# Patient Record
Sex: Female | Born: 1950 | Race: White | Hispanic: No | Marital: Married | Smoking: Former smoker
Health system: Southern US, Community
[De-identification: ages and names within clinical notes are randomized; demographics above are authoritative.]

## PROBLEM LIST (undated history)

## (undated) DIAGNOSIS — I10 Essential (primary) hypertension: Secondary | ICD-10-CM

## (undated) DIAGNOSIS — R079 Chest pain, unspecified: Secondary | ICD-10-CM

## (undated) DIAGNOSIS — I519 Heart disease, unspecified: Secondary | ICD-10-CM

## (undated) DIAGNOSIS — Z789 Other specified health status: Secondary | ICD-10-CM

## (undated) DIAGNOSIS — Q211 Atrial septal defect, unspecified: Secondary | ICD-10-CM

## (undated) DIAGNOSIS — F329 Major depressive disorder, single episode, unspecified: Secondary | ICD-10-CM

## (undated) DIAGNOSIS — F419 Anxiety disorder, unspecified: Secondary | ICD-10-CM

## (undated) DIAGNOSIS — E079 Disorder of thyroid, unspecified: Secondary | ICD-10-CM

## (undated) HISTORY — DX: Essential (primary) hypertension: I10

## (undated) HISTORY — DX: Disorder of thyroid, unspecified: E07.9

## (undated) HISTORY — PX: EXPLORATION POST OPERATIVE OPEN HEART: SHX5061

## (undated) HISTORY — PX: COLONOSCOPY WITH PROPOFOL: SHX5780

## (undated) HISTORY — DX: Major depressive disorder, single episode, unspecified: F32.9

## (undated) HISTORY — DX: Anxiety disorder, unspecified: F41.9

## (undated) HISTORY — DX: Atrial septal defect: Q21.1

## (undated) HISTORY — DX: Atrial septal defect, unspecified: Q21.10

## (undated) HISTORY — DX: Heart disease, unspecified: I51.9

## (undated) HISTORY — DX: Other specified health status: Z78.9

## (undated) HISTORY — DX: Chest pain, unspecified: R07.9

## (undated) HISTORY — PX: ATRIAL SEPTAL DEFECT(ASD) CLOSURE: CATH118299

---

## 2014-01-30 ENCOUNTER — Ambulatory Visit: Payer: Self-pay | Admitting: Family Medicine

## 2014-10-30 ENCOUNTER — Ambulatory Visit: Payer: Self-pay | Admitting: Cardiovascular Disease

## 2015-10-08 ENCOUNTER — Telehealth: Payer: Self-pay | Admitting: Family Medicine

## 2015-10-08 MED ORDER — LEVOTHYROXINE SODIUM 50 MCG PO TABS: 50.0000 ug | ORAL_TABLET | Freq: Every day | ORAL | Status: DC

## 2015-10-08 NOTE — Telephone Encounter (Signed)
Pt.called need a refile on Levothyroxine  50 tablet   [facility] [place]  Pt call back # is   [phone]

## 2015-10-08 NOTE — Telephone Encounter (Signed)
Last office visit 10/02/14 patient requesting refill on thyroid med.

## 2015-10-08 NOTE — Telephone Encounter (Signed)
Filled for 1 mos. Pt needs f/u appt for further refills. Thanks! AK

## 2015-11-25 ENCOUNTER — Ambulatory Visit (INDEPENDENT_AMBULATORY_CARE_PROVIDER_SITE_OTHER): Payer: BC Managed Care – PPO | Admitting: Family Medicine

## 2015-11-25 ENCOUNTER — Encounter: Payer: Self-pay | Admitting: Family Medicine

## 2015-11-25 ENCOUNTER — Other Ambulatory Visit: Payer: Self-pay | Admitting: Family Medicine

## 2015-11-25 VITALS — BP 152/86 | HR 64 | Temp 98.4°F | Resp 16 | Ht 67.0 in | Wt 224.6 lb

## 2015-11-25 DIAGNOSIS — Z124 Encounter for screening for malignant neoplasm of cervix: Secondary | ICD-10-CM | POA: Diagnosis not present

## 2015-11-25 DIAGNOSIS — F419 Anxiety disorder, unspecified: Secondary | ICD-10-CM

## 2015-11-25 DIAGNOSIS — E66812 Obesity, class 2: Secondary | ICD-10-CM | POA: Insufficient documentation

## 2015-11-25 DIAGNOSIS — E66811 Obesity, class 1: Secondary | ICD-10-CM | POA: Insufficient documentation

## 2015-11-25 DIAGNOSIS — I10 Essential (primary) hypertension: Secondary | ICD-10-CM | POA: Insufficient documentation

## 2015-11-25 DIAGNOSIS — R350 Frequency of micturition: Secondary | ICD-10-CM

## 2015-11-25 DIAGNOSIS — E785 Hyperlipidemia, unspecified: Secondary | ICD-10-CM | POA: Diagnosis not present

## 2015-11-25 DIAGNOSIS — Z01419 Encounter for gynecological examination (general) (routine) without abnormal findings: Secondary | ICD-10-CM | POA: Diagnosis not present

## 2015-11-25 DIAGNOSIS — F329 Major depressive disorder, single episode, unspecified: Secondary | ICD-10-CM | POA: Insufficient documentation

## 2015-11-25 DIAGNOSIS — E034 Atrophy of thyroid (acquired): Secondary | ICD-10-CM | POA: Diagnosis not present

## 2015-11-25 DIAGNOSIS — R030 Elevated blood-pressure reading, without diagnosis of hypertension: Secondary | ICD-10-CM

## 2015-11-25 DIAGNOSIS — Z789 Other specified health status: Secondary | ICD-10-CM | POA: Insufficient documentation

## 2015-11-25 DIAGNOSIS — E6609 Other obesity due to excess calories: Secondary | ICD-10-CM | POA: Insufficient documentation

## 2015-11-25 DIAGNOSIS — F32A Depression, unspecified: Secondary | ICD-10-CM

## 2015-11-25 DIAGNOSIS — Z1331 Encounter for screening for depression: Secondary | ICD-10-CM | POA: Insufficient documentation

## 2015-11-25 DIAGNOSIS — Z6836 Body mass index (BMI) 36.0-36.9, adult: Secondary | ICD-10-CM | POA: Insufficient documentation

## 2015-11-25 DIAGNOSIS — Z87898 Personal history of other specified conditions: Secondary | ICD-10-CM | POA: Insufficient documentation

## 2015-11-25 DIAGNOSIS — Z1239 Encounter for other screening for malignant neoplasm of breast: Secondary | ICD-10-CM | POA: Diagnosis not present

## 2015-11-25 DIAGNOSIS — J40 Bronchitis, not specified as acute or chronic: Secondary | ICD-10-CM | POA: Insufficient documentation

## 2015-11-25 DIAGNOSIS — IMO0001 Reserved for inherently not codable concepts without codable children: Secondary | ICD-10-CM

## 2015-11-25 DIAGNOSIS — J029 Acute pharyngitis, unspecified: Secondary | ICD-10-CM | POA: Insufficient documentation

## 2015-11-25 DIAGNOSIS — E038 Other specified hypothyroidism: Secondary | ICD-10-CM

## 2015-11-25 DIAGNOSIS — E039 Hypothyroidism, unspecified: Secondary | ICD-10-CM | POA: Insufficient documentation

## 2015-11-25 DIAGNOSIS — J209 Acute bronchitis, unspecified: Secondary | ICD-10-CM | POA: Insufficient documentation

## 2015-11-25 DIAGNOSIS — R55 Syncope and collapse: Secondary | ICD-10-CM | POA: Insufficient documentation

## 2015-11-25 HISTORY — DX: Other specified health status: Z78.9

## 2015-11-25 HISTORY — DX: Anxiety disorder, unspecified: F41.9

## 2015-11-25 HISTORY — DX: Depression, unspecified: F32.A

## 2015-11-25 LAB — POCT URINALYSIS DIPSTICK
Bilirubin, UA: NEGATIVE
Glucose, UA: NEGATIVE
KETONES UA: NEGATIVE
Leukocytes, UA: NEGATIVE
Nitrite, UA: NEGATIVE
PH UA: 5
PROTEIN UA: NEGATIVE
RBC UA: NEGATIVE
SPEC GRAV UA: 1.01
UROBILINOGEN UA: NEGATIVE

## 2015-11-25 MED ORDER — BUSPIRONE HCL 5 MG PO TABS: ORAL_TABLET | ORAL | Status: DC

## 2015-11-25 MED ORDER — LEVOTHYROXINE SODIUM 50 MCG PO TABS: 50.0000 ug | ORAL_TABLET | Freq: Every day | ORAL | Status: DC

## 2015-11-25 NOTE — Assessment & Plan Note (Signed)
Bp elevated today and at home. Discussed starting medication today vs. Getting anxiety under control and rechecking in 1 mos. Pt would like to try to control anxiety. Monitor closely at home. Alarm symptoms reviewed. Recheck 1 mos.  Check CMP.

## 2015-11-25 NOTE — Assessment & Plan Note (Signed)
Discussed starting a medication- pt would like to try buspar.Reviewed side effects and risks vs benefits.  Check TSH and vitamin D. Encouraged daily exercise. Recheck 1 mos.

## 2015-11-25 NOTE — Patient Instructions (Signed)
Check your blood pressure at home: Your goal blood pressure is 140/90 Work on low salt/sodium diet - goal <1.5gm (1,500mg ) per day. Eat a diet high in fruits/vegetables and whole grains.  Look into mediterranean and DASH diet. Goal activity is 1950min/wk of moderate intensity exercise.  This can be split into 30 minute chunks.  If you are not at this level, you can start with smaller 10-15 min increments and slowly build up activity. Look at www.heart.org for more resources  Please seek immediate medical attention at ER or Urgent Care if you develop: Chest pain, pressure or tightness. Shortness of breath accompanied by nausea or diaphoresis Visual changes Numbness or tingling on one side of the body Facial droop Altered mental status Or any concerning symptoms.

## 2015-11-25 NOTE — Progress Notes (Signed)
Subjective:    Patient ID: Meghan Cole, female    DOB: Dec 19, 1950, 65 y.o.   MRN: [id number]  HPI: Meghan RiisDana Tipping is a 65 y.o. female presenting on 11/25/2015 for Annual Exam   HPI  Pt presents for annual exam. Needs her thyroid renewed. Is worried about her anxiety- is more than it used to be. Feels she eats because she is bored. Will need pap smear. Has goals to lose weight.  Mammogram- needs to have done.  BP has been running high at home. Checks it at home; avg 150/80's. Is having HA at home. No CP. No SOB. No dizziness. Walks daily at the park. Would like to try and lose some weight- thinks it is anxiety related.  Pt is also concerned she might have a UTI today. No dysuria. Some back pain. Some frequency. No blood or foul smell to urine.   Past Medical History  Diagnosis Date  . Thyroid disease   . Heart disease    Social History   Social History  . Marital Status: Married    Spouse Name: N/A  . Number of Children: N/A  . Years of Education: N/A   Occupational History  . Not on file.   Social History Main Topics  . Smoking status: Never Smoker   . Smokeless tobacco: Not on file  . Alcohol Use: No  . Drug Use: No  . Sexual Activity: Not on file   Other Topics Concern  . Not on file   Social History Narrative  . No narrative on file   Family History  Problem Relation Age of Onset  . Cancer Mother     melanoma  . Heart disease Father    No current outpatient prescriptions on file prior to visit.   No current facility-administered medications on file prior to visit.    Review of Systems  Constitutional: Negative for fever and chills.  Respiratory: Negative for cough, chest tightness and wheezing.   Cardiovascular: Negative for chest pain and leg swelling.  Gastrointestinal: Negative for nausea, vomiting, abdominal pain, diarrhea and constipation.  Endocrine: Negative.  Negative for cold intolerance, heat intolerance, polydipsia, polyphagia and polyuria.    Genitourinary: Negative for dysuria, vaginal bleeding, vaginal discharge, difficulty urinating and vaginal pain.  Musculoskeletal: Negative.   Neurological: Positive for headaches. Negative for dizziness, light-headedness and numbness.  Psychiatric/Behavioral: Negative for suicidal ideas. The patient is nervous/anxious.    Per HPI unless specifically indicated above     Objective:    BP 152/86 mmHg  Pulse 64  Temp(Src) 98.4 F (36.9 C) (Oral)  Resp 16  Ht 5\' 7"  (1.702 m)  Wt 224 lb 9.6 oz (101.878 kg)  BMI 35.17 kg/m2  Wt Readings from Last 3 Encounters:  11/25/15 224 lb 9.6 oz (101.878 kg)    GAD 7 : Generalized Anxiety Score 11/25/2015  Nervous, Anxious, on Edge 2  Control/stop worrying 1  Worry too much - different things 1  Trouble relaxing 0  Restless 1  Easily annoyed or irritable 2  Afraid - awful might happen 2  Total GAD 7 Score 9  Anxiety Difficulty Somewhat difficult      Physical Exam  Constitutional: She is oriented to person, place, and time. She appears well-developed and well-nourished.  HENT:  Head: Normocephalic and atraumatic.  Neck: Neck supple.  Cardiovascular: Normal rate, regular rhythm and normal heart sounds.  Exam reveals no gallop and no friction rub.   No murmur heard. Pulmonary/Chest: Effort normal and breath sounds  normal. She has no wheezes. She exhibits no tenderness.  Abdominal: Soft. Normal appearance and bowel sounds are normal. She exhibits no distension and no mass. There is tenderness (mild to deep palpation.) in the right upper quadrant and right lower quadrant. There is no rebound, no guarding and no CVA tenderness.  Genitourinary: Vagina normal and uterus normal. No breast swelling, tenderness, discharge or bleeding. No labial fusion. There is no tenderness or lesion on the right labia. There is no tenderness or lesion on the left labia. Cervix exhibits no motion tenderness, no discharge and no friability. Right adnexum displays no  tenderness and no fullness. Left adnexum displays no tenderness and no fullness. No erythema or tenderness in the vagina. No vaginal discharge found.  Musculoskeletal: Normal range of motion. She exhibits no edema or tenderness.  Lymphadenopathy:    She has no cervical adenopathy.  Neurological: She is alert and oriented to person, place, and time.  Skin: Skin is warm and dry.  Psychiatric: She has a normal mood and affect. Her speech is normal and behavior is normal. Judgment and thought content normal. Cognition and memory are normal. She expresses no suicidal ideation. She expresses no suicidal plans.   No results found for this or any previous visit.    Assessment & Plan:   Problem List Items Addressed This Visit      Endocrine   Adult hypothyroidism    Check TSH. Renewed medications.       Relevant Medications   levothyroxine (SYNTHROID, LEVOTHROID) 50 MCG tablet   Other Relevant Orders   TSH   Comprehensive metabolic panel     Other   Anxiety    Discussed starting a medication- pt would like to try buspar.Reviewed side effects and risks vs benefits.  Check TSH and vitamin D. Encouraged daily exercise. Recheck 1 mos.       Relevant Medications   busPIRone (BUSPAR) 5 MG tablet   Other Relevant Orders   Vitamin D 1,25 dihydroxy   Elevated BP    Bp elevated today and at home. Discussed starting medication today vs. Getting anxiety under control and rechecking in 1 mos. Pt would like to try to control anxiety. Monitor closely at home. Alarm symptoms reviewed. Recheck 1 mos.  Check CMP.        Other Visit Diagnoses    Well woman exam with routine gynecological exam    -  Primary    Pap done. Health maintenance reviewed.     Relevant Orders    Pap IG and HPV (high risk) DNA detection    Mild hyperlipidemia        Relevant Orders    Lipid panel    Screening for breast cancer        Relevant Orders    MM Digital Screening    Screening for cervical cancer         Relevant Orders    Pap IG and HPV (high risk) DNA detection    Urinary frequency        UA clear. Likely post menopausal changes. Alarm symptoms reviewed wiht patient. Avoid caffeine. Return if not improving.     Relevant Orders    POCT urinalysis dipstick       Meds ordered this encounter  Medications  . Cranberry, Vacc oxycoccus, 200 MG CAPS    Sig: Take by mouth.  . Melatonin CR 3 MG TBCR    Sig: Take by mouth.  Satira Sark Johns Wort 1000 MG CAPS  Sig: Take by mouth daily.  Marland Kitchen levothyroxine (SYNTHROID, LEVOTHROID) 50 MCG tablet    Sig: Take 1 tablet (50 mcg total) by mouth daily.    Dispense:  90 tablet    Refill:  3    Pt needs appt for further refills.    Order Specific Question:  Supervising Provider    Answer:  Janeann Forehand [[id number]]  . busPIRone (BUSPAR) 5 MG tablet    Sig: Take 1 tablet by mouth once daily for 1 week and then increase to 1 tablet twice daily.    Dispense:  60 tablet    Refill:  11    Order Specific Question:  Supervising Provider    Answer:  Janeann Forehand [phone]      Follow up plan: Return in about 4 weeks (around 12/23/2015) for Bp check. Marland Kitchen

## 2015-11-25 NOTE — Assessment & Plan Note (Signed)
Check TSH. Renewed medications.

## 2015-11-26 LAB — PAP IG AND HPV HIGH-RISK: HPV DNA HIGH RISK: NOT DETECTED

## 2015-12-03 LAB — TSH: TSH: 1.08 u[IU]/mL (ref 0.450–4.500)

## 2015-12-03 LAB — COMPREHENSIVE METABOLIC PANEL
A/G RATIO: 1.5 (ref 1.2–2.2)
ALK PHOS: 63 IU/L (ref 39–117)
ALT: 55 IU/L — AB (ref 0–32)
AST: 60 IU/L — ABNORMAL HIGH (ref 0–40)
Albumin: 4.3 g/dL (ref 3.6–4.8)
BILIRUBIN TOTAL: 0.8 mg/dL (ref 0.0–1.2)
BUN/Creatinine Ratio: 17 (ref 12–28)
BUN: 12 mg/dL (ref 8–27)
CHLORIDE: 99 mmol/L (ref 96–106)
CO2: 24 mmol/L (ref 18–29)
Calcium: 9.9 mg/dL (ref 8.7–10.3)
Creatinine, Ser: 0.71 mg/dL (ref 0.57–1.00)
GFR calc non Af Amer: 92 mL/min/{1.73_m2} (ref >59)
GFR, EST AFRICAN AMERICAN: 106 mL/min/{1.73_m2} (ref >59)
GLUCOSE: 82 mg/dL (ref 65–99)
Globulin, Total: 2.9 g/dL (ref 1.5–4.5)
POTASSIUM: 4.9 mmol/L (ref 3.5–5.2)
Sodium: 140 mmol/L (ref 134–144)
TOTAL PROTEIN: 7.2 g/dL (ref 6.0–8.5)

## 2015-12-03 LAB — LIPID PANEL
CHOL/HDL RATIO: 2.2 ratio (ref 0.0–4.4)
Cholesterol, Total: 199 mg/dL (ref 100–199)
HDL: 89 mg/dL (ref >39)
LDL Calculated: 100 mg/dL — ABNORMAL HIGH (ref 0–99)
Triglycerides: 52 mg/dL (ref 0–149)
VLDL Cholesterol Cal: 10 mg/dL (ref 5–40)

## 2015-12-03 LAB — VITAMIN D 1,25 DIHYDROXY
VITAMIN D 1, 25 (OH) TOTAL: 46 pg/mL
VITAMIN D3 1, 25 (OH): 46 pg/mL

## 2015-12-11 ENCOUNTER — Ambulatory Visit
Admission: RE | Admit: 2015-12-11 | Discharge: 2015-12-11 | Disposition: A | Discharge status: Home / Self Care | Payer: BC Managed Care – PPO | Source: Ambulatory Visit | Attending: Family Medicine | Admitting: Family Medicine

## 2015-12-11 DIAGNOSIS — Z1231 Encounter for screening mammogram for malignant neoplasm of breast: Secondary | ICD-10-CM | POA: Insufficient documentation

## 2015-12-11 DIAGNOSIS — Z1239 Encounter for other screening for malignant neoplasm of breast: Secondary | ICD-10-CM

## 2015-12-23 ENCOUNTER — Encounter: Payer: Self-pay | Admitting: Family Medicine

## 2015-12-23 ENCOUNTER — Ambulatory Visit (INDEPENDENT_AMBULATORY_CARE_PROVIDER_SITE_OTHER): Payer: BC Managed Care – PPO | Admitting: Family Medicine

## 2015-12-23 VITALS — BP 140/80 | HR 72 | Temp 98.0°F | Resp 16 | Ht 67.0 in | Wt 222.0 lb

## 2015-12-23 DIAGNOSIS — IMO0001 Reserved for inherently not codable concepts without codable children: Secondary | ICD-10-CM

## 2015-12-23 DIAGNOSIS — E034 Atrophy of thyroid (acquired): Secondary | ICD-10-CM | POA: Diagnosis not present

## 2015-12-23 DIAGNOSIS — E038 Other specified hypothyroidism: Secondary | ICD-10-CM | POA: Diagnosis not present

## 2015-12-23 DIAGNOSIS — R748 Abnormal levels of other serum enzymes: Secondary | ICD-10-CM

## 2015-12-23 DIAGNOSIS — R030 Elevated blood-pressure reading, without diagnosis of hypertension: Secondary | ICD-10-CM

## 2015-12-23 DIAGNOSIS — F419 Anxiety disorder, unspecified: Secondary | ICD-10-CM

## 2015-12-23 NOTE — Addendum Note (Signed)
Addended by: Alease FrameARTER, Jenavi Beedle S on: 12/23/2015 12:07 PM   Modules accepted: Orders

## 2015-12-23 NOTE — Progress Notes (Signed)
Name: Meghan Cole   MRN: [id number]    DOB: 1950-08-03   Date:12/23/2015       Progress Note  Subjective  Chief Complaint  Chief Complaint  Patient presents with  . Hypertension    HPI Here to f/u BP.  It was elevated last visit.  BP has reduced to 135/75 range   She is walking in AM and taking an OTC herbal anxiety combination and seems to be handling stress well at this time./ No problem-specific assessment & plan notes found for this encounter.   Past Medical History  Diagnosis Date  . Thyroid disease   . Heart disease     Past Surgical History  Procedure Laterality Date  . Exploration post operative open heart      Family History  Problem Relation Age of Onset  . Cancer Mother     melanoma  . Heart disease Father   . Breast cancer Paternal Grandmother 79    Social History   Social History  . Marital Status: Married    Spouse Name: N/A  . Number of Children: N/A  . Years of Education: N/A   Occupational History  . Not on file.   Social History Main Topics  . Smoking status: Never Smoker   . Smokeless tobacco: Never Used  . Alcohol Use: No  . Drug Use: No  . Sexual Activity: Not on file   Other Topics Concern  . Not on file   Social History Narrative     Current outpatient prescriptions:  .  levothyroxine (SYNTHROID, LEVOTHROID) 50 MCG tablet, Take 50 mcg by mouth daily before breakfast., Disp: , Rfl:  .  Melatonin CR 3 MG TBCR, Take by mouth., Disp: , Rfl:  .  St Johns Wort 1000 MG CAPS, Take by mouth daily., Disp: , Rfl:   Not on File   Review of Systems  Constitutional: Negative for fever, chills, weight loss and malaise/fatigue.  HENT: Negative for hearing loss.   Eyes: Negative for blurred vision and double vision.  Respiratory: Negative for cough, shortness of breath and wheezing.   Cardiovascular: Positive for palpitations (once about 1 week ago). Negative for chest pain and leg swelling.  Gastrointestinal: Negative for heartburn,  nausea, abdominal pain and blood in stool.  Genitourinary: Negative for dysuria, urgency and frequency.  Musculoskeletal: Negative for myalgias and joint pain.  Skin: Negative for rash.  Neurological: Negative for dizziness, tremors, weakness and headaches.      Objective  Filed Vitals:   12/23/15 1104 12/23/15 1148  BP: 138/79 140/80  Pulse: 72   Temp: 98 F (36.7 C)   TempSrc: Oral   Resp: 16   Height:  (1.702 m)   Weight: 222 lb (100.699 kg)     Physical Exam  Constitutional: She is oriented to person, place, and time and well-developed, well-nourished, and in no distress. No distress.  HENT:  Head: Normocephalic and atraumatic.  Eyes: Conjunctivae and EOM are normal. Pupils are equal, round, and reactive to light. No scleral icterus.  Neck: Normal range of motion. Neck supple. Carotid bruit is not present. No thyromegaly present.  Cardiovascular: Normal rate, regular rhythm and normal heart sounds.  Exam reveals no gallop and no friction rub.   No murmur heard. Pulmonary/Chest: Effort normal and breath sounds normal. No respiratory distress. She has no wheezes. She has no rales.  Abdominal: Soft. Bowel sounds are normal. She exhibits no distension and no mass. There is no tenderness.  Musculoskeletal: She exhibits  no edema.  Lymphadenopathy:    She has no cervical adenopathy.  Neurological: She is alert and oriented to person, place, and time.  Vitals reviewed.      Recent Results (from the past 2160 hour(s))  Pap IG and HPV (high risk) DNA detection     Status: None   Collection Time: 11/25/15 11:09 AM  Result Value Ref Range   HPV DNA High Risk Not Detected     Comment: HIGH RISK HPV types (16,18,31,33,35,39,45,51,52,56,58,59,66,68) were not detected. Other HPV types which cause anogenital lesions may be present. The significance of the other types of HPV in malignant  processes has not been established.                  ** Normal Reference Range: Not  Detected **      HPV High Risk testing performed using the APTIMA HPV mRNA Assay.      Specimen adequacy:      Comment: SATISFACTORY.  Endocervical/transformation zone component present.   FINAL DIAGNOSIS:      Comment: - NEGATIVE FOR INTRAEPITHELIAL LESIONS OR MALIGNANCY.    COMMENTS:      Comment: This Pap test has been evaluated with computer assisted technology. Atrophy is present.    Cytotechnologist:      Comment: JWW, BS CT(ASCP), MLT(ASCP) *  The Pap is a screening test for cervical cancer. It is not a  diagnostic test and is subject to false negative and false positive  results. It is most reliable when a satisfactory sample, regularly  obtained, is submitted with relevant clinical findings and history,  and when the Pap result is evaluated along with historic and current  clinical information.   POCT urinalysis dipstick     Status: Normal   Collection Time: 11/25/15  3:32 PM  Result Value Ref Range   Color, UA amber    Clarity, UA clear    Glucose, UA negative    Bilirubin, UA negative    Ketones, UA negative    Spec Grav, UA 1.010    Blood, UA negative    pH, UA 5.0    Protein, UA negative    Urobilinogen, UA negative    Nitrite, UA negative    Leukocytes, UA Negative Negative  Lipid panel     Status: Abnormal   Collection Time: 11/28/15  8:45 AM  Result Value Ref Range   Cholesterol, Total 199 100 - 199 mg/dL   Triglycerides 52 0 - 149 mg/dL   HDL 89 >16>39 mg/dL   VLDL Cholesterol Cal 10 5 - 40 mg/dL   LDL Calculated 109100 (H) 0 - 99 mg/dL   Chol/HDL Ratio 2.2 0.0 - 4.4 ratio units    Comment:                                   T. Chol/HDL Ratio                                             Men  Women                               1/2 Avg.Risk  3.4    3.3  Avg.Risk  5.0    4.4                                2X Avg.Risk  9.6    7.1                                3X Avg.Risk 23.4   11.0   TSH     Status: None   Collection  Time: 11/28/15  8:45 AM  Result Value Ref Range   TSH 1.080 0.450 - 4.500 uIU/mL  Vitamin D 1,25 dihydroxy     Status: None   Collection Time: 11/28/15  8:45 AM  Result Value Ref Range   Vitamin D 1, 25 (OH)2 Total 46 pg/mL    Comment: Reference Range: Adults: 21 - 65    Vitamin D2 1, 25 (OH)2 <10 pg/mL   Vitamin D3 1, 25 (OH)2 46 pg/mL  Comprehensive metabolic panel     Status: Abnormal   Collection Time: 11/28/15  8:45 AM  Result Value Ref Range   Glucose 82 65 - 99 mg/dL   BUN 12 8 - 27 mg/dL   Creatinine, Ser 1.61 0.57 - 1.00 mg/dL   GFR calc non Af Amer 92 >59 mL/min/1.73   GFR calc Af Amer 106 >59 mL/min/1.73   BUN/Creatinine Ratio 17 12 - 28   Sodium 140 134 - 144 mmol/L   Potassium 4.9 3.5 - 5.2 mmol/L   Chloride 99 96 - 106 mmol/L   CO2 24 18 - 29 mmol/L   Calcium 9.9 8.7 - 10.3 mg/dL   Total Protein 7.2 6.0 - 8.5 g/dL   Albumin 4.3 3.6 - 4.8 g/dL   Globulin, Total 2.9 1.5 - 4.5 g/dL   Albumin/Globulin Ratio 1.5 1.2 - 2.2   Bilirubin Total 0.8 0.0 - 1.2 mg/dL   Alkaline Phosphatase 63 39 - 117 IU/L   AST 60 (H) 0 - 40 IU/L   ALT 55 (H) 0 - 32 IU/L     Assessment & Plan  Problem List Items Addressed This Visit      Endocrine   Adult hypothyroidism   Relevant Medications   levothyroxine (SYNTHROID, LEVOTHROID) 50 MCG tablet     Other   Anxiety   Elevated BP - Primary   Elevated liver enzymes   Relevant Orders   Hepatic function panel      Meds ordered this encounter  Medications  . levothyroxine (SYNTHROID, LEVOTHROID) 50 MCG tablet    Sig: Take 50 mcg by mouth daily before breakfast.   1. Elevated BP -140/80 today RTC-6 months  2. Anxiety   3. Hypothyroidism due to acquired atrophy of thyroid Cont Levothyroxine  4. Elevated liver enzymes  - Hepatic function panel

## 2015-12-24 LAB — HEPATIC FUNCTION PANEL
ALBUMIN: 4.3 g/dL (ref 3.6–4.8)
ALT: 52 IU/L — ABNORMAL HIGH (ref 0–32)
AST: 53 IU/L — AB (ref 0–40)
Alkaline Phosphatase: 64 IU/L (ref 39–117)
BILIRUBIN TOTAL: 0.5 mg/dL (ref 0.0–1.2)
Bilirubin, Direct: 0.18 mg/dL (ref 0.00–0.40)
Total Protein: 7.3 g/dL (ref 6.0–8.5)

## 2015-12-26 LAB — HEPATITIS C ANTIBODY

## 2015-12-26 LAB — SPECIMEN STATUS REPORT

## 2015-12-27 ENCOUNTER — Encounter: Payer: Self-pay | Admitting: Family Medicine

## 2015-12-30 ENCOUNTER — Encounter: Payer: Self-pay | Admitting: Family Medicine

## 2015-12-30 ENCOUNTER — Other Ambulatory Visit: Payer: Self-pay | Admitting: Family Medicine

## 2015-12-30 DIAGNOSIS — R768 Other specified abnormal immunological findings in serum: Secondary | ICD-10-CM

## 2016-01-21 ENCOUNTER — Ambulatory Visit (INDEPENDENT_AMBULATORY_CARE_PROVIDER_SITE_OTHER): Payer: BC Managed Care – PPO | Admitting: Family Medicine

## 2016-01-21 ENCOUNTER — Encounter: Payer: Self-pay | Admitting: Family Medicine

## 2016-01-21 VITALS — BP 133/79 | HR 66 | Temp 98.0°F | Resp 16 | Ht 67.0 in | Wt 219.0 lb

## 2016-01-21 DIAGNOSIS — R350 Frequency of micturition: Secondary | ICD-10-CM | POA: Diagnosis not present

## 2016-01-21 DIAGNOSIS — N3000 Acute cystitis without hematuria: Secondary | ICD-10-CM | POA: Diagnosis not present

## 2016-01-21 MED ORDER — NITROFURANTOIN MONOHYD MACRO 100 MG PO CAPS: 100.0000 mg | ORAL_CAPSULE | Freq: Two times a day (BID) | ORAL | 0 refills | Status: AC

## 2016-01-21 NOTE — Progress Notes (Signed)
Name: Meghan Cole   MRN: [id number]    DOB: [birth year]   Date:01/21/2016       Progress Note  Subjective  Chief Complaint  Chief Complaint  Patient presents with  . Urinary Tract Infection    HPI C/o dysuria, freq., pressure in bladder x  5 dayhs.  Some mild low back pain today.  Some foul odor.  No fever, N, V.  No problem-specific Assessment & Plan notes found for this encounter.   Past Medical History:  Diagnosis Date  . Heart disease   . Thyroid disease     Social History  Substance Use Topics  . Smoking status: Never Smoker  . Smokeless tobacco: Never Used  . Alcohol use No     Current Outpatient Prescriptions:  .  milk thistle 175 MG tablet, Take 175 mg by mouth daily., Disp: , Rfl:  .  Probiotic Product (PROBIOTIC-10) CAPS, Take 1 capsule by mouth daily., Disp: , Rfl:  .  levothyroxine (SYNTHROID, LEVOTHROID) 50 MCG tablet, Take 50 mcg by mouth daily before breakfast., Disp: , Rfl:  .  Melatonin CR 3 MG TBCR, Take by mouth., Disp: , Rfl:   Not on File  Review of Systems  Constitutional: Negative for chills, fever and malaise/fatigue.  Eyes: Negative.   Respiratory: Negative.   Cardiovascular: Negative.   Gastrointestinal: Negative.   Genitourinary: Positive for dysuria, frequency and urgency. Negative for flank pain and hematuria.  Musculoskeletal: Negative.   Skin: Negative.   Neurological: Negative.  Negative for weakness.      Objective  Vitals:   01/21/16 0957  BP: 133/79  Pulse: 66  Resp: 16  Temp: 98 F (36.7 C)  TempSrc: Oral  Weight: 219 lb (99.3 kg)  Height: 5\' 7"  (1.702 m)     Physical Exam  Constitutional: She is oriented to person, place, and time. No distress.  HENT:  Head: Normocephalic and atraumatic.  Cardiovascular: Normal rate, regular rhythm and normal heart sounds.   Pulmonary/Chest: Effort normal.  Abdominal: Bowel sounds are normal. She exhibits no distension and no mass. There is no tenderness.  No C VA  tenderness  Musculoskeletal: She exhibits no edema.  Neurological: She is oriented to person, place, and time.  Vitals reviewed.     Recent Results (from the past 2160 hour(s))  Pap IG and HPV (high risk) DNA detection     Status: None   Collection Time: 11/25/15 11:09 AM  Result Value Ref Range   HPV DNA High Risk Not Detected     Comment: HIGH RISK HPV types (16,18,31,33,35,39,45,51,52,56,58,59,66,68) were not detected. Other HPV types which cause anogenital lesions may be present. The significance of the other types of HPV in malignant  processes has not been established.                  ** Normal Reference Range: Not Detected **      HPV High Risk testing performed using the APTIMA HPV mRNA Assay.      Specimen adequacy:      Comment: SATISFACTORY.  Endocervical/transformation zone component present.   FINAL DIAGNOSIS:      Comment: - NEGATIVE FOR INTRAEPITHELIAL LESIONS OR MALIGNANCY.    COMMENTS:      Comment: This Pap test has been evaluated with computer assisted technology. Atrophy is present.    Cytotechnologist:      Comment: JWW, BS CT(ASCP), MLT(ASCP) *  The Pap is a screening test for cervical cancer. It is not a  diagnostic  test and is subject to false negative and false positive  results. It is most reliable when a satisfactory sample, regularly  obtained, is submitted with relevant clinical findings and history,  and when the Pap result is evaluated along with historic and current  clinical information.   POCT urinalysis dipstick     Status: Normal   Collection Time: 11/25/15  3:32 PM  Result Value Ref Range   Color, UA amber    Clarity, UA clear    Glucose, UA negative    Bilirubin, UA negative    Ketones, UA negative    Spec Grav, UA 1.010    Blood, UA negative    pH, UA 5.0    Protein, UA negative    Urobilinogen, UA negative    Nitrite, UA negative    Leukocytes, UA Negative Negative  Lipid panel     Status: Abnormal   Collection Time:  11/28/15  8:45 AM  Result Value Ref Range   Cholesterol, Total 199 100 - 199 mg/dL   Triglycerides 52 0 - 149 mg/dL   HDL 89 >95>39 mg/dL   VLDL Cholesterol Cal 10 5 - 40 mg/dL   LDL Calculated 284100 (H) 0 - 99 mg/dL   Chol/HDL Ratio 2.2 0.0 - 4.4 ratio units    Comment:                                   T. Chol/HDL Ratio                                             Men  Women                               1/2 Avg.Risk  3.4    3.3                                   Avg.Risk  5.0    4.4                                2X Avg.Risk  9.6    7.1                                3X Avg.Risk 23.4   11.0   TSH     Status: None   Collection Time: 11/28/15  8:45 AM  Result Value Ref Range   TSH 1.080 0.450 - 4.500 uIU/mL  Vitamin D 1,25 dihydroxy     Status: None   Collection Time: 11/28/15  8:45 AM  Result Value Ref Range   Vitamin D 1, 25 (OH)2 Total 46 pg/mL    Comment: Reference Range: Adults: 21 - 65    Vitamin D2 1, 25 (OH)2 <10 pg/mL   Vitamin D3 1, 25 (OH)2 46 pg/mL  Comprehensive metabolic panel     Status: Abnormal   Collection Time: 11/28/15  8:45 AM  Result Value Ref Range   Glucose 82 65 - 99 mg/dL   BUN 12 8 - 27 mg/dL   Creatinine, Ser 1.320.71  0.57 - 1.00 mg/dL   GFR calc non Af Amer 92 >59 mL/min/1.73   GFR calc Af Amer 106 >59 mL/min/1.73   BUN/Creatinine Ratio 17 12 - 28   Sodium 140 134 - 144 mmol/L   Potassium 4.9 3.5 - 5.2 mmol/L   Chloride 99 96 - 106 mmol/L   CO2 24 18 - 29 mmol/L   Calcium 9.9 8.7 - 10.3 mg/dL   Total Protein 7.2 6.0 - 8.5 g/dL   Albumin 4.3 3.6 - 4.8 g/dL   Globulin, Total 2.9 1.5 - 4.5 g/dL   Albumin/Globulin Ratio 1.5 1.2 - 2.2   Bilirubin Total 0.8 0.0 - 1.2 mg/dL   Alkaline Phosphatase 63 39 - 117 IU/L   AST 60 (H) 0 - 40 IU/L   ALT 55 (H) 0 - 32 IU/L  Hepatic function panel     Status: Abnormal   Collection Time: 12/23/15  2:43 PM  Result Value Ref Range   Total Protein 7.3 6.0 - 8.5 g/dL   Albumin 4.3 3.6 - 4.8 g/dL   Bilirubin Total 0.5  0.0 - 1.2 mg/dL   Bilirubin, Direct 1.61 0.00 - 0.40 mg/dL   Alkaline Phosphatase 64 39 - 117 IU/L   AST 53 (H) 0 - 40 IU/L   ALT 52 (H) 0 - 32 IU/L  Hepatitis C antibody     Status: Abnormal   Collection Time: 12/23/15  2:43 PM  Result Value Ref Range   Hep C Virus Ab >11.0 (H) 0.0 - 0.9 s/co ratio    Comment:                                   Negative:     < 0.8                              Indeterminate: 0.8 - 0.9                                   Positive:     > 0.9  The CDC recommends that a positive HCV antibody result  be followed up with a HCV Nucleic Acid Amplification  test (096045).   Specimen status report     Status: None   Collection Time: 12/23/15  2:43 PM  Result Value Ref Range   specimen status report Comment     Comment: Written Authorization Written Authorization Written Authorization Received. Authorization received from [facility] 12-26-2015 Logged by Frances Furbish      Assessment & Plan  1. Urinary frequency  - Urine Culture - POCT Urinalysis Dipstick  2. Acute cystitis without hematuria  - nitrofurantoin, macrocrystal-monohydrate, (MACROBID) 100 MG capsule; Take 1 capsule (100 mg total) by mouth 2 (two) times daily.  Dispense: 14 capsule; Refill: 0

## 2016-01-22 LAB — URINE CULTURE

## 2016-02-10 ENCOUNTER — Ambulatory Visit: Payer: BC Managed Care – PPO | Admitting: Gastroenterology

## 2016-02-11 ENCOUNTER — Ambulatory Visit (INDEPENDENT_AMBULATORY_CARE_PROVIDER_SITE_OTHER): Payer: BC Managed Care – PPO | Admitting: Gastroenterology

## 2016-02-11 ENCOUNTER — Other Ambulatory Visit: Payer: Self-pay | Admitting: Gastroenterology

## 2016-02-11 ENCOUNTER — Encounter: Payer: Self-pay | Admitting: Gastroenterology

## 2016-02-11 ENCOUNTER — Other Ambulatory Visit: Payer: Self-pay

## 2016-02-11 VITALS — BP 152/85 | HR 73 | Temp 97.9°F | Ht 67.0 in | Wt 219.0 lb

## 2016-02-11 DIAGNOSIS — B182 Chronic viral hepatitis C: Secondary | ICD-10-CM | POA: Diagnosis not present

## 2016-02-11 DIAGNOSIS — R748 Abnormal levels of other serum enzymes: Secondary | ICD-10-CM | POA: Diagnosis not present

## 2016-02-11 DIAGNOSIS — R768 Other specified abnormal immunological findings in serum: Secondary | ICD-10-CM

## 2016-02-11 NOTE — Patient Instructions (Signed)
Please go to the lab and have your labs drawn. We will call you with the details to your ultrasound appointment. Please call us if you have any questions.

## 2016-02-11 NOTE — Progress Notes (Signed)
Gastroenterology Consultation  Referring Provider:     Janeann Forehand., MD Primary Care Physician:  Fidel Levy, MD Primary Gastroenterologist:  Dr. Servando Snare     Reason for Consultation:     Hepatitis C        HPI:   Meghan Cole is a 65 y.o. y/o female referred for consultation & management of Hepatitis C by Dr. Fidel Levy, MD.  This patient was sent to me today for evaluation of a hepatitis C antibody being positive with a history of abnormal liver enzymes.  The patient denies any high risk activities such as IV drug use snorting cocaine or home me tattoos.  She does report that she was given multiple transfusions as a baby when she had cardiac surgery for VSD.  The patient denies any nausea vomiting fevers or chills.  She does report that she has intermittent dyspepsia and some itching around her right upper quadrant.  There is no report of any unexplained weight loss.  The patient states she is married and has children but they have not been tested for hepatitis C.  Past Medical History:  Diagnosis Date  . Heart disease   . Thyroid disease     Past Surgical History:  Procedure Laterality Date  . COLONOSCOPY WITH PROPOFOL     2011  . EXPLORATION POST OPERATIVE OPEN HEART      Prior to Admission medications   Medication Sig Start Date End Date Taking? Authorizing Provider  levothyroxine (SYNTHROID, LEVOTHROID) 50 MCG tablet Take 50 mcg by mouth daily before breakfast.   Yes Historical Provider, MD  Melatonin CR 3 MG TBCR Take by mouth.   Yes Historical Provider, MD  milk thistle 175 MG tablet Take 175 mg by mouth daily.   Yes Historical Provider, MD  Probiotic Product (PROBIOTIC-10) CAPS Take 1 capsule by mouth daily.   Yes Historical Provider, MD    Family History  Problem Relation Age of Onset  . Cancer Mother     melanoma  . Heart disease Father   . Breast cancer Paternal Grandmother 8     Social History  Substance Use Topics  . Smoking status: Never  Smoker  . Smokeless tobacco: Never Used  . Alcohol use No    Allergies as of 02/11/2016  . (No Known Allergies)    Review of Systems:    All systems reviewed and negative except where noted in HPI.   Physical Exam:  BP (!) 152/85 (BP Location: Left Arm, Patient Position: Sitting)   Pulse 73   Temp 97.9 F (36.6 C) (Oral)   Ht 5\' 7"  (1.702 m)   Wt 219 lb (99.3 kg)   BMI 34.30 kg/m  No LMP recorded. Patient is postmenopausal. Psych:  Alert and cooperative. Normal mood and affect. General:   Alert,  Well-developed, well-nourished, pleasant and cooperative in NAD Head:  Normocephalic and atraumatic. Eyes:  Sclera clear, no icterus.   Conjunctiva pink. Ears:  Normal auditory acuity. Nose:  No deformity, discharge, or lesions. Mouth:  No deformity or lesions,oropharynx pink & moist. Neck:  Supple; no masses or thyromegaly. Lungs:  Respirations even and unlabored.  Clear throughout to auscultation.   No wheezes, crackles, or rhonchi. No acute distress. Heart:  Regular rate and rhythm; no murmurs, clicks, rubs, or gallops. Abdomen:  Normal bowel sounds.  No bruits.  Soft, non-tender and non-distended without masses, hepatosplenomegaly or hernias noted.  No guarding or rebound tenderness.  Negative Carnett sign.  Rectal:  Deferred.  Msk:  Symmetrical without gross deformities.  Good, equal movement & strength bilaterally. Pulses:  Normal pulses noted. Extremities:  No clubbing or edema.  No cyanosis. Neurologic:  Alert and oriented x3;  grossly normal neurologically. Skin:  Intact without significant lesions or rashes.  No jaundice. Lymph Nodes:  No significant cervical adenopathy. Psych:  Alert and cooperative. Normal mood and affect.  Imaging Studies: No results found.  Assessment and Plan:   Meghan Cole is a 65 y.o. y/o female Who has a history of a blood transfusion when she was a baby.  She reports that she believes she had multiple blood transfusions at that time.  The  patient has not had any other risk factors for hepatitis C.  The patient will have her labs sent off for hepatitis C genotype and viral load.  She will also be set up for lab work to check for other possible causes of abnormal liver enzymes.  She will also have an ultrasound with a fibrosis scan sent off.  The patient will then be treated according to the results of these tests.  The patient has been explained the plan and agrees with it.   Note: This dictation was prepared with Dragon dictation along with smaller phrase technology. Any transcriptional errors that result from this process are unintentional.

## 2016-02-12 ENCOUNTER — Other Ambulatory Visit: Payer: Self-pay | Admitting: Gastroenterology

## 2016-02-14 ENCOUNTER — Ambulatory Visit
Admission: RE | Admit: 2016-02-14 | Discharge: 2016-02-14 | Disposition: A | Discharge status: Home / Self Care | Payer: BC Managed Care – PPO | Source: Ambulatory Visit | Attending: Gastroenterology | Admitting: Gastroenterology

## 2016-02-14 DIAGNOSIS — R894 Abnormal immunological findings in specimens from other organs, systems and tissues: Secondary | ICD-10-CM | POA: Insufficient documentation

## 2016-02-14 DIAGNOSIS — N9489 Other specified conditions associated with female genital organs and menstrual cycle: Secondary | ICD-10-CM | POA: Insufficient documentation

## 2016-02-14 DIAGNOSIS — R768 Other specified abnormal immunological findings in serum: Secondary | ICD-10-CM

## 2016-02-14 DIAGNOSIS — N281 Cyst of kidney, acquired: Secondary | ICD-10-CM | POA: Insufficient documentation

## 2016-02-14 DIAGNOSIS — R932 Abnormal findings on diagnostic imaging of liver and biliary tract: Secondary | ICD-10-CM | POA: Diagnosis not present

## 2016-02-17 LAB — ALPHA-1-ANTITRYPSIN: A-1 Antitrypsin: 147 mg/dL (ref 90–200)

## 2016-02-17 LAB — HEPATITIS C GENOTYPE

## 2016-02-17 LAB — MITOCHONDRIAL ANTIBODIES: MITOCHONDRIAL AB: 8 U (ref 0.0–20.0)

## 2016-02-17 LAB — HCV REALTIME ABBOTT
HCV log10: 5.726 log10 IU/mL
Hepatitis C Quantitation: 532535 IU/mL

## 2016-02-17 LAB — HEPATITIS A ANTIBODY, TOTAL: HEP A TOTAL AB: POSITIVE — AB

## 2016-02-17 LAB — HEPATITIS B SURFACE ANTIGEN: HEP B S AG: NEGATIVE

## 2016-02-17 LAB — IGG, IGA, IGM
IGM (IMMUNOGLOBULIN M), SRM: 149 mg/dL (ref 26–217)
IgA/Immunoglobulin A, Serum: 108 mg/dL (ref 87–352)
IgG (Immunoglobin G), Serum: 1285 mg/dL (ref 700–1600)

## 2016-02-17 LAB — CERULOPLASMIN: Ceruloplasmin: 26 mg/dL (ref 19.0–39.0)

## 2016-02-17 LAB — ANA: ANA TITER 1: NEGATIVE

## 2016-02-17 LAB — HEPATITIS B SURFACE ANTIBODY,QUALITATIVE: Hep B Surface Ab, Qual: NONREACTIVE

## 2016-02-17 LAB — ANTI-SMOOTH MUSCLE ANTIBODY, IGG: Smooth Muscle Ab: 24 Units — ABNORMAL HIGH (ref 0–19)

## 2016-02-17 LAB — AFP TUMOR MARKER: AFP TUMOR MARKER: 9.4 ng/mL — AB (ref 0.0–8.3)

## 2016-02-21 ENCOUNTER — Telehealth: Payer: Self-pay

## 2016-02-21 NOTE — Telephone Encounter (Signed)
-----   Message from Midge Miniumarren Wohl, MD sent at 02/16/2016  4:41 PM EDT ----- Let the patient know that the fibrosis scan showed minimal fibrosis.

## 2016-02-21 NOTE — Telephone Encounter (Signed)
-----   Message from Midge Miniumarren Wohl, MD sent at 02/16/2016  4:42 PM EDT ----- Let the patient know the Ultrasound showed fatty liver.

## 2016-02-21 NOTE — Telephone Encounter (Signed)
Pt has been notified of US/Tissue elastography and lab results. Pt will be set up for Hep C treatment. Pt is going on a trip and has requested I wait to submit form on 03/19/16.

## 2016-02-21 NOTE — Telephone Encounter (Signed)
-----   Message from Midge Miniumarren Wohl, MD sent at 02/20/2016  7:20 PM EDT ----- The patient know that her Blood work showed her to be immune to hepatitis A but she needs vaccination for hepatitis B. Her tumor marker was slightly elevated and should be checked again in 3 months.  The patient's hepatitis C viral load was positive and she should be treated for her disease.

## 2016-02-21 NOTE — Telephone Encounter (Signed)
-----   Message from Midge Miniumarren Wohl, MD sent at 02/17/2016  1:40 PM EDT ----- Let the patient know that she needs a vaccination for hepatitis B. The patient's alpha-fetoprotein was slightly high and she should have this repeated in one month.

## 2016-02-28 ENCOUNTER — Telehealth: Payer: Self-pay

## 2016-02-28 NOTE — Telephone Encounter (Signed)
Contacted [employer] and was told by customer service rep, Burman Nieves that pt has a $1500.00 deductible of which has not been met. She has a 20% co insurance that she would have to pay out of pocket if her deductible has not been met.   Contacted pt about this information and she stated she has received a letter from [facility] stating the will not cover the tissue elastography. Pt will have her husband scan this letter and email to me so I may contact [place].

## 2016-02-28 NOTE — Telephone Encounter (Signed)
Patient wants to speak with Ginger. [employer] contacted the patient and stated that they will not pay for the Elastography part to her U/S that was ordered to check her liver. Please contact the patient back

## 2016-02-28 NOTE — Telephone Encounter (Signed)
Contacted [employer] Claims review dept and spoke with Marge regarding denial letter pt just received. She stated this claim was still under process as they just received it on 02/19/16. Pt has been notified of this information and will let me know if she receives a bill.

## 2016-03-24 ENCOUNTER — Telehealth: Payer: Self-pay

## 2016-03-24 NOTE — Telephone Encounter (Signed)
Paperwork completed for Hep C and faxed to [facility]. Pt is aware this was faxed today. Waiting on approval.

## 2016-05-29 ENCOUNTER — Telehealth: Payer: Self-pay | Admitting: Gastroenterology

## 2016-05-29 NOTE — Telephone Encounter (Signed)
Patient called to let you know that she finished her Harvoni. Does she needs labs? Appointment? Please let her know.

## 2016-06-03 ENCOUNTER — Other Ambulatory Visit: Payer: Self-pay

## 2016-06-03 DIAGNOSIS — B182 Chronic viral hepatitis C: Secondary | ICD-10-CM

## 2016-06-03 NOTE — Telephone Encounter (Signed)
Sent pt an email regarding repeating labs prior to appointment.

## 2016-06-13 LAB — HEPATIC FUNCTION PANEL
ALBUMIN: 4 g/dL (ref 3.6–4.8)
ALT: 18 IU/L (ref 0–32)
AST: 22 IU/L (ref 0–40)
Alkaline Phosphatase: 68 IU/L (ref 39–117)
BILIRUBIN TOTAL: 0.5 mg/dL (ref 0.0–1.2)
Bilirubin, Direct: 0.13 mg/dL (ref 0.00–0.40)
TOTAL PROTEIN: 7.1 g/dL (ref 6.0–8.5)

## 2016-06-18 ENCOUNTER — Telehealth: Payer: Self-pay

## 2016-06-18 NOTE — Telephone Encounter (Signed)
Pt notified of lab results

## 2016-06-18 NOTE — Telephone Encounter (Signed)
-----   Message from Midge Miniumarren Wohl, MD sent at 06/15/2016 12:28 PM EST ----- Let the patient know her liver enzymes came back to normal.

## 2016-06-22 ENCOUNTER — Ambulatory Visit: Payer: BC Managed Care – PPO | Admitting: Gastroenterology

## 2016-06-22 ENCOUNTER — Other Ambulatory Visit: Payer: Self-pay | Admitting: Gastroenterology

## 2016-06-23 LAB — HCV RT-PCR, QUANT (NON-GRAPH)

## 2016-06-26 ENCOUNTER — Telehealth: Payer: Self-pay

## 2016-06-26 NOTE — Telephone Encounter (Signed)
-----   Message from Midge Miniumarren Wohl, MD sent at 06/23/2016 12:33 PM EST ----- Let the patient know that her hepatitis C viral load was negative and she should be checked again in 6 months and in a year.

## 2016-06-26 NOTE — Telephone Encounter (Signed)
Pt notified of lab results

## 2016-06-29 ENCOUNTER — Ambulatory Visit: Payer: BC Managed Care – PPO | Admitting: Family Medicine

## 2016-06-30 ENCOUNTER — Ambulatory Visit (INDEPENDENT_AMBULATORY_CARE_PROVIDER_SITE_OTHER): Payer: BC Managed Care – PPO | Admitting: Gastroenterology

## 2016-06-30 ENCOUNTER — Encounter: Payer: Self-pay | Admitting: Gastroenterology

## 2016-06-30 VITALS — BP 181/86 | HR 65 | Temp 98.3°F | Ht 67.0 in | Wt 240.0 lb

## 2016-06-30 DIAGNOSIS — B1920 Unspecified viral hepatitis C without hepatic coma: Secondary | ICD-10-CM

## 2016-06-30 NOTE — Progress Notes (Signed)
   Primary Care Physician: Fidel LevyJames Hawkins Jr, MD  Primary Gastroenterologist:  Dr. Midge Miniumarren Gordan Grell  No chief complaint on file.   HPI: Meghan RiisDana Cole is a 66 y.o. female here for follow-up after treatment for her hepatitis C. The patient had recent labs that showed her hepatitis C viral load to be negative. The patient states that she had headaches and fatigue while taking the medication. She also was noted to be immune to hepatitis A but her hepatitis B antibody was negative. The patient states she has had boosters in the past and has been told by [facility] that she is a nonresponder to the vaccination.  Current Outpatient Prescriptions  Medication Sig Dispense Refill  . b complex vitamins tablet Take 1 tablet by mouth daily.    Marland Kitchen. levothyroxine (SYNTHROID, LEVOTHROID) 50 MCG tablet Take 50 mcg by mouth daily before breakfast.    . Melatonin CR 3 MG TBCR Take by mouth.    . milk thistle 175 MG tablet Take 175 mg by mouth daily.    . Probiotic Product (PROBIOTIC-10) CAPS Take 1 capsule by mouth daily.     No current facility-administered medications for this visit.     Allergies as of 06/30/2016  . (No Known Allergies)    ROS:  General: Negative for anorexia, weight loss, fever, chills, fatigue, weakness. ENT: Negative for hoarseness, difficulty swallowing , nasal congestion. CV: Negative for chest pain, angina, palpitations, dyspnea on exertion, peripheral edema.  Respiratory: Negative for dyspnea at rest, dyspnea on exertion, cough, sputum, wheezing.  GI: See history of present illness. GU:  Negative for dysuria, hematuria, urinary incontinence, urinary frequency, nocturnal urination.  Endo: Negative for unusual weight change.    Physical Examination:   BP (!) 181/86   Pulse 65   Temp 98.3 F (36.8 C) (Oral)   Ht 5\' 7"  (1.702 m)   Wt 240 lb (108.9 kg)   BMI 37.59 kg/m   General: Well-nourished, well-developed in no acute distress.  Eyes: No icterus. Conjunctivae pink. Mouth:  Oropharyngeal mucosa moist and pink , no lesions erythema or exudate. Lungs: Clear to auscultation bilaterally. Non-labored. Heart: Regular rate and rhythm, no murmurs rubs or gallops.  Abdomen: Bowel sounds are normal, nontender, nondistended, no hepatosplenomegaly or masses, no abdominal bruits or hernia , no rebound or guarding.   Extremities: No lower extremity edema. No clubbing or deformities. Neuro: Alert and oriented x 3.  Grossly intact. Skin: Warm and dry, no jaundice.   Psych: Alert and cooperative, normal mood and affect.  Labs:    Imaging Studies: No results found.  Assessment and Plan:   Meghan Cole is a 66 y.o. y/o female who comes in today for follow-up after having completed treatment for her hepatitis C. The patient's viral load is negative. The patient will be checked again in 6 months and a year for sustained viral response. The patient has been explained the plan and agrees with it.    Midge Miniumarren Morrell Fluke, MD. Clementeen GrahamFACG   Note: This dictation was prepared with Dragon dictation along with smaller phrase technology. Any transcriptional errors that result from this process are unintentional.

## 2016-07-01 ENCOUNTER — Ambulatory Visit: Payer: BC Managed Care – PPO | Admitting: Family Medicine

## 2016-07-05 ENCOUNTER — Encounter: Payer: Self-pay | Admitting: Family Medicine

## 2016-07-07 ENCOUNTER — Ambulatory Visit: Payer: BC Managed Care – PPO | Admitting: Family Medicine

## 2016-07-07 LAB — HCV RT-PCR, QUANT (NON-GRAPH)

## 2016-07-09 ENCOUNTER — Telehealth: Payer: Self-pay

## 2016-07-09 NOTE — Telephone Encounter (Signed)
Pt has been notified of lab result.

## 2016-07-09 NOTE — Telephone Encounter (Signed)
-----   Message from Midge Miniumarren Wohl, MD sent at 07/09/2016 10:36 AM EST ----- Let the patient know that her hepatitis C viral load was negative

## 2016-08-04 ENCOUNTER — Encounter: Payer: Self-pay | Admitting: Family Medicine

## 2016-08-04 ENCOUNTER — Ambulatory Visit (INDEPENDENT_AMBULATORY_CARE_PROVIDER_SITE_OTHER): Payer: BC Managed Care – PPO | Admitting: Family Medicine

## 2016-08-04 VITALS — BP 162/86 | HR 60 | Temp 97.9°F | Resp 16 | Ht 67.0 in | Wt 242.0 lb

## 2016-08-04 DIAGNOSIS — Z9229 Personal history of other drug therapy: Secondary | ICD-10-CM

## 2016-08-04 DIAGNOSIS — R635 Abnormal weight gain: Secondary | ICD-10-CM | POA: Diagnosis not present

## 2016-08-04 DIAGNOSIS — E039 Hypothyroidism, unspecified: Secondary | ICD-10-CM

## 2016-08-04 DIAGNOSIS — I10 Essential (primary) hypertension: Secondary | ICD-10-CM

## 2016-08-04 DIAGNOSIS — E669 Obesity, unspecified: Secondary | ICD-10-CM

## 2016-08-04 MED ORDER — HYDROCHLOROTHIAZIDE 12.5 MG PO CAPS: 12.5000 mg | ORAL_CAPSULE | Freq: Every day | ORAL | 5 refills | Status: DC

## 2016-08-04 NOTE — Patient Instructions (Signed)
Thank you for coming in to clinic today.  1. Diagnosed with HTN - Start new med HCTZ 12.5mg  daily in morning - Check BP outside office, record readings, first week daily, different times, if after 1-2 weeks persistently >140/90, then notify office to increase dose up to 25mg  daily, will send new rx if needed  2. Check labs FASTING at [facility] anytime in next few days - Check A1c screening for diabetes, Thyroid labs  Stay tuned for apt from nutritionist, they will call you [facility]   Address: [address], [place], [place] [address] Phone: [phone]  For Hep B vaccine, it should be fine to not repeat it, however we will look into answer regarding appropriate recommendation on repeating booster series of 3   Diet Recommendations for Low Carb  Limit Starchy (carb) foods include: Bread, rice, pasta, potatoes, corn, crackers, bagels, muffins, all baked goods.   Protein foods include: Meat, fish, poultry, eggs, dairy foods, and beans such as pinto and kidney beans (beans also provide carbohydrate).   1. Eat at least 3 meals and 1-2 snacks per day. Never go more than 4-5 hours while awake without eating.   2. Limit starchy foods to TWO per meal and ONE per snack. ONE portion of a starchy  food is equal to the following:   - ONE slice of bread (or its equivalent, such as half of a hamburger bun).   - 1/2 cup of a "scoopable" starchy food such as potatoes or rice.   - 1 OUNCE (28 grams) of starchy snacks (crackers or pretzels, look on label).   - 15 grams of carbohydrate as shown on food label.   3. Both lunch and dinner should include a protein food, a carb food, and vegetables.   - Obtain twice as many veg's as protein or carbohydrate foods for both lunch and dinner.   - Try to keep frozen veg's on hand for a quick vegetable serving.     - Fresh or frozen veg's are best.   4. Breakfast should always include protein.    CALL OR SEND US MESSAGE TO ORDER FASTING LABS FOR  [facility]  You will be due for FASTING BLOOD WORK (no food or drink after midnight before, only water or coffee without cream/sugar on the morning of)  For Lab Results, once available within 2-3 days of blood draw, you can can log in to MyChart online to view your results and a brief explanation. Also, we can discuss results at next follow-up visit.  Please schedule a follow-up appointment with Wilhelmina McardleLauren Kennedy NP in 3-4 months AFTER 11/18/16 for Annual Physical  If you have any other questions or concerns, please feel free to call the clinic or send a message through MyChart. You may also schedule an earlier appointment if necessary.  Saralyn PilarAlexander Karamalegos, DO [facility]

## 2016-08-04 NOTE — Progress Notes (Signed)
Subjective:    Patient ID: Meghan Cole, female    DOB: [birth year], 66 y.o.   MRN: [id number]  Meghan Cole is a 66 y.o. female presenting on 08/04/2016 for Hypertension (also wants to talk about Hep B vaccine)   HPI  H/o Hepatitis C s/p anti-viral therapy - Resolved (remission) / Non-responder Hep B Vaccine - Reviews recent course with prior dx Hep C, established with Gastroenterology Dr Servando Snare 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, however she had tested negative in past for Hep B surface Antibody (we do not have this record available in Epic), she was advised by [facility] in past that she was a "non-responder" and not indicated to repeat vaccination series, she is asking today about recommendation on this, her GI advised her that she could get repeat vaccination series, she is not sure if she wants it, and no longer does clinical work, 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 Attributes some to this otherwise concerned about this weight gain. - Asking about Naltrexone for reduced appetite, also considering Contrave,  never on any wt management medications in past - Has not seen Nutritionist formally before, interested in this option  CHRONIC HTN, New diagnosis Reports for several years she has elevated borderline blood pressure mostly SBP 130s when checks at home, but at other office visits SBP up to 150-180 on chart review. Also states pulse seems lower than usual, per chart review pulse ranges in 60s-70s Current Meds - No anti-HTN meds (she tried some of husband's HCTZ and tolerated it well) Lifestyle: - See above - Family history mother with idiopathic HTN - Admits posterior mild headache, woke up with this AM, previously had worsening HA on harvoni, now somewhat improved Denies CP, dyspnea, edema, dizziness / lightheadedness   Social History  Substance Use Topics  . Smoking status: Former Games developer  . Smokeless tobacco: Never Used  . Alcohol use No    Review of Systems Per HPI unless specifically indicated above     Objective:    BP (!) 162/86 (BP Location: Left Arm, Cuff Size: Normal)   Pulse 60   Temp 97.9 F (36.6 C) (Oral)   Resp 16   Ht 5\' 7"  (1.702 m)   Wt 242 lb (109.8 kg)   BMI 37.90 kg/m   Wt Readings from Last 3 Encounters:  08/04/16 242 lb (109.8 kg)  06/30/16 240 lb (108.9 kg)  02/11/16 219 lb (99.3 kg)    Physical Exam  Constitutional: She appears well-developed and well-nourished. No distress.  Well-appearing, comfortable, cooperative  HENT:  Head: Normocephalic and atraumatic.  Mouth/Throat: Oropharynx is clear and moist.  Eyes: Conjunctivae are normal.  Neck: Normal range of motion. Neck supple. No thyromegaly present.  Cardiovascular: Normal rate, regular rhythm, normal heart sounds and intact distal pulses.   No murmur heard. Pulmonary/Chest: Effort normal and breath sounds normal. No respiratory distress. She has no wheezes. She has no rales.  Musculoskeletal: Normal range of motion. She exhibits no edema or tenderness.  Lymphadenopathy:    She has no  cervical adenopathy.  Neurological: She is alert.  Skin: Skin is warm and dry. No rash noted. She is not diaphoretic. No erythema.  Some varicose / spider veins lower extremity  Psychiatric: She has a normal mood and affect. Her behavior is normal.  Well groomed, good eye contact, normal speech and thoughts  Nursing note and vitals reviewed.  Results for orders placed or performed in visit on 06/22/16  HCV RT-PCR, Quant (Non-Graph)  Result Value Ref Range   HCV Quantitative Comment Copies/mL   HCV Quantitative Log CANCELED Log10copy/mL   HCV QUANTITATIVE BASELINE Less than 2 IU/mL. IU/mL   IU log10 CANCELED Log10 IU/mL   PCR AMPLIFICATION+DETECTION Comment    TEST INFORMATION Comment       Assessment & Plan:   Problem List Items Addressed This Visit    Obesity (BMI 35.0-39.9 without comorbidity)    Recent wt gain 220 to 240 lbs in 5 months, attributed to inc appetite after harvoni therapy Elevated BP now new dx HTN. No prior A1c screening.  Plan: 1. Encouraged healthy lifestyle modifications, continue work on exercise with walking, gradually increase tolerance 2. Referral to [facility] for nutritionist 3. Check labs now BMET, TSH, Free T4, A1c 4. 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, works as Charity fundraiserN) last booster Hep B series x 3 vaccines in 1990s, with reported non response. - Discussion with GI recently 06/2016 about this concern, now s/p Harvoni for Hep C and  history of alcohol abuse  Plan: 1. Discussion with patient briefly today on this concern, options given such as consider a "non-responder" and not proceed with vaccine, patient seems most agreeable to this option. Otherwise, can consider repeat booster series x 3 vaccines or even high dose vaccine instead of 10mcg can increase to 40mcg vaccine x1 or up to x3 vaccines (0 months, 1 month, 6 month interval) can test Hep B surface antibody titer after 1 month. - Notified patient of these options to consider for future visit, and if chooses for vaccine we can order if needed - Also one option is to add Hep B surface antibody titer now to confirm this      Essential hypertension - Primary    Considered new dx HTN, still elevated BP today, SBP 150-160s without improvement on manual re-check No known complications  Plan: 1. Start new anti-HTN therapy - HCTZ 12.5mg  daily, counseling on HTN management, benefits, side effects 2. Encouraged start checking BP regularly on new med, write down readings, follow-up sooner if persistently >140/90 may  need increase dose to 25mg  daily 3. Check future labs [facility] - BMET chemistry to trend Cr and electrolytes prior to start thiazide, screening A1c, along with TSH, Free T4 for thyroid (wt gain) 4. Encouraged healthy lifestyle, referral to Nutritionist 5. Follow-up 3 months Annual Physical, can consider repeat labs at that time for electrolytes while on thiazide      Relevant Medications   hydrochlorothiazide (MICROZIDE) 12.5 MG capsule   Other Relevant Orders   Hemoglobin A1c   Basic Metabolic Panel (BMET)   Amb ref to Medical Nutrition Therapy-MNT   Adult hypothyroidism    Stable, chronic problem on levothyroxine. Concern with recent wt gain Last TSH 11/2015 with 1.080  Plan: 1. Continue current dose Levothyroxine daily 2. Check future TSH and Free T4 due to wt gain, adjust dose accordingly 3. Follow-up as needed, if stable can check TSH yearly       Relevant Orders   T4, free   TSH   Abnormal weight gain    Unclear exact etiology, seems may be due to increased appetite either naturally with poor dietary choices and recently reduced exercise, seems to attribute this to side effect of Harvoni. - See other A&P Obesity - Check labs, referral to nutrition - Follow-up, future consider wt management meds if needed       Relevant Orders   Hemoglobin A1c   Amb ref to Medical Nutrition Therapy-MNT      Meds ordered this encounter  Medications  . doxycycline (VIBRAMYCIN) 50 MG capsule    Sig: as needed.   . hydrochlorothiazide (MICROZIDE) 12.5 MG capsule    Sig: Take 1 capsule (12.5 mg total) by mouth daily.    Dispense:  30 capsule    Refill:  5      Follow up plan: Return in about 3 months (around 11/01/2016) for Annual Physical.  Saralyn Pilar, DO [facility] 08/04/2016, 3:07 PM

## 2016-08-04 NOTE — Assessment & Plan Note (Signed)
Considered new dx HTN, still elevated BP today, SBP 150-160s without improvement on manual re-check No known complications  Plan: 1. Start new anti-HTN therapy - HCTZ 12.5mg  daily, counseling on HTN management, benefits, side effects 2. Encouraged start checking BP regularly on new med, write down readings, follow-up sooner if persistently >140/90 may need increase dose to 25mg  daily 3. Check future labs [facility] - BMET chemistry to trend Cr and electrolytes prior to start thiazide, screening A1c, along with TSH, Free T4 for thyroid (wt gain) 4. Encouraged healthy lifestyle, referral to Nutritionist 5. Follow-up 3 months Annual Physical, can consider repeat labs at that time for electrolytes while on thiazide

## 2016-08-04 NOTE — Assessment & Plan Note (Addendum)
Stable, chronic problem on levothyroxine. Concern with recent wt gain Last TSH 11/2015 with 1.080  Plan: 1. Continue current dose Levothyroxine 50mcg daily 2. Check future TSH and Free T4 due to wt gain, adjust dose accordingly 3. Follow-up as needed, if stable can check TSH yearly

## 2016-08-04 NOTE — Assessment & Plan Note (Signed)
Recent wt gain 220 to 240 lbs in 5 months, attributed to inc appetite after harvoni therapy Elevated BP now new dx HTN. No prior A1c screening.  Plan: 1. Encouraged healthy lifestyle modifications, continue work on exercise with walking, gradually increase tolerance 2. Referral to [facility] nutritionist 3. Check labs now BMET, TSH, Free T4, A1c 4. Follow-up as planned 3 months for annual physical, check wt - discussed options such as naltrexone for appetite reduction vs contrave (wellbutrin+naltrexone for wt management), these are possible options if wt is refractory to lifestyle changes, can discuss in future

## 2016-08-04 NOTE — Assessment & Plan Note (Signed)
No recent lab results available with Hep B Surface Antibody to confirm non response, but reported by patient ([facility], works as [employer]) last booster Hep B series x 3 vaccines in 1990s, with reported non response. - Discussion with GI recently 06/2016 about this concern, now s/p Harvoni for Hep C and history of alcohol abuse  Plan: 1. Discussion with patient briefly today on this concern, options given such as consider a "non-responder" and not proceed with vaccine, patient seems most agreeable to this option. Otherwise, can consider repeat booster series x 3 vaccines or even high dose vaccine instead of 10mcg can increase to 40mcg vaccine x1 or up to x3 vaccines (0 months, 1 month, 6 month interval) can test Hep B surface antibody titer after 1 month. - Notified patient of these options to consider for future visit, and if chooses for vaccine we can order if needed - Also one option is to add Hep B surface antibody titer now to confirm this

## 2016-08-04 NOTE — Assessment & Plan Note (Signed)
Unclear exact etiology, seems may be due to increased appetite either naturally with poor dietary choices and recently reduced exercise, seems to attribute this to side effect of Harvoni. - See other A&P Obesity - Check labs, referral to nutrition - Follow-up, future consider wt management meds if needed

## 2016-08-06 LAB — BASIC METABOLIC PANEL
BUN/Creatinine Ratio: 21 (ref 12–28)
BUN: 16 mg/dL (ref 8–27)
CALCIUM: 10.1 mg/dL (ref 8.7–10.3)
CHLORIDE: 96 mmol/L (ref 96–106)
CO2: 26 mmol/L (ref 18–29)
Creatinine, Ser: 0.78 mg/dL (ref 0.57–1.00)
GFR calc non Af Amer: 81 mL/min/{1.73_m2} (ref >59)
GFR, EST AFRICAN AMERICAN: 94 mL/min/{1.73_m2} (ref >59)
Glucose: 84 mg/dL (ref 65–99)
Potassium: 4.4 mmol/L (ref 3.5–5.2)
Sodium: 138 mmol/L (ref 134–144)

## 2016-08-06 LAB — HEMOGLOBIN A1C
Est. average glucose Bld gHb Est-mCnc: 97 mg/dL
Hgb A1c MFr Bld: 5 % (ref 4.8–5.6)

## 2016-08-06 LAB — TSH: TSH: 1.83 u[IU]/mL (ref 0.450–4.500)

## 2016-08-06 LAB — T4, FREE: Free T4: 1.38 ng/dL (ref 0.82–1.77)

## 2016-08-07 ENCOUNTER — Encounter: Payer: Self-pay | Admitting: Family Medicine

## 2016-08-07 DIAGNOSIS — I10 Essential (primary) hypertension: Secondary | ICD-10-CM

## 2016-08-07 MED ORDER — HYDROCHLOROTHIAZIDE 25 MG PO TABS: 12.5000 mg | ORAL_TABLET | Freq: Every day | ORAL | 3 refills | Status: DC

## 2016-08-11 LAB — SPECIMEN STATUS REPORT

## 2016-08-11 LAB — HEPATITIS B SURFACE ANTIBODY, QUANTITATIVE: Hepatitis B Surf Ab Quant: 4.4 m[IU]/mL — ABNORMAL LOW (ref >9.9)

## 2016-08-11 LAB — HEPATITIS B SURFACE ANTIBODY,QUALITATIVE: HEP B SURFACE AB, QUAL: NONREACTIVE

## 2016-08-26 ENCOUNTER — Ambulatory Visit: Payer: BC Managed Care – PPO | Admitting: Dietician

## 2016-09-16 ENCOUNTER — Encounter: Payer: BC Managed Care – PPO | Attending: Family Medicine | Admitting: Dietician

## 2016-09-16 ENCOUNTER — Encounter: Payer: Self-pay | Admitting: Dietician

## 2016-09-16 VITALS — Ht 67.0 in | Wt 235.2 lb

## 2016-09-16 DIAGNOSIS — Z6836 Body mass index (BMI) 36.0-36.9, adult: Secondary | ICD-10-CM

## 2016-09-16 DIAGNOSIS — E6609 Other obesity due to excess calories: Secondary | ICD-10-CM

## 2016-09-16 DIAGNOSIS — I10 Essential (primary) hypertension: Secondary | ICD-10-CM | POA: Diagnosis not present

## 2016-09-16 NOTE — Patient Instructions (Signed)
   Increase low-carb vegetables during the day, aim for 3 or more servings daily.   Include 2 or more servings of fruit daily.   Try a protein drink at lunchtime, along with a fruit or 2-3 whole graham crackers.

## 2016-09-16 NOTE — Progress Notes (Signed)
Medical Nutrition Therapy: Visit start time: 1330  end time: 1430  Assessment:  Diagnosis: HTN, obesity Past medical history: hypothyroidism, GERD Psychosocial issues/ stress concerns: patient reports high stress level, incorporated healthy coping strategies.  Preferred learning method:  . No preference indicated  Current weight: 235.2lbs  Height: 5'7" Medications, supplements: reconciled list in medical record  Progress and evaluation: Patient reports goal of weight loss; she has experienced weight gain of 23lbs since last year, mostly during time of treatment for Hep C. She has had some history of weight fluctuation, but seems to be unable to lose below 218lbs. She lost 80lbs several years ago when following high protein low carb diet but regained that weight, and does not feel she could resume that type of diet again. Currently she feels her main issue is afternoon snacking on sweets, mostly cookies -- homemade oatmeal raisin. She sometimes eats large numbers of these cookies in one day, likely due to boredom.    Physical activity: walking, core exercises 30 minutes, daily  Dietary Intake:  Usual eating pattern includes 2-3 meals and 1-2 snacks per day. Dining out frequency: 2 meals per week.  Breakfast: usually 9-10am poached egg on toast, 1c. coffee Snack: none Lunch: 1pm sandwich or homemade cookies Snack: 3-4pm orange and/or cookies Supper: sometimes skips if not hungry, or just cereal. Or balanced meal with protein and vegetables/ salad. 09/1016: grilled pork chop, spinach, sweet potato Snack: none Beverages: water, decaf hot tea sometimes with Splenda, usually unsweetened    Nutrition Care Education: Topics covered: weight control, HTN Basic nutrition: basic food groups, appropriate nutrient balance using plate method and food models, appropriate meal and snack schedule Weight control: benefits of weight control, strategies for controlling cravings for sweets, managing food  portions, role of protein and carbohydrate in weight management, options for balance, light meals and snacks; discussed alternative snacks for cookies.  Advanced nutrition: food label reading for carbohydrate Hypertension: identifying food sources of potassium, magnesium   Nutritional Diagnosis:  Watson-3.3 Overweight/obesity As related to excess calories.  As evidenced by BMI 36.  Intervention: Instruction as noted above.   Set goals with direction from patient.      Education Materials given:  . Food lists/ Planning A Balanced Meal . Sample meal pattern/ menus . Snacking handout . Goals/ instructions  Learner/ who was taught:  . Patient   Level of understanding: Marland Kitchen Verbalizes/ demonstrates competency  Demonstrated degree of understanding via:   Teach back Learning barriers: . None  Willingness to learn/ readiness for change: . Eager, change in progress  Monitoring and Evaluation:  Dietary intake, exercise, and body weight      follow up: 10/19/16

## 2016-09-17 ENCOUNTER — Encounter: Payer: Self-pay | Admitting: Dietician

## 2016-09-17 NOTE — Progress Notes (Signed)
Patient sent an email message to cancel her follow-up appointment for 10/19/16. She did not wish to reschedule. Sent letter to MD.

## 2016-09-20 ENCOUNTER — Encounter: Payer: Self-pay | Admitting: Family Medicine

## 2016-09-20 DIAGNOSIS — R002 Palpitations: Secondary | ICD-10-CM

## 2016-09-20 DIAGNOSIS — R000 Tachycardia, unspecified: Secondary | ICD-10-CM

## 2016-09-21 NOTE — Addendum Note (Signed)
Addended by: Smitty Cords on: 09/21/2016 04:33 PM   Modules accepted: Orders

## 2016-10-04 NOTE — Progress Notes (Signed)
Cardiology Office Note  Date:  10/06/2016   ID:  Meghan Cole, DOB 12-06-1950, MRN [id number]  PCP:  Saralyn Pilar, DO   Chief Complaint  Patient presents with  . OTHER    Irregular heart beat. Meds reviewed verbally with pt.    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 Malachi Paradise  For consultation of her irregular heartbeat She is a retired Engineer, civil (consulting)  She reports having tachycardia, irregular rhythm Once a month Feels it on the left side of her chest underneath the left breast Symptoms will last for several hours When she has arrhythmia she feels very tired Using her blood pressure cuff heart rate measured any 120 range Also has low blood pressure during these episodes She does not have any medication to take for her symptoms Typically will rest and symptoms will eventually resolve Reports symptoms are very uncomfortable Symptoms dating back several months now  She does report having weight gain  mostly during time of treatment for Hep C.   She denies any significant shortness of breath or chest pain No significant leg edema, no orthostasis  EKG personally reviewed by myself on todays visit Shows normal sinus rhythm with rate 72 bpm no significant ST-T wave changes  PMH:   has a past medical history of Atrial septal defect; Heart disease; Hypertension; and Thyroid disease.  PSH:    Past Surgical History:  Procedure Laterality Date  . ATRIAL SEPTAL DEFECT(ASD) CLOSURE    . COLONOSCOPY WITH PROPOFOL     2011  . EXPLORATION POST OPERATIVE OPEN HEART      Current Outpatient Prescriptions  Medication Sig Dispense Refill  . aspirin EC 81 MG tablet Take 81 mg by mouth daily.    Marland Kitchen b complex vitamins tablet  Take 1 tablet by mouth daily.    Marland Kitchen doxycycline (VIBRAMYCIN) 50 MG capsule as needed.     Burman Blacksmith FIBER SUPPLEMENT PO Take by mouth.    . hydrochlorothiazide (HYDRODIURIL) 25 MG tablet Take 0.5 tablets (12.5 mg total) by mouth daily. 45 tablet 3  . levothyroxine (SYNTHROID, LEVOTHROID) 50 MCG tablet Take 50 mcg by mouth daily before breakfast.    . Melatonin ER 5 MG TBCR Take 1 tablet by mouth at bedtime.     . Multiple Vitamin (MULTIVITAMIN) tablet Take 1 tablet by mouth daily.    Marland Kitchen diltiazem (CARDIZEM) 30 MG tablet Take 1 tablet (30 mg total) by mouth 3 (three) times daily as needed. 90 tablet 6  . metoprolol tartrate (LOPRESSOR) 25 MG tablet Take 1 tablet (25 mg total) by mouth 2 (two) times daily as needed. 60 tablet 3   No current facility-administered medications for this visit.      Allergies:   Patient has no known allergies.   Social History:  The patient  reports that she has quit smoking. She has never used smokeless tobacco. She reports that she does not drink alcohol or use drugs.   Family History:   family history includes Breast cancer (age of onset: 5) in her paternal grandmother; Cancer in her mother; Heart attack in her father; Heart disease in her father.    Review of Systems: Review of Systems  Constitutional: Negative.  Respiratory: Negative.   Cardiovascular: Positive for palpitations.       Tachycardia  Gastrointestinal: Negative.   Musculoskeletal: Negative.   Neurological: Negative.   Psychiatric/Behavioral: Negative.   All other systems reviewed and are negative.    PHYSICAL EXAM: VS:  BP 140/80 (BP Location: Right Arm, Patient Position: Sitting, Cuff Size: Large)   Pulse 72   Ht  (1.575 m)   Wt 236 lb 12 oz (107.4 kg)   BMI 43.30 kg/m  , BMI Body mass index is 43.3 kg/m. GEN: Well nourished, well developed, in no acute distress  HEENT: normal  Neck: no JVD, carotid bruits, or masses Cardiac: RRR; no murmurs, rubs, or gallops,no edema   Respiratory:  clear to auscultation bilaterally, normal work of breathing GI: soft, nontender, nondistended, + BS MS: no deformity or atrophy  Skin: warm and dry, no rash Neuro:  Strength and sensation are intact Psych: euthymic mood, full affect    Recent Labs: 06/12/2016: ALT 18 08/05/2016: BUN 16; Creatinine, Ser 0.78; Potassium 4.4; Sodium 138; TSH 1.830    Lipid Panel Lab Results  Component Value Date   CHOL 199 11/28/2015   HDL 89 11/28/2015   LDLCALC 100 (H) 11/28/2015   TRIG 52 11/28/2015      Wt Readings from Last 3 Encounters:  10/06/16 236 lb 12 oz (107.4 kg)  09/16/16 235 lb 3.2 oz (106.7 kg)  08/04/16 242 lb (109.8 kg)       ASSESSMENT AND PLAN:  Irregular heart beat - Long discussion with her concerning her symptoms She reports irregular rhythm as detailed above concerning for atrial fibrillation Various types of monitoring and testing discussed with her in detail After long discussion, she prefers to wear event monitor for one month We spent time discussing various types of medications that could be used to address her arrhythmia. We have suggested she take metoprolol 25 mg as needed Followed by diltiazem 30 mg pill if needed to convert rhythm back to normal  Essential hypertension - Plan: EKG 12-Lead, Cardiac event monitor Currently reports blood pressure well controlled on low-dose HCTZ She does have dietary intake of potassium Reports blood pressure 130s at home No further medication changes made  Obesity (BMI 35.0-39.9 without comorbidity) We have encouraged continued exercise, careful diet management in an effort to lose weight.  Patient was seen in consultation for memory care physician and will be referred back for ongoing care of the issues detailed above  Disposition:   F/U  6 weeks for follow-up of monitor   Orders Placed This Encounter  Procedures  . Cardiac event monitor  . EKG 12-Lead     Signed, Dossie Arbour, M.D.,  Ph.D. 10/06/2016  [facility] [place], [place] [phone]

## 2016-10-06 ENCOUNTER — Telehealth: Payer: Self-pay | Admitting: Cardiovascular Disease

## 2016-10-06 ENCOUNTER — Ambulatory Visit (INDEPENDENT_AMBULATORY_CARE_PROVIDER_SITE_OTHER): Payer: BC Managed Care – PPO | Admitting: Cardiovascular Disease

## 2016-10-06 ENCOUNTER — Encounter: Payer: Self-pay | Admitting: Cardiovascular Disease

## 2016-10-06 VITALS — BP 140/80 | HR 72 | Ht 62.0 in | Wt 236.8 lb

## 2016-10-06 DIAGNOSIS — I499 Cardiac arrhythmia, unspecified: Secondary | ICD-10-CM | POA: Diagnosis not present

## 2016-10-06 DIAGNOSIS — I10 Essential (primary) hypertension: Secondary | ICD-10-CM | POA: Diagnosis not present

## 2016-10-06 DIAGNOSIS — E669 Obesity, unspecified: Secondary | ICD-10-CM

## 2016-10-06 MED ORDER — DILTIAZEM HCL 30 MG PO TABS: 30.0000 mg | ORAL_TABLET | Freq: Three times a day (TID) | ORAL | 6 refills | Status: DC | PRN

## 2016-10-06 MED ORDER — RIVAROXABAN 20 MG PO TABS: 20.0000 mg | ORAL_TABLET | Freq: Every day | ORAL | 6 refills | Status: DC

## 2016-10-06 MED ORDER — METOPROLOL TARTRATE 25 MG PO TABS: 25.0000 mg | ORAL_TABLET | Freq: Two times a day (BID) | ORAL | 3 refills | Status: DC | PRN

## 2016-10-06 NOTE — Telephone Encounter (Signed)
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

## 2016-10-06 NOTE — Telephone Encounter (Signed)
Patient went to ems station to have ekg done and this was faxed per patient she took the 2 medications she was told to take during an episode.    Placed fax in nurse box

## 2016-10-06 NOTE — Patient Instructions (Addendum)
Medication Instructions:   If you have episodes of atrial fibrillation  Please take metoprolol and diltiazem pill as needed  If you continue in arrhythmia after 2 hours, Take another diltiazem Followed by another metoprolol several hours  Labwork:  No new labs needed  Testing/Procedures:  We will order an event monitor for atrial fibrillation, irregular rhythm Event monitors are medical devices that record the heart's electrical activity. Doctors most often [place] these monitors to diagnose arrhythmias. Arrhythmias are problems with the speed or rhythm of the heartbeat. The monitor is a small, portable device. You can wear one while you do your normal daily activities. This is usually used to diagnose what is causing palpitations/syncope (passing out).  [employer] will call you today to verify your address before they ship the monitor to your home.   It is very important that you answer this call.  Once you receive the monitor, please call the 1-800 # on the box.  The representative will walk you through placement of the monitor and activate it.    Follow-Up: It was a pleasure seeing you in the office today. Please call us if you have new issues that need to be addressed before your next appt.  [phone] Your physician wants you to follow-up in: 6 weeks  If you need a refill on your cardiac medications before your next appointment, please call your pharmacy.   Cardiac Event Monitoring A cardiac event monitor is a small recording device that is used to detect abnormal heart rhythms (arrhythmias). The monitor is used to record your heart rhythm when you have symptoms, such as:  Fast heartbeats (palpitations), such as heart racing or fluttering.  Dizziness.  Fainting or light-headedness.  Unexplained weakness. Some monitors are wired to electrodes placed on your chest. Electrodes are flat, sticky disks that attach to your skin. Other monitors may be hand-held or worn on the  wrist. The monitor can be worn for up to 30 days. If the monitor is attached to your chest, a technician will prepare your chest for the electrode placement and show you how to work the monitor. Take time to practice using the monitor before you leave the office. Make sure you understand how to send the information from the monitor to your health care provider. In some cases, you may need to use a landline telephone instead of a cell phone. What are the risks? Generally, this device is safe to use, but it possible that the skin under the electrodes will become irritated. How to use your cardiac event monitor  Wear your monitor at all times, except when you are in water:  Do not let the monitor get wet.  Take the monitor off when you bathe. Do not swim or use a hot tub with it on.  Keep your skin clean. Do not put body lotion or moisturizer on your chest.  Change the electrodes as told by your health care provider or any time they stop sticking to your skin. You may need to use medical tape to keep them on.  Try to put the electrodes in slightly different places on your chest to help prevent skin irritation. They must remain in the area under your left breast and in the upper right section of your chest.  Make sure the monitor is safely clipped to your clothing or in a location close to your body that your health care provider recommends.  Press the button to record as soon as you feel heart-related symptoms, such as:  Dizziness.  Weakness.  Light-headedness.  Palpitations.  Thumping or pounding in your chest.  Shortness of breath.  Unexplained weakness.  Keep a diary of your activities, such as walking, doing chores, and taking medicine. It is very important to note what you were doing when you pushed the button to record your symptoms. This will help your health care provider determine what might be contributing to your symptoms.  Send the recorded information as recommended by  your health care provider. It may take some time for your health care provider to process the results.  Change the batteries as told by your health care provider.  Keep electronic devices away from your monitor. This includes:  Tablets.  MP3 players.  Cell phones.  While wearing your monitor you should avoid:  Electric blankets.  Firefighter.  Electric toothbrushes.  Microwave ovens.  Magnets.  Metal detectors. Get help right away if:  You have chest pain.  You have extreme difficulty breathing or shortness of breath.  You develop a very fast heartbeat that persists.  You develop dizziness that does not go away.  You faint or constantly feel like you are about to faint. Summary  A cardiac event monitor is a small recording device that is used to help detect abnormal heart rhythms (arrhythmias).  The monitor is used to record your heart rhythm when you have heart-related symptoms.  Make sure you understand how to send the information from the monitor to your health care provider.  It is important to press the button on the monitor when you have any heart-related symptoms.  Keep a diary of your activities, such as walking, doing chores, and taking medicine. It is very important to note what you were doing when you pushed the button to record your symptoms. This will help your health care provider learn what might be causing your symptoms. This information is not intended to replace advice given to you by your health care provider. Make sure you discuss any questions you have with your health care provider. Document Released: 03/03/2008 Document Revised: 05/09/2016 Document Reviewed: 05/09/2016 Elsevier Interactive Patient Education  2017 [employer].

## 2016-10-06 NOTE — Telephone Encounter (Signed)
Spoke w/ pt.  She reports that she is feeling better now, her HR is around 99, previously 148 @ EMS. Advised her that Dr. Mariah Milling reviewed her EKG and recommends that she start a blood thinner.  She works at [employer] and is familiar w/ Xarelto and Eliquis. She chooses to start Xarelto 20 mg since it is once daily dosing.  Advised her that I am leaving samples at the front desk for her to pick up her convenience.  Offered to keep an eye out for her tonight, as I'm here until 6:00,but she will stop by tomorrow, as is tired and does not want to go back out tonight.  She has already received call from [employer] and should receive her monitor in a day or 2.

## 2016-10-07 ENCOUNTER — Encounter (INDEPENDENT_AMBULATORY_CARE_PROVIDER_SITE_OTHER): Payer: BC Managed Care – PPO

## 2016-10-07 ENCOUNTER — Encounter: Payer: Self-pay | Admitting: Cardiovascular Disease

## 2016-10-07 DIAGNOSIS — I499 Cardiac arrhythmia, unspecified: Secondary | ICD-10-CM | POA: Diagnosis not present

## 2016-10-07 DIAGNOSIS — I10 Essential (primary) hypertension: Secondary | ICD-10-CM

## 2016-10-14 ENCOUNTER — Encounter: Payer: Self-pay | Admitting: Cardiovascular Disease

## 2016-10-16 ENCOUNTER — Encounter: Payer: Self-pay | Admitting: Cardiovascular Disease

## 2016-10-19 ENCOUNTER — Ambulatory Visit: Payer: BC Managed Care – PPO | Admitting: Dietician

## 2016-10-20 ENCOUNTER — Encounter: Payer: Self-pay | Admitting: Cardiovascular Disease

## 2016-10-22 ENCOUNTER — Telehealth: Payer: Self-pay | Admitting: Cardiovascular Disease

## 2016-10-22 MED ORDER — APIXABAN 5 MG PO TABS: 5.0000 mg | ORAL_TABLET | Freq: Two times a day (BID) | ORAL | 6 refills | Status: DC

## 2016-10-22 NOTE — Telephone Encounter (Signed)
Pt states she stopped taking her Xarelto. Does she need to start on Eliquis? Please advise.

## 2016-10-22 NOTE — Telephone Encounter (Signed)
Patient called in to make us aware that she stopped the xarelto on Saturday 10/17/16 because of the headaches, dizziness, and body aches. Reviewed with her that these are not common side effects and questioned her about any other new medications. She denied any further changes and that she has felt some better since being off of the medication. She did start full dose aspirin on Monday 10/19/16 but wants to know if she should switch to Eliquis or just wait until she follows up with Dr. Mariah MillingGollan. Let her know that we forwarded message to him and that I would also send this note as well for his review. She states that if Dr. Mariah MillingGollan strongly recommends it then she will start. Let her know that we would be in touch with a response once we hear his recommendations. She was appreciative for the all and had no further questions at this time. Reviewed 30 day event monitor and baseline shows sinus rhythm and no other events at this time.

## 2016-10-22 NOTE — Telephone Encounter (Signed)
Spoke with patient and reviewed Dr. Windell HummingbirdGollan's recommendations and she is agreeable. Sent in prescription and instructed her to call if she needs discount card and possible samples if we still have any. She verbalized understanding and had no further questions at this time.

## 2016-10-22 NOTE — Telephone Encounter (Signed)
Would stop the aspirin Would start eliquis 5 mg twice a day

## 2016-10-23 ENCOUNTER — Encounter: Payer: Self-pay | Admitting: Cardiovascular Disease

## 2016-10-23 ENCOUNTER — Telehealth: Payer: Self-pay | Admitting: Cardiovascular Disease

## 2016-10-23 NOTE — Telephone Encounter (Signed)
Left voicemail message to call back  

## 2016-10-23 NOTE — Telephone Encounter (Signed)
Mychart message received from patient and she was able to get prescription with no problems. Replied letting her know to please call if she should have any questions.

## 2016-10-23 NOTE — Telephone Encounter (Signed)
Pt states she is having problems with her pharmacy with her Eliquis, also states she needs some samples.

## 2016-10-24 ENCOUNTER — Telehealth: Payer: Self-pay | Admitting: Physician Assistant

## 2016-10-24 NOTE — Telephone Encounter (Signed)
Paged by [facility]. Patient in afib at rate of 170s since 9:40am today. When I called patient her HR dropped down to 90-100s with irregular rate after metoprolol 25mg  x1 and Cardizem 30mg  x1. No dizziness, syncope, shortness of breath or cp. Advised to start taking metoprolol 25mg  BID. Take cardizem for HR above 120 PRN. She goes to er if worsening of HR with symptoms. Otherwise, she will call Monday. She is agree with plan. Started Eliquis yesterday. She understand importance of compliance.

## 2016-10-26 ENCOUNTER — Encounter: Payer: Self-pay | Admitting: Cardiovascular Disease

## 2016-10-27 ENCOUNTER — Encounter: Payer: Self-pay | Admitting: Cardiovascular Disease

## 2016-10-27 ENCOUNTER — Ambulatory Visit (INDEPENDENT_AMBULATORY_CARE_PROVIDER_SITE_OTHER): Payer: BC Managed Care – PPO | Admitting: Cardiovascular Disease

## 2016-10-27 VITALS — BP 132/72 | HR 56 | Ht 68.0 in | Wt 236.0 lb

## 2016-10-27 DIAGNOSIS — R0789 Other chest pain: Secondary | ICD-10-CM

## 2016-10-27 DIAGNOSIS — I10 Essential (primary) hypertension: Secondary | ICD-10-CM

## 2016-10-27 DIAGNOSIS — R55 Syncope and collapse: Secondary | ICD-10-CM

## 2016-10-27 DIAGNOSIS — Z87898 Personal history of other specified conditions: Secondary | ICD-10-CM | POA: Diagnosis not present

## 2016-10-27 MED ORDER — FLECAINIDE ACETATE 100 MG PO TABS: 100.0000 mg | ORAL_TABLET | Freq: Two times a day (BID) | ORAL | 3 refills | Status: DC

## 2016-10-27 NOTE — Patient Instructions (Addendum)
Medication Instructions:   Please start flecainide one pill twice a day (ok to start 1/2 pill twice a day for a few days, then increase up to a full pill)  For break through atrial fib, Take extra 1/2 flecainide and possibly extra diltiazem or metoprolol  Labwork:  No new labs needed  Testing/Procedures:  No further testing at this time   I recommend watching educational videos on topics of interest to you at:       [email or web address]  Enter code: HEARTCARE    Follow-Up: It was a pleasure seeing you in the office today. Please call us if you have new issues that need to be addressed before your next appt.  [phone]  Your physician wants you to follow-up in: 3 months.  You will receive a reminder letter in the mail two months in advance. If you don't receive a letter, please call our office to schedule the follow-up appointment.  If you need a refill on your cardiac medications before your next appointment, please call your pharmacy.

## 2016-10-27 NOTE — Progress Notes (Signed)
Cardiology Office Note  Date:  10/27/2016   ID:  Meghan Cole, DOB 1951/03/07, MRN [id number]  PCP:  Meghan Cole, Meghan J, DO   Chief Complaint  Patient presents with  . other    6 week follow up. Patient c/o SOB and Chest pain. Meds reviewed verbally with patient.     HPI:    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  borderline HTN Anxiety Hypothyroid ASD repair, age 89 She is a retired Engineer, civil (consulting)nurse  who presents by  Referral from Meghan Cole  For consultation of her irregular heartbeat Dating back to summer of 2017  On her last clinic visit we gave her diltiazem and metoprolol to take as needed for palpitations Event monitor was placed Before event monitor arrived she had tachycardia, went to EMS had documented atrial fibrillation on EKG, took metoprolol and converted to normal sinus rhythm She's been wearing her event monitor She's had 2 episodes of atrial fibrillation both on May 19, first episode appeared regular and rapid, second episode or irregular consistent with atrial fibrillation Atrial fib last  Sat, 10/24/2016 10:45 Am, rate 154, up to 174 on tele,  Back to NSR 12:45 Had episode at 5 PM Rhythm strips shown to Meghan Cole who was in the office today, case discussed  Unable to tolerate metoprolol 25 mg twice a day, decrease the dose down to 12.5 mg twice a day  She denies any significant shortness of breath or chest pain No significant leg edema, no orthostasis  EKG personally reviewed by myself on todays visit Shows normal sinus rhythm with rate 56 bpm no significant ST-T wave changes  PMH:   has a past medical history of Atrial septal defect; Heart disease; Hypertension; and Thyroid disease.  PSH:    Past Surgical History:  Procedure Laterality Date  . ATRIAL SEPTAL DEFECT(ASD) CLOSURE    .  COLONOSCOPY WITH PROPOFOL     2011  . EXPLORATION POST OPERATIVE OPEN HEART      Current Outpatient Prescriptions  Medication Sig Dispense Refill  . apixaban (ELIQUIS) 5 MG TABS tablet Take 1 tablet (5 mg total) by mouth 2 (two) times daily. 60 tablet 6  . b complex vitamins tablet Take 1 tablet by mouth daily.    Marland Kitchen. diltiazem (CARDIZEM) 30 MG tablet Take 1 tablet (30 mg total) by mouth 3 (three) times daily as needed. 90 tablet 6  . EQ FIBER SUPPLEMENT PO Take by mouth.    . levothyroxine (SYNTHROID, LEVOTHROID) 50 MCG tablet Take 50 mcg by mouth daily before breakfast.    . Melatonin ER 5 MG TBCR Take 1 tablet by mouth at bedtime.     . metoprolol tartrate (LOPRESSOR) 25 MG tablet Take 1 tablet (25 mg total) by mouth 2 (two) times daily as needed. 60 tablet 3  . Multiple Vitamin (MULTIVITAMIN) tablet Take 1 tablet by mouth daily.     No current facility-administered medications for this visit.      Allergies:   Patient has no known allergies.   Social History:  The patient  reports that she has quit smoking. She has never used smokeless tobacco. She reports that she does not drink alcohol or use drugs.   Family History:   family history includes Breast cancer (age of onset: 5530) in her paternal grandmother; Cancer in  her mother; Heart attack in her father; Heart disease in her father.    Review of Systems: Review of Systems  Constitutional: Negative.   Respiratory: Negative.   Cardiovascular: Positive for palpitations.       Tachycardia  Gastrointestinal: Negative.   Musculoskeletal: Negative.   Neurological: Negative.   Psychiatric/Behavioral: Negative.   All other systems reviewed and are negative.    PHYSICAL EXAM: VS:  BP 132/72 (BP Location: Left Arm, Patient Position: Sitting, Cuff Size: Normal)   Pulse (!) 56   Ht 5\' 8"  (1.727 m)   Wt 236 lb (107 kg)   BMI 35.88 kg/m  , BMI Body mass index is 35.88 kg/m.  GEN: Well nourished, well developed, in no acute  distress  HEENT: normal  Neck: no JVD, carotid bruits, or masses Cardiac: RRR; no murmurs, rubs, or gallops,no edema  Respiratory:  clear to auscultation bilaterally, normal work of breathing GI: soft, nontender, nondistended, + BS MS: no deformity or atrophy  Skin: warm and dry, no rash Neuro:  Strength and sensation are intact Psych: euthymic mood, full affect    Recent Labs: 06/12/2016: ALT 18 08/05/2016: BUN 16; Creatinine, Ser 0.78; Potassium 4.4; Sodium 138; TSH 1.830    Lipid Panel Lab Results  Component Value Date   CHOL 199 11/28/2015   HDL 89 11/28/2015   LDLCALC 100 (H) 11/28/2015   TRIG 52 11/28/2015      Wt Readings from Last 3 Encounters:  10/27/16 236 lb (107 kg)  10/06/16 236 lb 12 oz (107.4 kg)  09/16/16 235 lb 3.2 oz (106.7 kg)       ASSESSMENT AND PLAN:  Paroxysmal atrial fibrillation Recommended she stay on metoprolol 12.5 mill grams twice a day Stay on anticoagulation Recommended that she start flecainide 100 mg twice a day For breakthrough arrhythmia suggested she take extra half flecainide pill with diltiazem  Essential hypertension - Plan: EKG 12-Lead, Cardiac event monitor Blood pressure is well controlled on today's visit. No changes made to the medications. She stopped the HCTZ as blood pressure was running low  Obesity (BMI 35.0-39.9 without comorbidity) We have encouraged continued exercise, careful diet management in an effort to lose weight.  Disposition:   F/U  3 months  Recommended that she call us if she has additional episodes of atrial fibrillation   Total encounter time more than 25 minutes  Greater than 50% was spent in counseling and coordination of care with the patient    No orders of the defined types were placed in this encounter.    Signed, Meghan Cole, M.D., Ph.D. 10/27/2016  [facility] [place], [place] [phone]

## 2016-10-28 ENCOUNTER — Encounter: Payer: Self-pay | Admitting: Cardiovascular Disease

## 2016-10-30 ENCOUNTER — Encounter: Payer: Self-pay | Admitting: Cardiovascular Disease

## 2016-11-12 ENCOUNTER — Encounter: Payer: Self-pay | Admitting: Cardiovascular Disease

## 2016-11-17 ENCOUNTER — Ambulatory Visit: Payer: BC Managed Care – PPO | Admitting: Cardiovascular Disease

## 2016-11-17 ENCOUNTER — Encounter: Payer: Self-pay | Admitting: Family Medicine

## 2016-11-17 DIAGNOSIS — E039 Hypothyroidism, unspecified: Secondary | ICD-10-CM

## 2016-11-17 MED ORDER — LEVOTHYROXINE SODIUM 50 MCG PO TABS: 50.0000 ug | ORAL_TABLET | Freq: Every day | ORAL | 3 refills | Status: DC

## 2016-11-22 ENCOUNTER — Encounter: Payer: Self-pay | Admitting: Cardiovascular Disease

## 2016-11-24 ENCOUNTER — Encounter: Payer: Self-pay | Admitting: Cardiovascular Disease

## 2016-11-30 ENCOUNTER — Encounter: Payer: Self-pay | Admitting: Cardiovascular Disease

## 2016-11-30 ENCOUNTER — Other Ambulatory Visit: Payer: Self-pay | Admitting: Cardiovascular Disease

## 2016-11-30 ENCOUNTER — Encounter: Payer: Self-pay | Admitting: Family Medicine

## 2016-11-30 DIAGNOSIS — I480 Paroxysmal atrial fibrillation: Secondary | ICD-10-CM

## 2016-11-30 MED ORDER — PROPRANOLOL HCL 20 MG PO TABS: 20.0000 mg | ORAL_TABLET | Freq: Three times a day (TID) | ORAL | 3 refills | Status: DC | PRN

## 2016-12-01 DIAGNOSIS — I480 Paroxysmal atrial fibrillation: Secondary | ICD-10-CM | POA: Insufficient documentation

## 2016-12-16 ENCOUNTER — Encounter: Payer: Self-pay | Admitting: Gastroenterology

## 2016-12-17 ENCOUNTER — Other Ambulatory Visit: Payer: Self-pay

## 2016-12-17 ENCOUNTER — Telehealth: Payer: Self-pay | Admitting: Gastroenterology

## 2016-12-17 DIAGNOSIS — B182 Chronic viral hepatitis C: Secondary | ICD-10-CM

## 2016-12-17 NOTE — Telephone Encounter (Signed)
Patient needs to get her Hep B requisiton. Please mail them to her for [facility].

## 2016-12-17 NOTE — Telephone Encounter (Signed)
Message sent to pt via mychart letting her know lab order has been printed and mailed.

## 2017-01-01 ENCOUNTER — Encounter: Payer: Self-pay | Admitting: Cardiovascular Disease

## 2017-01-04 LAB — HEPATIC FUNCTION PANEL
ALBUMIN: 4.5 g/dL (ref 3.6–4.8)
ALT: 15 IU/L (ref 0–32)
AST: 24 IU/L (ref 0–40)
Alkaline Phosphatase: 62 IU/L (ref 39–117)
BILIRUBIN TOTAL: 0.4 mg/dL (ref 0.0–1.2)
Bilirubin, Direct: 0.13 mg/dL (ref 0.00–0.40)
TOTAL PROTEIN: 7.4 g/dL (ref 6.0–8.5)

## 2017-01-04 LAB — HCV RT-PCR, QUANT (NON-GRAPH)

## 2017-01-11 ENCOUNTER — Encounter: Payer: Self-pay | Admitting: Family Medicine

## 2017-01-12 ENCOUNTER — Telehealth: Payer: Self-pay

## 2017-01-12 NOTE — Telephone Encounter (Signed)
-----   Message from Midge Miniumarren Wohl, MD sent at 01/11/2017  6:51 AM EDT ----- Let the patient know that her hepatitis C viral test was negative

## 2017-01-12 NOTE — Telephone Encounter (Signed)
Pt has been notified of lab results via MyChart messaging.

## 2017-01-27 ENCOUNTER — Ambulatory Visit: Payer: BC Managed Care – PPO | Admitting: Cardiovascular Disease

## 2017-03-27 IMAGING — MG MM DIGITAL SCREENING BILAT W/ CAD
5 series · 5 of 5 positions shown · non-contrast
Comparison: Previous exam(s).

CLINICAL DATA: Screening.

EXAM:
DIGITAL SCREENING BILATERAL MAMMOGRAM WITH CAD

[R CC]
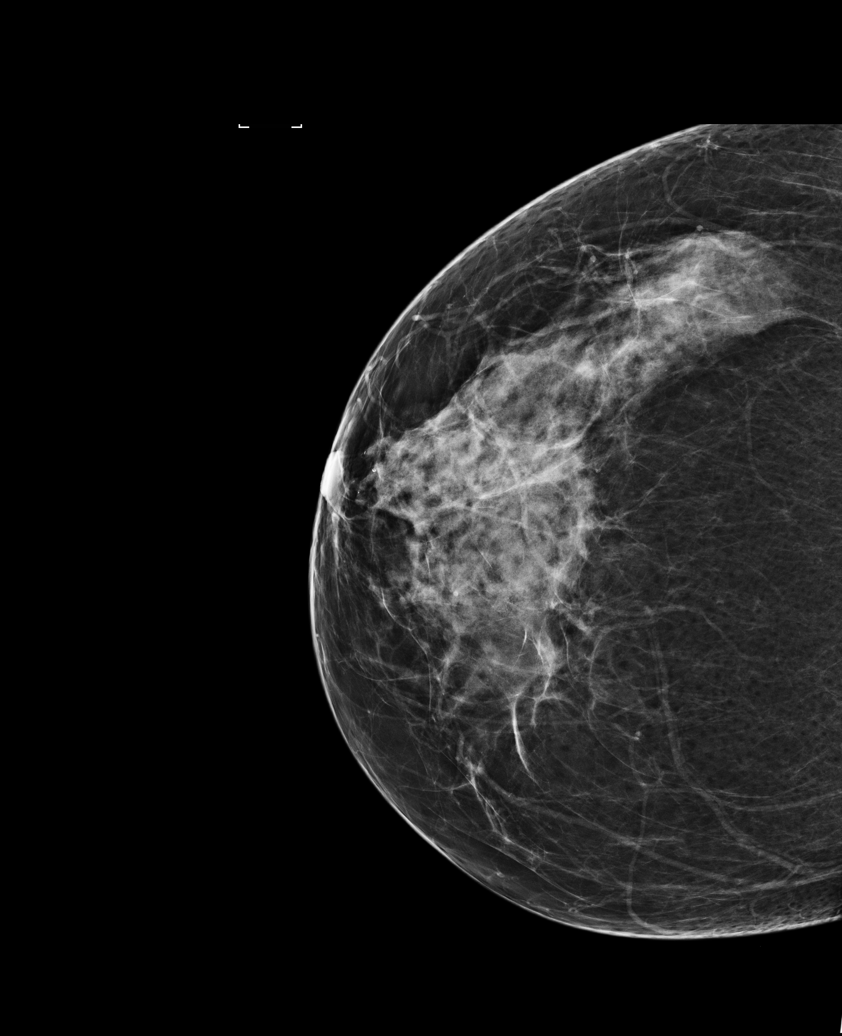

[L MLO (1 of 2)]
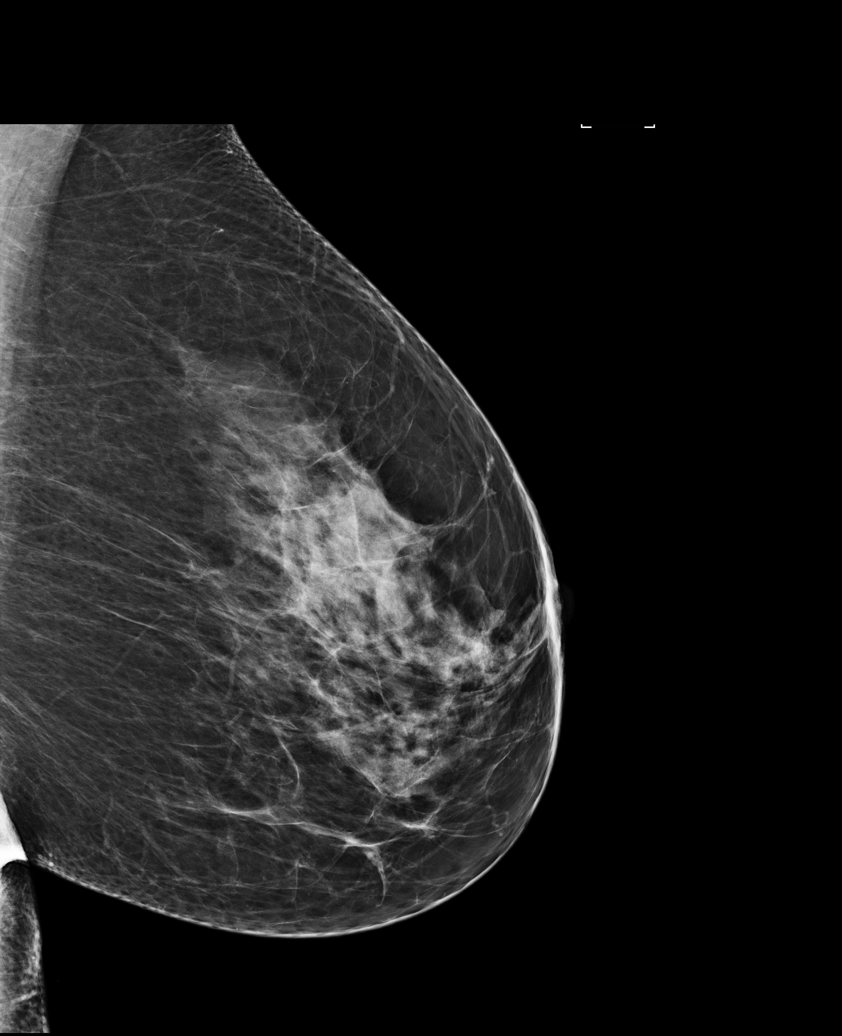

[L MLO (2 of 2)]
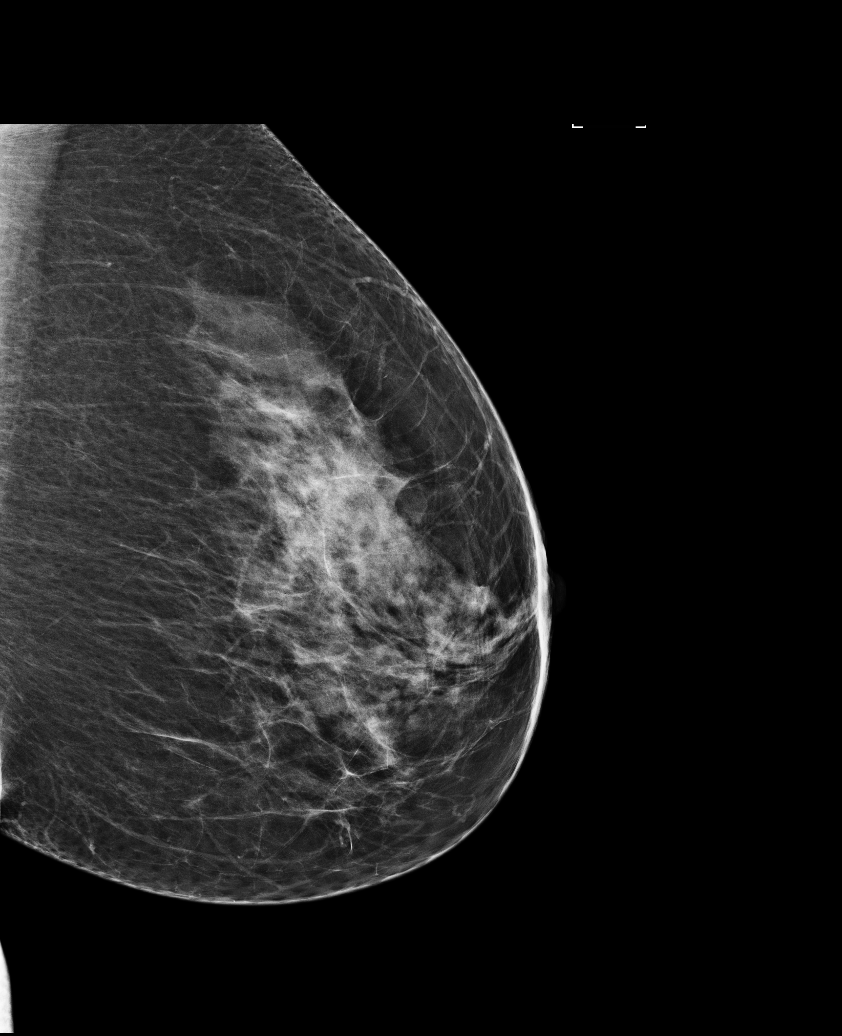

[L CC]
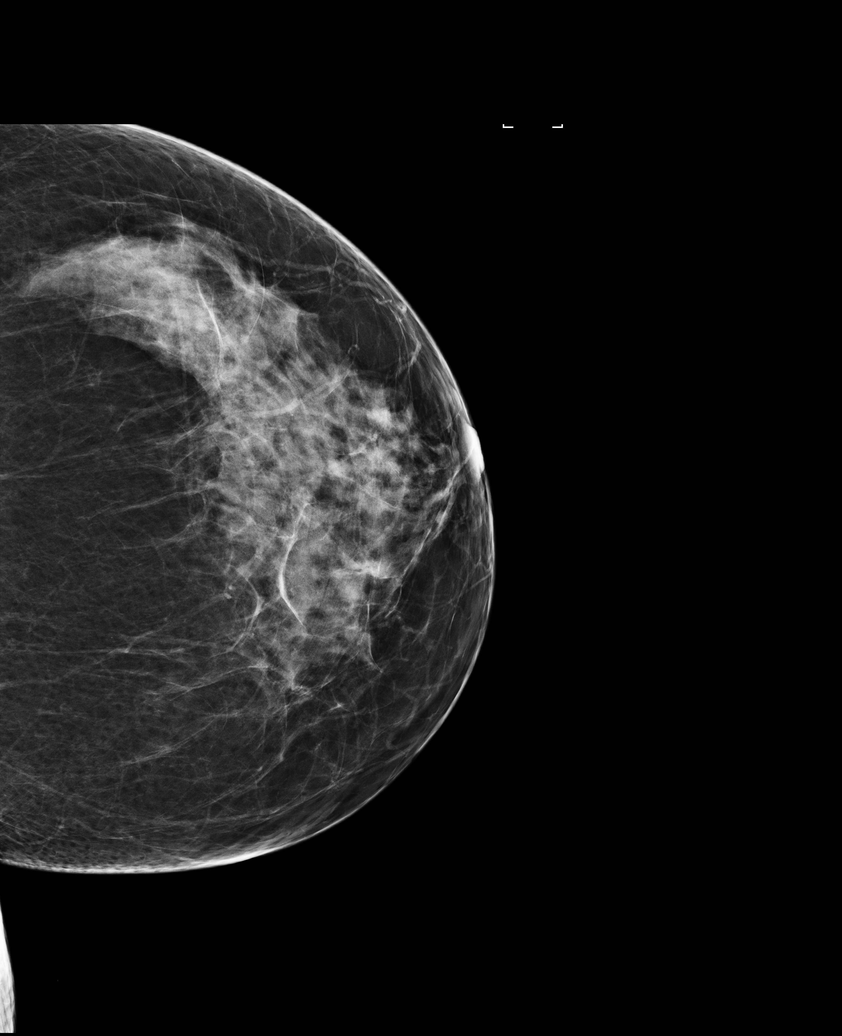

[R MLO]
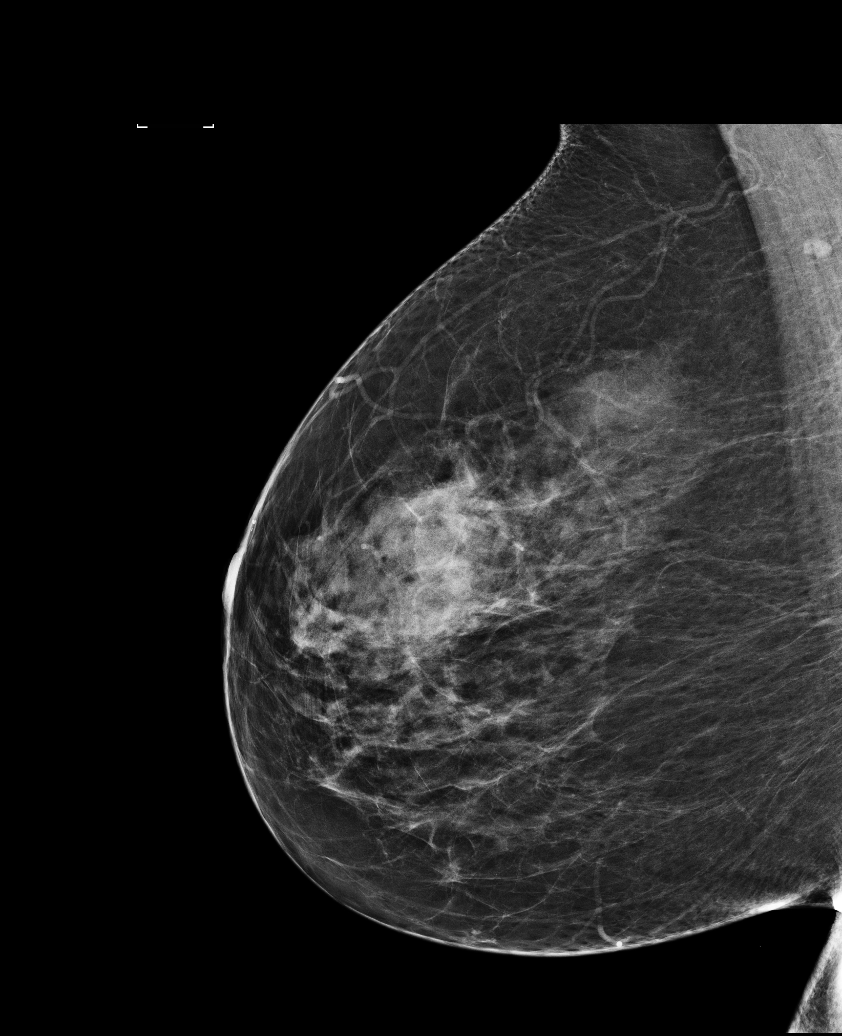

[5 of 5 positions shown; findings below may reference images not displayed]

ACR Breast Density Category d: The breast tissue is extremely dense,
which lowers the sensitivity of mammography.
FINDINGS: There are no findings suspicious for malignancy. Images were
processed with CAD.
IMPRESSION: No mammographic evidence of malignancy. A result letter of this
screening mammogram will be mailed directly to the patient.

RECOMMENDATION:
Screening mammogram in one year. (Code:[id number]-[id number])

BI-RADS CATEGORY  1: Negative.

## 2017-08-17 ENCOUNTER — Ambulatory Visit: Payer: BC Managed Care – PPO | Admitting: Cardiovascular Disease

## 2017-08-17 ENCOUNTER — Encounter: Payer: Self-pay | Admitting: Cardiovascular Disease

## 2017-08-17 VITALS — BP 118/82 | HR 97 | Ht 67.0 in | Wt 246.5 lb

## 2017-08-17 DIAGNOSIS — I480 Paroxysmal atrial fibrillation: Secondary | ICD-10-CM | POA: Diagnosis not present

## 2017-08-17 DIAGNOSIS — I10 Essential (primary) hypertension: Secondary | ICD-10-CM

## 2017-08-17 DIAGNOSIS — I499 Cardiac arrhythmia, unspecified: Secondary | ICD-10-CM | POA: Diagnosis not present

## 2017-08-17 MED ORDER — PROPAFENONE HCL 150 MG PO TABS: 150.0000 mg | ORAL_TABLET | Freq: Three times a day (TID) | ORAL | 3 refills | Status: DC

## 2017-08-17 NOTE — Patient Instructions (Addendum)
Medication Instructions:   Please start propafenone 150 mg three times a day Stay on diltiazem three times a day as needed Stay on metoprolol succinate one a day Take metoprolol tartrate as needed for fast heart rate   Labwork:  No new labs needed  Testing/Procedures:  No further testing at this time   Follow-Up: It was a pleasure seeing you in the office today. Please call us if you have new issues that need to be addressed before your next appt.  [phone]  Your physician wants you to follow-up in: 1 month.    If you need a refill on your cardiac medications before your next appointment, please call your pharmacy.  For educational health videos Log in to : [email or web address] Or : [email or web address], password : [email or web address]

## 2017-08-17 NOTE — Progress Notes (Addendum)
Cardiology Office Note  Date:  08/17/2017   ID:  Meghan Cole, DOB January 29, 1951, MRN [id number]  PCP:  Meghan Cords, DO   Chief Complaint  Patient presents with  . OTHER    Afib. Meds reviewed verbally with pt.    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  Did not see them on a regular basis  Did not have much palpitations or tachycardia until the past 4 days  Friday 1 Am went into atrial fibrillation by her symptoms Took diltiazem for rate control Did not go back into NSR Now taking diltiazem 30 Q6 for rate control with her metoprolol succinate 25 daily  Nose bleed this morning but has stopped Feels she is tolerating atrial fibrillation very well if rate stays low  Significant stressors, husband is getting ready for bypass surgery in the next several weeks with aortic valve replacement  Previous event monitor  Documenting atrial fibrillation Several episodes of atrial fibrillation both on May 19, first episode appeared regular and rapid, second episode or irregular consistent with atrial fibrillation Atrial fib last  Sat, 10/24/2016 10:45 Am, rate 154, up to 174 on tele,  Back to NSR 12:45 Had episode at 5 PM  Shows atrial fibrillation with ventricular rate 95 bpm nonspecific ST abnormalityEKG personally reviewed by myself on todays visit   PMH:   has a past medical history of Atrial septal defect, Heart disease, Hypertension, and Thyroid disease.  PSH:    Past Surgical History:  Procedure Laterality Date  . ATRIAL  SEPTAL DEFECT(ASD) CLOSURE    . COLONOSCOPY WITH PROPOFOL     2011  . EXPLORATION POST OPERATIVE OPEN HEART      Current Outpatient Medications  Medication Sig Dispense Refill  . apixaban (ELIQUIS) 5 MG TABS tablet Take 1 tablet (5 mg total) by mouth 2 (two) times daily. 60 tablet 6  . b complex vitamins tablet Take 1 tablet by mouth daily.    Marland Kitchen diltiazem (CARDIZEM) 30 MG tablet Take 1 tablet (30 mg total) by mouth 3 (three) times daily as needed. 90 tablet 6  . levothyroxine (SYNTHROID, LEVOTHROID) 50 MCG tablet Take 1 tablet (50 mcg total) by mouth daily before breakfast. 90 tablet 3  . Melatonin ER 5 MG TBCR Take 1 tablet by mouth at bedtime.     . Multiple Vitamin (MULTIVITAMIN) tablet Take 1 tablet by mouth daily.     No current facility-administered medications for this visit.      Allergies:   Patient has no known allergies.   Social History:  The patient  reports that she has quit smoking. she has never used smokeless tobacco. She reports that she does not drink alcohol or use drugs.   Family History:   family history includes Breast cancer (age of onset: 43) in her paternal grandmother; Cancer in her mother; Heart attack in her father; Heart disease in her father.   Review of Systems: Review of Systems  Constitutional: Negative.   Respiratory: Negative.   Cardiovascular: Positive for palpitations.  Tachycardia  Gastrointestinal: Negative.   Musculoskeletal: Negative.   Neurological: Negative.   Psychiatric/Behavioral: Negative.   All other systems reviewed and are negative.   PHYSICAL EXAM: VS:  BP 118/82 (BP Location: Left Arm, Patient Position: Sitting, Cuff Size: Large)   Pulse 97   Ht 5\' 7"  (1.702 m)   Wt 246 lb 8 oz (111.8 kg)   BMI 38.61 kg/m  , BMI Body mass index is 38.61 kg/m.  Constitutional:  oriented to person, place, and time. No distress. Obese HENT:  Head: Normocephalic and atraumatic.  Eyes:  no discharge. No scleral icterus.  Neck:  Normal range of motion. Neck supple. No JVD present.  Cardiovascular: Irregularly irregular,  normal heart sounds and intact distal pulses. Exam reveals no gallop and no friction rub. No edema No murmur heard. Pulmonary/Chest: Effort normal and breath sounds normal. No stridor. No respiratory distress.  no wheezes.  no rales.  no tenderness.  Abdominal: Soft.  no distension.  no tenderness.  Musculoskeletal: Normal range of motion.  no  tenderness or deformity.  Neurological:  normal muscle tone. Coordination normal. No atrophy Skin: Skin is warm and dry. No rash noted. not diaphoretic.  Psychiatric:  normal mood and affect. behavior is normal. Thought content normal.     Recent Labs: 12/28/2016: ALT 15    Lipid Panel Lab Results  Component Value Date   CHOL 199 11/28/2015   HDL 89 11/28/2015   LDLCALC 100 (H) 11/28/2015   TRIG 52 11/28/2015      Wt Readings from Last 3 Encounters:  08/17/17 246 lb 8 oz (111.8 kg)  10/27/16 236 lb (107 kg)  10/06/16 236 lb 12 oz (107.4 kg)       ASSESSMENT AND PLAN:  Paroxysmal atrial fibrillation Recommend she stay on metoprolol succinate 25 daily with diltiazem 30 mg 3 times daily as needed for rate control as she is doing She could take extra metoprolol tartrate as needed for rate control Stay on Eliquis 5 twice daily At her request for better rate control and attempt to cardiovert we will add half a known 150 three times daily She will call us if she would like DCCV She did not tolerate flecainide in the past We will try to avoid amiodarone given history of thyroid disease  Essential hypertension - Plan: EKG 12-Lead, Cardiac event monitor Blood pressure is well controlled on today's visit. No changes made to the medications. Currently taking metoprolol and short acting diltiazem for rate control  Obesity (BMI 35.0-39.9 without comorbidity) Discussed with her again today, lots of stressors, difficulty losing weight Recommend  regular walking program once her husband has had his surgery  Disposition:   F/U  1 month  Recommended that she call us if she has additional episodes of atrial fibrillation   Total encounter time more than 45 minutes  Greater than 50% was spent in counseling and coordination of care with the patient    Orders Placed This Encounter  Procedures  . EKG 12-Lead     Signed, Dossie Arbourim Laryah Neuser, M.D., Ph.D. 08/17/2017  [facility], [place] [phone]

## 2017-09-02 ENCOUNTER — Encounter: Payer: Self-pay | Admitting: Cardiovascular Disease

## 2017-09-06 ENCOUNTER — Encounter: Payer: Self-pay | Admitting: Cardiovascular Disease

## 2017-09-14 ENCOUNTER — Other Ambulatory Visit: Payer: Self-pay | Admitting: Cardiovascular Disease

## 2017-09-14 NOTE — Telephone Encounter (Signed)
Please review for refill, Thanks !  

## 2017-09-15 ENCOUNTER — Other Ambulatory Visit: Payer: Self-pay

## 2017-09-15 DIAGNOSIS — Z7901 Long term (current) use of anticoagulants: Secondary | ICD-10-CM

## 2017-09-18 NOTE — Progress Notes (Signed)
Cardiology Office Note  Date:  09/21/2017   ID:  [name], DOB February 14, 1951, MRN [id number]  PCP:  Smitty CordsKaramalegos, Alexander J, DO   Chief Complaint  Patient presents with  . OTHER    Discuss Eliquis. Meds reviewed verbally with pt.    HPI:   Ms. 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  Middle of May 2019  Still with significant stressors at home though improved after her husband with bypass surgery and new heart valve  EKG personally reviewed by myself on todays visit Shows atrial fibrillation rate 70 bpm nonspecific T wave abnormality anterolateral leads  Other past medical history reviewed Previous event monitor  Documenting atrial fibrillation Several episodes of atrial fibrillation both on May 19, first episode appeared regular and rapid, second episode or irregular consistent with atrial fibrillation Atrial fib last  Sat, 10/24/2016 10:45 Am, rate 154, up to 174 on tele,  Back to NSR 12:45 Had episode at 5 PM    PMH:   has a past medical history of Atrial septal defect, Heart disease, Hypertension, and Thyroid disease.  PSH:    Past Surgical History:  Procedure Laterality Date  . ATRIAL SEPTAL DEFECT(ASD) CLOSURE    . COLONOSCOPY WITH PROPOFOL     2011  .  EXPLORATION POST OPERATIVE OPEN HEART      Current Outpatient Medications  Medication Sig Dispense Refill  . ELIQUIS 5 MG TABS tablet TAKE 1 TABLET BY MOUTH TWICE DAILY 60 tablet 0  . levothyroxine (SYNTHROID, LEVOTHROID) 50 MCG tablet Take 1 tablet (50 mcg total) by mouth daily before breakfast. 90 tablet 3  . Melatonin ER 5 MG TBCR Take 1 tablet by mouth at bedtime.     . propafenone (RYTHMOL) 150 MG tablet Take 1 tablet (150 mg total) by mouth every 8 (eight) hours. 90 tablet 3   No current facility-administered medications for this visit.      Allergies:   Patient has no known allergies.   Social History:  The patient  reports that she has quit smoking. She has never used smokeless tobacco. She reports that she does not drink alcohol or use drugs.   Family History:   family history includes Breast cancer (age of onset: 3830) in her paternal grandmother; Cancer in her mother; Heart attack in her father; Heart disease in her father.   Review of Systems: Review of Systems  Constitutional: Positive for malaise/fatigue.  Respiratory: Negative.   Cardiovascular: Positive for palpitations.       Tachycardia  Gastrointestinal: Positive for nausea.  Musculoskeletal: Negative.   Neurological: Negative.   Psychiatric/Behavioral: Negative.   All other systems reviewed and are negative.   PHYSICAL EXAM: VS:  BP 120/80 (BP Location: Left Arm, Patient Position: Sitting, Cuff Size: Large)   Pulse 70  Ht 5\' 7"  (1.702 m)   Wt 247 lb 12 oz (112.4 kg)   BMI 38.80 kg/m  , BMI Body mass index is 38.8 kg/m.  Constitutional:  oriented to person, place, and time. No distress. Obese HENT:  Head: Normocephalic and atraumatic.  Eyes:  no discharge. No scleral icterus.  Neck: Normal range of motion. Neck supple. No JVD present.  Cardiovascular: Irregularly irregular,  normal heart sounds and intact distal pulses. Exam reveals no gallop and no friction rub. No edema No murmur  heard. Pulmonary/Chest: Effort normal and breath sounds normal. No stridor. No respiratory distress.  no wheezes.  no rales.  no tenderness.  Abdominal: Soft.  no distension.  no tenderness.  Musculoskeletal: Normal range of motion.  no  tenderness or deformity.  Neurological:  normal muscle tone. Coordination normal. No atrophy Skin: Skin is warm and dry. No rash noted. not diaphoretic.  Psychiatric:  normal mood and affect. behavior is normal. Thought content normal.     Recent Labs: 12/28/2016: ALT 15    Lipid Panel Lab Results  Component Value Date   CHOL 199 11/28/2015   HDL 89 11/28/2015   LDLCALC 100 (H) 11/28/2015   TRIG 52 11/28/2015      Wt Readings from Last 3 Encounters:  09/21/17 247 lb 12 oz (112.4 kg)  08/17/17 246 lb 8 oz (111.8 kg)  10/27/16 236 lb (107 kg)       ASSESSMENT AND PLAN:  Paroxysmal atrial fibrillation Stay on Eliquis 5 twice daily Stay on the metoprolol Stay on afternoon We will arrange for DCCV She did not tolerate flecainide in the past We will try to avoid amiodarone given history of thyroid disease Scheduled to see EP at [facility] in May If cardioversion fails may need to change antiarrhythmic medication Long discussion concerning risk and benefit of the procedure, she is willing to proceed  Essential hypertension - Blood pressure is well controlled on today's visit. No changes made to the medications.  Obesity (BMI 35.0-39.9 without comorbidity)  lots of stressors, difficulty losing weight Recommend regular walking program , low carbohydrate diet  Disposition:   F/U  6 month  Recommended that she call us if she has additional episodes of atrial fibrillation   Total encounter time more than 45 minutes  Greater than 50% was spent in counseling and coordination of care with the patient    Orders Placed This Encounter  Procedures  . EKG 12-Lead     Signed, Dossie Arbour, M.D., Ph.D. 09/21/2017  [facility]  [place], [place] [phone]

## 2017-09-21 ENCOUNTER — Ambulatory Visit: Payer: BC Managed Care – PPO | Admitting: Cardiovascular Disease

## 2017-09-21 ENCOUNTER — Encounter: Payer: Self-pay | Admitting: Cardiovascular Disease

## 2017-09-21 VITALS — BP 120/80 | HR 70 | Ht 67.0 in | Wt 247.8 lb

## 2017-09-21 DIAGNOSIS — I10 Essential (primary) hypertension: Secondary | ICD-10-CM | POA: Diagnosis not present

## 2017-09-21 DIAGNOSIS — R0789 Other chest pain: Secondary | ICD-10-CM | POA: Diagnosis not present

## 2017-09-21 DIAGNOSIS — R55 Syncope and collapse: Secondary | ICD-10-CM | POA: Diagnosis not present

## 2017-09-21 DIAGNOSIS — I480 Paroxysmal atrial fibrillation: Secondary | ICD-10-CM

## 2017-09-21 NOTE — Patient Instructions (Addendum)
Medication Instructions:   No medication changes made  Labwork:  No new labs needed  Testing/Procedures:  We will schedule a cardioversion for atrial fibrillation You are scheduled for a Cardioversion on _May 8th__ with Dr._Gollan_ [facility] at _06:30_ a.m. on the day of your procedure.  DIET INSTRUCTIONS:  Nothing to eat or drink after midnight except your medications with a small sip of water.         1) Labs: _Done at office visit__  2) Medications:  YOU MAY TAKE ALL of your remaining medications with a small amount of water.  3) Must have a responsible person to drive you home.  4) Bring a current list of your medications and current insurance cards.    If you have any questions after you get home, please call the office at [phone]   Follow-Up: It was a pleasure seeing you in the office today. Please call us if you have new issues that need to be addressed before your next appt.  [phone]  Your physician wants you to follow-up in: 6 months.  You will receive a reminder letter in the mail two months in advance. If you don't receive a letter, please call our office to schedule the follow-up appointment.  If you need a refill on your cardiac medications before your next appointment, please call your pharmacy.  For educational health videos Log in to : [email or web address] Or : [email or web address], password : [email or web address]

## 2017-09-22 LAB — CBC WITH DIFFERENTIAL/PLATELET
Basophils Absolute: 0.1 10*3/uL (ref 0.0–0.2)
Basos: 2 %
EOS (ABSOLUTE): 0.1 10*3/uL (ref 0.0–0.4)
Eos: 2 %
Hematocrit: 39.4 % (ref 34.0–46.6)
Hemoglobin: 12.7 g/dL (ref 11.1–15.9)
IMMATURE GRANS (ABS): 0 10*3/uL (ref 0.0–0.1)
IMMATURE GRANULOCYTES: 0 %
LYMPHS: 28 %
Lymphocytes Absolute: 1.5 10*3/uL (ref 0.7–3.1)
MCH: 29.7 pg (ref 26.6–33.0)
MCHC: 32.2 g/dL (ref 31.5–35.7)
MCV: 92 fL (ref 79–97)
MONOS ABS: 0.6 10*3/uL (ref 0.1–0.9)
Monocytes: 10 %
NEUTROS PCT: 58 %
Neutrophils Absolute: 3.1 10*3/uL (ref 1.4–7.0)
PLATELETS: 245 10*3/uL (ref 150–379)
RBC: 4.28 x10E6/uL (ref 3.77–5.28)
RDW: 14.4 % (ref 12.3–15.4)
WBC: 5.4 10*3/uL (ref 3.4–10.8)

## 2017-09-22 LAB — BASIC METABOLIC PANEL
BUN/Creatinine Ratio: 12 (ref 12–28)
BUN: 10 mg/dL (ref 8–27)
CALCIUM: 9.8 mg/dL (ref 8.7–10.3)
CO2: 25 mmol/L (ref 20–29)
CREATININE: 0.82 mg/dL (ref 0.57–1.00)
Chloride: 102 mmol/L (ref 96–106)
GFR calc Af Amer: 87 mL/min/{1.73_m2} (ref >59)
GFR calc non Af Amer: 76 mL/min/{1.73_m2} (ref >59)
GLUCOSE: 93 mg/dL (ref 65–99)
POTASSIUM: 4.8 mmol/L (ref 3.5–5.2)
SODIUM: 140 mmol/L (ref 134–144)

## 2017-09-26 ENCOUNTER — Other Ambulatory Visit: Payer: Self-pay | Admitting: Cardiovascular Disease

## 2017-09-27 NOTE — Telephone Encounter (Signed)
Please review for refill, Thanks !  

## 2017-10-12 ENCOUNTER — Other Ambulatory Visit: Payer: Self-pay | Admitting: Cardiovascular Disease

## 2017-10-13 ENCOUNTER — Ambulatory Visit: Payer: BC Managed Care – PPO | Admitting: Anesthesiology

## 2017-10-13 ENCOUNTER — Ambulatory Visit
Admission: RE | Admit: 2017-10-13 | Discharge: 2017-10-13 | Disposition: A | Discharge status: Home / Self Care | Payer: BC Managed Care – PPO | Source: Ambulatory Visit | Attending: Cardiovascular Disease | Admitting: Cardiovascular Disease

## 2017-10-13 ENCOUNTER — Encounter
Admission: RE | Disposition: A | Discharge status: Home / Self Care | Payer: Self-pay | Source: Ambulatory Visit | Attending: Cardiovascular Disease

## 2017-10-13 DIAGNOSIS — I10 Essential (primary) hypertension: Secondary | ICD-10-CM | POA: Insufficient documentation

## 2017-10-13 DIAGNOSIS — Z7902 Long term (current) use of antithrombotics/antiplatelets: Secondary | ICD-10-CM | POA: Diagnosis not present

## 2017-10-13 DIAGNOSIS — Z79899 Other long term (current) drug therapy: Secondary | ICD-10-CM | POA: Diagnosis not present

## 2017-10-13 DIAGNOSIS — I481 Persistent atrial fibrillation: Secondary | ICD-10-CM | POA: Insufficient documentation

## 2017-10-13 DIAGNOSIS — Z87891 Personal history of nicotine dependence: Secondary | ICD-10-CM | POA: Diagnosis not present

## 2017-10-13 DIAGNOSIS — F419 Anxiety disorder, unspecified: Secondary | ICD-10-CM | POA: Insufficient documentation

## 2017-10-13 DIAGNOSIS — E039 Hypothyroidism, unspecified: Secondary | ICD-10-CM | POA: Diagnosis not present

## 2017-10-13 HISTORY — PX: CARDIOVERSION: EP1203

## 2017-10-13 SURGERY — CARDIOVERSION (CATH LAB)
Anesthesia: General

## 2017-10-13 MED ORDER — MIDAZOLAM HCL 2 MG/2ML IJ SOLN
INTRAMUSCULAR | Status: AC
  Filled 2017-10-13: qty 2

## 2017-10-13 MED ORDER — PROPOFOL 10 MG/ML IV BOLUS
INTRAVENOUS | Status: DC | PRN
  Administered 2017-10-13: 30 mg via INTRAVENOUS
  Administered 2017-10-13 (×2): 20 mg via INTRAVENOUS

## 2017-10-13 MED ORDER — EPHEDRINE SULFATE 50 MG/ML IJ SOLN
INTRAMUSCULAR | Status: AC
  Filled 2017-10-13: qty 1

## 2017-10-13 MED ORDER — MIDAZOLAM HCL 2 MG/2ML IJ SOLN
INTRAMUSCULAR | Status: DC | PRN
  Administered 2017-10-13: 1 mg via INTRAVENOUS

## 2017-10-13 MED ORDER — PROPOFOL 10 MG/ML IV BOLUS
INTRAVENOUS | Status: AC
  Filled 2017-10-13: qty 20

## 2017-10-13 MED ORDER — FENTANYL CITRATE (PF) 100 MCG/2ML IJ SOLN
INTRAMUSCULAR | Status: AC
  Filled 2017-10-13: qty 2

## 2017-10-13 MED ORDER — SODIUM CHLORIDE 0.9 % IV SOLN
INTRAVENOUS | Status: DC | PRN
  Administered 2017-10-13: 08:00:00 via INTRAVENOUS

## 2017-10-13 NOTE — Transfer of Care (Signed)
Immediate Anesthesia Transfer of Care Note  Patient: Meghan Cole  Procedure(s) Performed: CARDIOVERSION (N/A )  Patient Location: PACU  Anesthesia Type:General  Level of Consciousness: awake  Airway & Oxygen Therapy: Patient Spontanous Breathing and Patient connected to nasal cannula oxygen  Post-op Assessment: Report given to RN and Post -op Vital signs reviewed and stable  Post vital signs: Reviewed and stable  Last Vitals:  Vitals Value Taken Time  BP    Temp    Pulse 72 10/13/2017  7:29 AM  Resp 19 10/13/2017  7:29 AM  SpO2 98 % 10/13/2017  7:29 AM  Vitals shown include unvalidated device data.  Last Pain:  Vitals:   10/13/17 0720  TempSrc: Oral         Complications: No apparent anesthesia complications

## 2017-10-13 NOTE — Anesthesia Preprocedure Evaluation (Signed)
Anesthesia Evaluation  Patient identified by MRN, date of birth, ID band Patient awake    Reviewed: Allergy & Precautions, H&P , NPO status , Patient's Chart, lab work & pertinent test results, reviewed documented beta blocker date and time   Airway Mallampati: II   Neck ROM: full    Dental  (+) Poor Dentition   Pulmonary neg pulmonary ROS, former smoker,    Pulmonary exam normal        Cardiovascular Exercise Tolerance: Good hypertension, On Medications negative cardio ROS Normal cardiovascular examAtrial Fibrillation  Rhythm:regular Rate:Normal     Neuro/Psych negative neurological ROS  negative psych ROS   GI/Hepatic negative GI ROS, Neg liver ROS,   Endo/Other  negative endocrine ROSHypothyroidism   Renal/GU negative Renal ROS  negative genitourinary   Musculoskeletal   Abdominal   Peds  Hematology negative hematology ROS (+)   Anesthesia Other Findings Past Medical History: No date: Atrial septal defect No date: Heart disease No date: Hypertension No date: Thyroid disease Past Surgical History: No date: ATRIAL SEPTAL DEFECT(ASD) CLOSURE No date: COLONOSCOPY WITH PROPOFOL     Comment:  2011 No date: EXPLORATION POST OPERATIVE OPEN HEART BMI    Body Mass Index:  37.59 kg/m     Reproductive/Obstetrics negative OB ROS                             Anesthesia Physical Anesthesia Plan  ASA: IV  Anesthesia Plan: General   Post-op Pain Management:    Induction:   PONV Risk Score and Plan:   Airway Management Planned:   Additional Equipment:   Intra-op Plan:   Post-operative Plan:   Informed Consent: I have reviewed the patients History and Physical, chart, labs and discussed the procedure including the risks, benefits and alternatives for the proposed anesthesia with the patient or authorized representative who has indicated his/her understanding and acceptance.    Dental Advisory Given  Plan Discussed with: CRNA  Anesthesia Plan Comments:         Anesthesia Quick Evaluation

## 2017-10-13 NOTE — CV Procedure (Signed)
Cardioversion procedure note For atrial fibrillation, persistent.  Procedure Details:  Consent: Risks of procedure as well as the alternatives and risks of each were explained to the (patient/caregiver). Consent for procedure obtained.  Time Out: Verified patient identification, verified procedure, site/side was marked, verified correct patient position, special equipment/implants available, medications/allergies/relevent history reviewed, required imaging and test results available. Performed  Patient placed on cardiac monitor, pulse oximetry, supplemental oxygen as necessary.  Sedation given: propofol IV, Dr. Pernell Dupre Pacer pads placed anterior and posterior chest.   Cardioverted 2 time(s).  Cardioverted at 150 and 200 J. Synchronized biphasic Converted to NSR   Evaluation: Findings: Post procedure EKG shows: NSR Complications: None Patient did tolerate procedure well.  Time Spent Directly with the Patient:  45 minutes   Dossie Arbour, M.D., Ph.D.

## 2017-10-13 NOTE — Anesthesia Post-op Follow-up Note (Signed)
Anesthesia QCDR form completed.        

## 2017-10-13 NOTE — H&P (Signed)
H&P Addendum, pre-cardioversion  Patient was seen and evaluated prior to -cardioversion procedure Symptoms, prior testing details again confirmed with the patient Patient examined, no significant change from prior exam Lab work reviewed in detail personally by myself Patient understands risk and benefit of the procedure, willing to proceed  Signed, Tim Olivier Frayre, MD, Ph.D [facility]  

## 2017-10-13 NOTE — Anesthesia Postprocedure Evaluation (Signed)
Anesthesia Post Note  Patient: Meghan Cole  Procedure(s) Performed: CARDIOVERSION (N/A )  Patient location during evaluation: PACU Anesthesia Type: General Level of consciousness: awake and alert Pain management: pain level controlled Vital Signs Assessment: post-procedure vital signs reviewed and stable Respiratory status: spontaneous breathing, nonlabored ventilation, respiratory function stable and patient connected to nasal cannula oxygen Cardiovascular status: blood pressure returned to baseline and stable Postop Assessment: no apparent nausea or vomiting Anesthetic complications: no     Last Vitals:  Vitals:   10/13/17 0815 10/13/17 0826  BP: 110/83 105/66  Pulse: 62 63  Resp: 20   SpO2: 97% 97%    Last Pain:  Vitals:   10/13/17 0720  TempSrc: Oral                 Yevette Edwards

## 2017-10-13 NOTE — Anesthesia Procedure Notes (Signed)
Date/Time: 10/13/2017 7:33 AM Performed by: Henrietta Hoover, CRNA Pre-anesthesia Checklist: Patient identified, Emergency Drugs available, Suction available, Patient being monitored and Timeout performed Patient Re-evaluated:Patient Re-evaluated prior to induction Oxygen Delivery Method: Nasal cannula Placement Confirmation: positive ETCO2 Dental Injury: Teeth and Oropharynx as per pre-operative assessment

## 2017-10-14 ENCOUNTER — Encounter: Payer: Self-pay | Admitting: Cardiovascular Disease

## 2017-10-19 ENCOUNTER — Encounter: Payer: Self-pay | Admitting: Cardiovascular Disease

## 2017-10-21 ENCOUNTER — Encounter: Payer: Self-pay | Admitting: Cardiovascular Disease

## 2017-11-03 ENCOUNTER — Encounter: Payer: Self-pay | Admitting: Cardiovascular Disease

## 2017-11-05 ENCOUNTER — Encounter: Payer: BC Managed Care – PPO | Admitting: Nurse Practitioner

## 2017-11-08 ENCOUNTER — Ambulatory Visit (INDEPENDENT_AMBULATORY_CARE_PROVIDER_SITE_OTHER): Payer: BC Managed Care – PPO | Admitting: Nurse Practitioner

## 2017-11-08 ENCOUNTER — Encounter: Payer: Self-pay | Admitting: Nurse Practitioner

## 2017-11-08 ENCOUNTER — Other Ambulatory Visit: Payer: Self-pay

## 2017-11-08 VITALS — BP 140/75 | HR 66 | Temp 98.3°F | Ht 67.0 in | Wt 243.2 lb

## 2017-11-08 DIAGNOSIS — Z1231 Encounter for screening mammogram for malignant neoplasm of breast: Secondary | ICD-10-CM

## 2017-11-08 DIAGNOSIS — Z124 Encounter for screening for malignant neoplasm of cervix: Secondary | ICD-10-CM

## 2017-11-08 DIAGNOSIS — R400 Somnolence: Secondary | ICD-10-CM

## 2017-11-08 DIAGNOSIS — I4819 Other persistent atrial fibrillation: Secondary | ICD-10-CM

## 2017-11-08 DIAGNOSIS — Z9189 Other specified personal risk factors, not elsewhere classified: Secondary | ICD-10-CM

## 2017-11-08 DIAGNOSIS — R29818 Other symptoms and signs involving the nervous system: Secondary | ICD-10-CM

## 2017-11-08 DIAGNOSIS — Z1239 Encounter for other screening for malignant neoplasm of breast: Secondary | ICD-10-CM

## 2017-11-08 DIAGNOSIS — I481 Persistent atrial fibrillation: Secondary | ICD-10-CM

## 2017-11-08 DIAGNOSIS — Z0000 Encounter for general adult medical examination without abnormal findings: Secondary | ICD-10-CM

## 2017-11-08 DIAGNOSIS — E669 Obesity, unspecified: Secondary | ICD-10-CM

## 2017-11-08 DIAGNOSIS — Z23 Encounter for immunization: Secondary | ICD-10-CM

## 2017-11-08 DIAGNOSIS — Z8619 Personal history of other infectious and parasitic diseases: Secondary | ICD-10-CM

## 2017-11-08 NOTE — Progress Notes (Signed)
Subjective:    Patient ID: Meghan Cole, female    DOB: 07-17-1950, 67 y.o.   MRN: [id number]  Meghan Cole is a 67 y.o. female presenting on 11/08/2017 for Annual Exam   HPI Annual Physical Exam Patient has been feeling well overall, but less energy than prior to new afib diagnosis.  Pt has several acute concerns today.  Persistent Afib/Epistaxis on anticoag   Has persistent afib since May 2018.  Cardioversion not successful.  R nare with nose bleed after Eliquis. Has started using packing strips for nose bleeds.  GERD If eats anything after 3:30 pm has significant heartburn.  Would like H.Pylori testing.  She is not currently taking any PPI or H2 blocker for symptoms.  Is working on eating healthier diet and weight loss.  Pt partially attributes weight loss to avoidance of evening meal.  Concern for sleep apnea - Possible exacerbation of afib. Was instructed by Dr. Mariah Milling in past to consider sleep study to rule out possible factor for afib. - Daytime sleepiness is present - Awakens 2 am, pulse ox at 88-89% when waking up at night.  Has concerns for sleep lab and inability to fall asleep there.  Would prefer home study. - Sleeps about 7 hours per night interrupted approx 3 times with occasional difficulty with returning to sleep.  Sometimes 1-2 times per night she is gasping for air when waking.  Not waking for bathroom usually.   HEALTH MAINTENANCE: Weight/BMI: slowly reducing weight over last month - is now under cardiac rehab tomorrow Physical activity: Cardiac rehab - is difficult  Diet: Meals out 3 x per week and rarely fried foods. Seatbelt: always Sunscreen: usually PAP: abnormal pap about 32 yrs ago - all since are normal Mammogram: due - pt initially declines DEXA: done in past - normal per pt report Colon Cancer Screen: declines - last colonoscopy negative without polyps.  Pt will defer to Cologuard in October with Medicare. HIV/HEP C: requests hep c recheck Optometry:  regular Dentistry: regular  VACCINES: Tetanus: last given around 2007 - due Shingles: first dose 10/23/2016, 2nd dose was at [facility]   Past Medical History:  Diagnosis Date  . Atrial septal defect   . Heart disease   . Hypertension   . Thyroid disease    Past Surgical History:  Procedure Laterality Date  . ATRIAL SEPTAL DEFECT(ASD) CLOSURE    . CARDIOVERSION N/A 10/13/2017   Procedure: CARDIOVERSION;  Surgeon: Antonieta Iba, MD;  Location: [facility] ORS;  Service: Cardiovascular;  Laterality: N/A;  . COLONOSCOPY WITH PROPOFOL     2011  . EXPLORATION POST OPERATIVE OPEN HEART     Social History   Socioeconomic History  . Marital status: Married    Spouse name: Talita Recht  . Number of children: Not on file  . Years of education: Charity fundraiser  . Highest education level: Not on file  Occupational History  . 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 Financial resource strain: Not on file  . Food insecurity:    Worry: Not on file    Inability: Not on file  . Transportation needs:    Medical: Not on file    Non-medical: Not on file  Tobacco Use  . Smoking status: Former Games developer  . Smokeless tobacco: Never Used  Substance and Sexual Activity  . Alcohol use: No  . Drug use: No  . Sexual activity: Not on  file  Lifestyle  . Physical activity:    Days per week: Not on file    Minutes per session: Not on file  . Stress: Not on file  Relationships  . Social connections:    Talks on phone: Not on file    Gets together: Not on file    Attends religious service: Not on file    Active member of club or organization: Not on file    Attends meetings of clubs or organizations: Not on file    Relationship status: Not on file  . Intimate partner violence:    Fear of current or ex partner: Not on file    Emotionally abused: Not on file    Physically abused: Not on file    Forced sexual activity: Not on file    Other Topics Concern  . Not on file  Social History Narrative  . Not on file   Family History  Problem Relation Age of Onset  . Cancer Mother        melanoma  . Heart disease Father   . Heart attack Father   . Breast cancer Paternal Grandmother 30   Current Outpatient Medications on File Prior to Visit  Medication Sig  . diltiazem (CARDIZEM) 30 MG tablet Take 30 mg by mouth 3 (three) times daily.  Marland Kitchen ELIQUIS 5 MG TABS tablet TAKE 1 TABLET BY MOUTH TWICE DAILY  . levothyroxine (SYNTHROID, LEVOTHROID) 50 MCG tablet Take 1 tablet (50 mcg total) by mouth daily before breakfast.  . Magnesium 500 MG TABS Take 200 mg by mouth every other day.   . Melatonin ER 5 MG TBCR Take 5 mg by mouth at bedtime.   . metoprolol succinate (TOPROL-XL) 25 MG 24 hr tablet Take 25 mg by mouth daily. Take with or immediately following a meal.  . propafenone (RYTHMOL) 150 MG tablet Take 1 tablet (150 mg total) by mouth every 8 (eight) hours.   No current facility-administered medications on file prior to visit.     Review of Systems  Constitutional: Positive for fatigue.  HENT: Positive for nosebleeds.   Eyes: Negative.   Respiratory: Positive for shortness of breath (occasionally wakes at night).   Cardiovascular: Positive for leg swelling (improves by morning).  Gastrointestinal: Positive for abdominal pain (heartburn (see HPI)).  Endocrine: Negative.   Genitourinary: Positive for vaginal pain (irritation and discomfort).       Occasional stress incontinence  Musculoskeletal: Negative.   Skin: Negative.   Allergic/Immunologic: Negative.   Neurological: Negative.   Hematological: Bruises/bleeds easily (on Eliquis).  Psychiatric/Behavioral: Negative.    Per HPI unless specifically indicated above     Objective:    BP 140/75 (BP Location: Right Arm, Patient Position: Sitting, Cuff Size: Large)   Pulse 66   Temp 98.3 F (36.8 C) (Oral)   Ht 5\' 7"  (1.702 m)   Wt 243 lb 3.2 oz (110.3 kg)   BMI  38.09 kg/m   Wt Readings from Last 3 Encounters:  10/13/17 240 lb (108.9 kg)  09/21/17 247 lb 12 oz (112.4 kg)  08/17/17 246 lb 8 oz (111.8 kg)    Physical Exam  Constitutional: She is oriented to person, place, and time. She appears well-developed and well-nourished. No distress.  HENT:  Head: Normocephalic and atraumatic.  Right Ear: Hearing, tympanic membrane, external ear and ear canal normal.  Left Ear: Hearing, tympanic membrane, external ear and ear canal normal.  Nose: Nose normal. No nose lacerations. No epistaxis.  Mouth/Throat: Uvula  is midline, oropharynx is clear and moist and mucous membranes are normal. Tonsils are 0 on the right. Tonsils are 0 on the left.  Eyes: Pupils are equal, round, and reactive to light. Conjunctivae, EOM and lids are normal.  Neck: Normal range of motion. Neck supple. No JVD present. No tracheal deviation present. No thyromegaly present.  Cardiovascular: Normal rate, normal heart sounds and intact distal pulses. An irregularly irregular rhythm present. Exam reveals no gallop and no friction rub.  No murmur heard. Pulses:      Radial pulses are 2+ on the right side.       Dorsalis pedis pulses are 2+ on the right side.       Posterior tibial pulses are 2+ on the right side.  Varicose veins bilateral lower extremities, non-engorged, trace pedal edema  Pulmonary/Chest: Effort normal and breath sounds normal. No respiratory distress.  Breast - Normal exam w/ symmetric breasts, no mass, no nipple discharge, no skin changes or tenderness.   Abdominal: Soft. Bowel sounds are normal. She exhibits no distension. There is no tenderness.  Genitourinary:  Genitourinary Comments: Normal external female genitalia without lesions or fusion. Vaginal canal without lesions. Atrophy of vaginal tissues and labia minora are present as expected for postmenopausal woman.  Normal appearing cervix without lesions or friability. Physiologic discharge on exam. Bimanual exam  without adnexal masses, enlarged uterus, or cervical motion tenderness.  Musculoskeletal: Normal range of motion.  Lymphadenopathy:    She has no cervical adenopathy.  Neurological: She is alert and oriented to person, place, and time. No cranial nerve deficit.  Skin: Skin is warm and dry.  Psychiatric: She has a normal mood and affect. Her speech is normal and behavior is normal. Judgment and thought content normal. Cognition and memory are normal.  Nursing note and vitals reviewed.   Results for orders placed or performed in visit on 09/21/17  Basic metabolic panel  Result Value Ref Range   Glucose 93 65 - 99 mg/dL   BUN 10 8 - 27 mg/dL   Creatinine, Ser 2.130.82 0.57 - 1.00 mg/dL   GFR calc non Af Amer 76 >59 mL/min/1.73   GFR calc Af Amer 87 >59 mL/min/1.73   BUN/Creatinine Ratio 12 12 - 28   Sodium 140 134 - 144 mmol/L   Potassium 4.8 3.5 - 5.2 mmol/L   Chloride 102 96 - 106 mmol/L   CO2 25 20 - 29 mmol/L   Calcium 9.8 8.7 - 10.3 mg/dL  CBC with Differential/Platelet  Result Value Ref Range   WBC 5.4 3.4 - 10.8 x10E3/uL   RBC 4.28 3.77 - 5.28 x10E6/uL   Hemoglobin 12.7 11.1 - 15.9 g/dL   Hematocrit 08.639.4 57.834.0 - 46.6 %   MCV 92 79 - 97 fL   MCH 29.7 26.6 - 33.0 pg   MCHC 32.2 31.5 - 35.7 g/dL   RDW 46.914.4 62.912.3 - 52.815.4 %   Platelets 245 150 - 379 x10E3/uL   Neutrophils 58 Not Estab. %   Lymphs 28 Not Estab. %   Monocytes 10 Not Estab. %   Eos 2 Not Estab. %   Basos 2 Not Estab. %   Neutrophils Absolute 3.1 1.4 - 7.0 x10E3/uL   Lymphocytes Absolute 1.5 0.7 - 3.1 x10E3/uL   Monocytes Absolute 0.6 0.1 - 0.9 x10E3/uL   EOS (ABSOLUTE) 0.1 0.0 - 0.4 x10E3/uL   Basophils Absolute 0.1 0.0 - 0.2 x10E3/uL   Immature Granulocytes 0 Not Estab. %   Immature Grans (Abs) 0.0  0.0 - 0.1 x10E3/uL      Assessment & Plan:   Problem List Items Addressed This Visit    None    Visit Diagnoses    Encounter for annual physical exam    -  Primary   Relevant Orders   TSH   VITAMIN D 25  Hydroxy (Vit-D Deficiency, Fractures)   Lipid panel   Hemoglobin A1c   CBC with Differential/Platelet   H. pylori breath test   Comprehensive Metabolic Panel (CMET)   History of hepatitis C       Relevant Orders   HCV RNA quant   Breast cancer screening       Relevant Orders   MM DIGITAL SCREENING BILATERAL   Need for Tdap vaccination       Relevant Orders   Tdap vaccine greater than or equal to 7yo IM   Cervical cancer screening       Relevant Orders   Pap IG and HPV (high risk) DNA detection   Persistent atrial fibrillation (HCC)       Relevant Orders   Home sleep test   At risk for sleep apnea       Relevant Orders   Home sleep test   Daytime sleepiness       Relevant Orders   Home sleep test    #1: Physical exam with no new findings.  Well adult with acute concerns as described above. Plan: 1. Obtain health maintenance screenings as above according to age (labs, PAP). - Recheck Hep C RNA 2/2 patient preference to confirm continued absence of active Hep C and access to labs via [facility] benefits. - Increase physical activity to 30 minutes most days of the week.  - Eat healthy diet high in vegetables and fruits; low in refined carbohydrates. 2.  Administer Tdap today as has been > 10 years since last booster. 3. Return 1 year for Welcome to Medicare wellness visit.  #2: Persistent atrial Fibrillation: Pt is rate controlled on eliquis for anticoagulation. Followed by Dr. Mariah Milling and recently [facility] at [facility] Dr. Herbert Deaner.  Continue Metoprolol XL.  Continue consideration of ablation procedure and followup with Dr. Herbert Deaner at [facility] as a decision is made.  #3: At risk for sleep apnea: Pt with persistent afib and possible correlation to undiagnosed obstructive sleep apnea.  Pt is reporting daytime sleepiness and nighttime awakenings with gasping for air and decreased oxygen saturation to 88-89% when checking once awake.  Pt reports oxygen saturation always higher and was 96% on exam  today. - Plan home sleep study with [facility].  May be contingent upon insurance preference for sleep lab, however. - Followup after results return and as needed if any observed apneas occur.     Follow up plan: Return in about 1 year (around 11/09/2018) for Welcome to Medicare Visit.  Wilhelmina Mcardle, DNP, AGPCNP-BC Adult Gerontology Primary Care Nurse Practitioner [facility] 11/08/2017, 8:29 AM

## 2017-11-08 NOTE — Patient Instructions (Addendum)
Meghan Cole,   Thank you for coming in to clinic today.  1. Continue working to increase your physical activity until you are increasing your heart rate and respiratory rate for 30 minutes on most days of the week.  Use Cardiac Rehab to learn the safe way for exercise.  2. You will be due for FASTING BLOOD WORK.  This means you should eat no food or drink after midnight.  Drink only water or coffee without cream/sugar on the morning of your lab visit. - Please go ahead and schedule a "Lab Only" visit at [facility] for lab draw in the next 7 days. - Your results will be available about 2-3 days after blood draw.  If you have set up a MyChart account, you can can log in to MyChart online to view your results and a brief explanation. Also, we can discuss your results together at your next office visit if you would like.  3. Your mammogram order has been placed.  Call the Scheduling phone number at [phone] to schedule your mammogram at your convenience.  You can choose to go to either location listed below.  Let the scheduler know which location you prefer.  [facility]  [facility]  [address]  [place], [place] [address]   [facility] [place] [facility] [address] [place], [place] [address]   4. You will get a call to set up your home sleep study.  Call [facility] if you have not heard anything in the next 2 weeks.  Please schedule a follow-up appointment with Wilhelmina McardleLauren Annica Marinello, AGNP. Return in about 1 year (around 11/09/2018) for Welcome to Medicare Visit.  If you have any other questions or concerns, please feel free to call the clinic or send a message through MyChart. You may also schedule an earlier appointment if necessary.  You will receive a survey after today's visit either digitally by e-mail or paper by [employer] mail. Your experiences and feedback matter to us.  Please respond so we know how we are  doing as we provide care for you.   Wilhelmina McardleLauren Delwin Raczkowski, DNP, AGNP-BC Adult Gerontology Nurse Practitioner [facility], [facility]

## 2017-11-09 ENCOUNTER — Encounter: Payer: Self-pay | Admitting: Cardiovascular Disease

## 2017-11-09 LAB — PAP IG AND HPV HIGH-RISK: HPV DNA High Risk: NOT DETECTED

## 2017-11-10 ENCOUNTER — Other Ambulatory Visit: Payer: Self-pay | Admitting: Nurse Practitioner

## 2017-11-10 DIAGNOSIS — E559 Vitamin D deficiency, unspecified: Secondary | ICD-10-CM

## 2017-11-10 DIAGNOSIS — E039 Hypothyroidism, unspecified: Secondary | ICD-10-CM

## 2017-11-10 MED ORDER — VITAMIN D 1000 UNITS PO TABS: 1000.0000 [IU] | ORAL_TABLET | Freq: Every day | ORAL | Status: DC

## 2017-11-10 MED ORDER — LEVOTHYROXINE SODIUM 50 MCG PO TABS: 50.0000 ug | ORAL_TABLET | Freq: Every day | ORAL | 1 refills | Status: DC

## 2017-11-11 LAB — CBC WITH DIFFERENTIAL/PLATELET
Basophils Absolute: 0.1 10*3/uL (ref 0.0–0.2)
Basos: 1 %
EOS (ABSOLUTE): 0.1 10*3/uL (ref 0.0–0.4)
Eos: 2 %
Hematocrit: 41.4 % (ref 34.0–46.6)
Hemoglobin: 14 g/dL (ref 11.1–15.9)
Immature Grans (Abs): 0 10*3/uL (ref 0.0–0.1)
Immature Granulocytes: 0 %
Lymphocytes Absolute: 1.5 10*3/uL (ref 0.7–3.1)
Lymphs: 33 %
MCH: 30.6 pg (ref 26.6–33.0)
MCHC: 33.8 g/dL (ref 31.5–35.7)
MCV: 91 fL (ref 79–97)
Monocytes Absolute: 0.5 10*3/uL (ref 0.1–0.9)
Monocytes: 10 %
Neutrophils Absolute: 2.5 10*3/uL (ref 1.4–7.0)
Neutrophils: 54 %
Platelets: 211 10*3/uL (ref 150–450)
RBC: 4.57 x10E6/uL (ref 3.77–5.28)
RDW: 14.5 % (ref 12.3–15.4)
WBC: 4.6 10*3/uL (ref 3.4–10.8)

## 2017-11-11 LAB — COMPREHENSIVE METABOLIC PANEL
ALT: 15 IU/L (ref 0–32)
AST: 23 IU/L (ref 0–40)
Albumin/Globulin Ratio: 1.5 (ref 1.2–2.2)
Albumin: 4.5 g/dL (ref 3.6–4.8)
Alkaline Phosphatase: 56 IU/L (ref 39–117)
BUN/Creatinine Ratio: 15 (ref 12–28)
BUN: 11 mg/dL (ref 8–27)
Bilirubin Total: 0.8 mg/dL (ref 0.0–1.2)
CO2: 25 mmol/L (ref 20–29)
Calcium: 9.6 mg/dL (ref 8.7–10.3)
Chloride: 102 mmol/L (ref 96–106)
Creatinine, Ser: 0.71 mg/dL (ref 0.57–1.00)
GFR calc Af Amer: 104 mL/min/{1.73_m2} (ref >59)
GFR calc non Af Amer: 90 mL/min/{1.73_m2} (ref >59)
Globulin, Total: 3 g/dL (ref 1.5–4.5)
Glucose: 86 mg/dL (ref 65–99)
Potassium: 5 mmol/L (ref 3.5–5.2)
Sodium: 140 mmol/L (ref 134–144)
Total Protein: 7.5 g/dL (ref 6.0–8.5)

## 2017-11-11 LAB — LIPID PANEL
Chol/HDL Ratio: 2.8 ratio (ref 0.0–4.4)
Cholesterol, Total: 179 mg/dL (ref 100–199)
HDL: 63 mg/dL (ref >39)
LDL Calculated: 99 mg/dL (ref 0–99)
Triglycerides: 86 mg/dL (ref 0–149)
VLDL Cholesterol Cal: 17 mg/dL (ref 5–40)

## 2017-11-11 LAB — HCV RNA QUANT: Hepatitis C Quantitation: NOT DETECTED IU/mL

## 2017-11-11 LAB — HEMOGLOBIN A1C
Est. average glucose Bld gHb Est-mCnc: 108 mg/dL
Hgb A1c MFr Bld: 5.4 % (ref 4.8–5.6)

## 2017-11-11 LAB — H. PYLORI BREATH TEST: H pylori Breath Test: NEGATIVE

## 2017-11-11 LAB — TSH: TSH: 1.3 u[IU]/mL (ref 0.450–4.500)

## 2017-11-11 LAB — VITAMIN D 25 HYDROXY (VIT D DEFICIENCY, FRACTURES): Vit D, 25-Hydroxy: 28.1 ng/mL — ABNORMAL LOW (ref 30.0–100.0)

## 2017-11-12 ENCOUNTER — Encounter: Payer: Self-pay | Admitting: Nurse Practitioner

## 2017-11-22 ENCOUNTER — Encounter: Payer: Self-pay | Admitting: Nurse Practitioner

## 2017-11-22 NOTE — Telephone Encounter (Signed)
Can you check on status of the home sleep study ordered by Lauren on 11/08/17? If you can find an update, please respond to patient on MyChart. Thanks!  Meghan PilarAlexander Karamalegos, DO [facility] 11/22/2017, 6:03 PM

## 2017-11-25 DIAGNOSIS — G4733 Obstructive sleep apnea (adult) (pediatric): Secondary | ICD-10-CM | POA: Insufficient documentation

## 2017-11-25 NOTE — Progress Notes (Addendum)
Addendum reviewed note, upon signing off on repeat Home PSG order through [facility]. Based on documentation, I have updated and added thWashingtone dx code Suspected Sleep Apnea (R29.818) as indicated dx to order PSG.  Also risk factor added Obesity BMI >38.  Problem List Items Addressed This Visit    Obesity (BMI 35.0-39.9 without comorbidity) See A&P under Wilhelmina McardleLauren Kennedy, AGPCNP-BC original note for lifestyle counseling    Suspected sleep apnea Proceed with home PSG given risk factors and history See A&P and note below for supporting information         Saralyn PilarAlexander Karamalegos, DO [facility] 11/25/2017, 1:40 PM

## 2017-12-22 ENCOUNTER — Other Ambulatory Visit: Payer: Self-pay | Admitting: *Deleted

## 2017-12-22 ENCOUNTER — Encounter: Payer: Self-pay | Admitting: Family Medicine

## 2017-12-22 ENCOUNTER — Other Ambulatory Visit: Payer: Self-pay | Admitting: Family Medicine

## 2017-12-22 DIAGNOSIS — I10 Essential (primary) hypertension: Secondary | ICD-10-CM

## 2017-12-22 MED ORDER — DILTIAZEM HCL 30 MG PO TABS: 30.0000 mg | ORAL_TABLET | Freq: Every evening | ORAL | 2 refills | Status: DC | PRN

## 2017-12-22 NOTE — Telephone Encounter (Signed)
Mychart message

## 2017-12-29 ENCOUNTER — Encounter: Payer: Self-pay | Admitting: Family Medicine

## 2018-01-07 ENCOUNTER — Ambulatory Visit (INDEPENDENT_AMBULATORY_CARE_PROVIDER_SITE_OTHER): Payer: BC Managed Care – PPO | Admitting: Family Medicine

## 2018-01-07 ENCOUNTER — Encounter: Payer: Self-pay | Admitting: Family Medicine

## 2018-01-07 VITALS — BP 132/74 | HR 68 | Temp 98.0°F | Resp 16 | Ht 67.0 in | Wt 237.0 lb

## 2018-01-07 DIAGNOSIS — N390 Urinary tract infection, site not specified: Secondary | ICD-10-CM | POA: Diagnosis not present

## 2018-01-07 LAB — POCT URINALYSIS DIPSTICK
BILIRUBIN UA: NEGATIVE
GLUCOSE UA: NEGATIVE
KETONES UA: NEGATIVE
Leukocytes, UA: NEGATIVE
Nitrite, UA: NEGATIVE
PH UA: 5 (ref 5.0–8.0)
PROTEIN UA: NEGATIVE
RBC UA: NEGATIVE
Spec Grav, UA: 1.01 (ref 1.010–1.025)
UROBILINOGEN UA: 0.2 U/dL

## 2018-01-07 MED ORDER — NITROFURANTOIN MONOHYD MACRO 100 MG PO CAPS: 100.0000 mg | ORAL_CAPSULE | Freq: Two times a day (BID) | ORAL | 0 refills | Status: DC

## 2018-01-07 NOTE — Progress Notes (Signed)
Subjective:    Patient ID: Meghan RiisDana Oftedahl, female    DOB: 1951/01/04, 67 y.o.   MRN: [id number]  Meghan Cole is a 67 y.o. female presenting on 01/07/2018 for Urinary Tract Infection (onset 2 days)  Patient presents for a same day appointment.  HPI   UTI Reports symptoms started 2 days ago on Wednesday, onset with typical UTI symptoms, urinary frequency and reduced urine output, with some associated suprapubic pressure and some urinary odor, and occasional dysuria. She started Pyridium initially that same day of onset with some temporary relief, took a few more doses over pyridium over past 2 days. Started yesterday she started a remaining supply of Macrobid antibiotic from before, took two doses yesterday, and one dose today so far. She has a few more day supply left. She checked a dipstick at home and showed positive nitrites and leukocytes. - Today she reports overall improved somewhat - less urinary frequency. She has not needed pyridium today - Admits some occasional chills without sweats or fever - Denies hematuria, abdominal or flank pain, fevers   [facility] 01/07/2018 11/08/2017 09/16/2016  Decreased Interest 0 0 0  Down, Depressed, Hopeless 0 0 0  PHQ - 2 Score 0 0 0  Altered sleeping 0 0 -  Tired, decreased energy 0 3 -  Change in appetite 0 2 -  Feeling bad or failure about yourself  0 0 -  Trouble concentrating 0 0 -  Moving slowly or fidgety/restless 0 0 -  Suicidal thoughts 0 0 -  PHQ-9 Score 0 5 -  Difficult doing work/chores Not difficult at all Not difficult at all -    Social History   Tobacco Use  . Smoking status: Former Games developermoker  . Smokeless tobacco: Never Used  Substance Use Topics  . Alcohol use: No  . Drug use: No    Review of Systems Per HPI unless specifically indicated above     Objective:    BP 132/74   Pulse 68   Temp 98 F (36.7 C) (Oral)   Resp 16   Ht 5\' 7"  (1.702 m)   Wt 237 lb (107.5 kg)   BMI 37.12 kg/m   Wt Readings  from Last 3 Encounters:  01/07/18 237 lb (107.5 kg)  11/08/17 243 lb 3.2 oz (110.3 kg)  10/13/17 240 lb (108.9 kg)    Physical Exam  Constitutional: She is oriented to person, place, and time. She appears well-developed and well-nourished. No distress.  Well-appearing, comfortable, cooperative  HENT:  Head: Normocephalic and atraumatic.  Mouth/Throat: Oropharynx is clear and moist.  Eyes: Conjunctivae are normal. Right eye exhibits no discharge. Left eye exhibits no discharge.  Cardiovascular: Normal rate.  Pulmonary/Chest: Effort normal.  Musculoskeletal: She exhibits no edema.  Neurological: She is alert and oriented to person, place, and time.  Skin: Skin is warm and dry. No rash noted. She is not diaphoretic. No erythema.  Psychiatric: She has a normal mood and affect. Her behavior is normal.  Well groomed, good eye contact, normal speech and thoughts  Nursing note and vitals reviewed.  Results for orders placed or performed in visit on 01/07/18  POCT Urinalysis Dipstick  Result Value Ref Range   Color, UA amber    Clarity, UA clear    Glucose, UA Negative Negative   Bilirubin, UA Negative    Ketones, UA Negative    Spec Grav, UA 1.010 1.010 - 1.025   Blood, UA Negative    pH, UA 5.0  5.0 - 8.0   Protein, UA Negative Negative   Urobilinogen, UA 0.2 0.2 or 1.0 E.U./dL   Nitrite, UA Negative    Leukocytes, UA Negative Negative   Appearance clear    Odor none       Assessment & Plan:   Problem List Items Addressed This Visit    Urinary tract infection - Primary   Relevant Medications   nitrofurantoin, macrocrystal-monohydrate, (MACROBID) 100 MG capsule   Other Relevant Orders   POCT Urinalysis Dipstick (Completed)   Urine Culture      Clinically consistent with UTI and confirmed on recent UA, however started home macrobid for 1-2 days improve symptoms. No concern for pyelo today (no systemic symptoms, neg fever, back pain, n/v).  Plan: 1. UA here negative but  recent home UA was positive 2. Ordered Urine culture 3. Extend Macrobid course - 100mg  BID continue for 3-5 more days - added extra on rx for future if recurrence 4. Improve PO hydration 5. RTC if no improvement 1-2 weeks, red flags given to return sooner  Meds ordered this encounter  Medications  . nitrofurantoin, macrocrystal-monohydrate, (MACROBID) 100 MG capsule    Sig: Take 1 capsule (100 mg total) by mouth 2 (two) times daily. For 5-8 days pending on course    Dispense:  16 capsule    Refill:  0     Follow up plan: Return in about 1 week (around 01/14/2018), or if symptoms worsen or fail to improve, for UTI.   Saralyn Pilar, DO [facility] 01/08/2018, 12:40 AM

## 2018-01-07 NOTE — Patient Instructions (Addendum)
Thank you for coming to the office today.  Mostly likely You have a Urinary Tract Infection - this is very common, your symptoms are reassuring and you should get better within 1 week on the antibiotics - Finish Macrobid 100mg  2 times daily for total of up to 7 days, complete entire course, even if feeling better (finish existing rx and part of current) - We sent urine for a culture, we will call you within next few days if we need to change antibiotics - Please drink plenty of fluids, improve hydration over next 1 week  If symptoms worsening, developing nausea / vomiting, worsening back pain, fevers / chills / sweats, then please return for re-evaluation sooner.  Please schedule a Follow-up Appointment to: Return in about 1 week (around 01/14/2018), or if symptoms worsen or fail to improve, for UTI.  If you have any other questions or concerns, please feel free to call the office or send a message through MyChart. You may also schedule an earlier appointment if necessary.  Additionally, you may be receiving a survey about your experience at our office within a few days to 1 week by e-mail or mail. We value your feedback.  Saralyn PilarAlexander Haze Antillon, DO [facility]

## 2018-01-08 LAB — URINE CULTURE
MICRO NUMBER: 90916354
Result:: NO GROWTH
SPECIMEN QUALITY:: ADEQUATE

## 2018-01-21 ENCOUNTER — Encounter: Payer: Self-pay | Admitting: Nurse Practitioner

## 2018-01-21 DIAGNOSIS — N952 Postmenopausal atrophic vaginitis: Secondary | ICD-10-CM

## 2018-01-21 MED ORDER — ESTROGENS, CONJUGATED 0.625 MG/GM VA CREA: TOPICAL_CREAM | VAGINAL | 12 refills | Status: DC

## 2018-01-21 NOTE — Addendum Note (Signed)
Addended by: Wilhelmina McardleKENNEDY, Dimitra Woodstock R on: 01/21/2018 01:41 PM   Modules accepted: Orders

## 2018-03-01 ENCOUNTER — Telehealth: Payer: Self-pay | Admitting: Cardiovascular Disease

## 2018-03-01 NOTE — Telephone Encounter (Signed)
Pt c/o of Chest Pain: STAT if CP now or developed within 24 hours  1. Are you having CP right now? No   2. Are you experiencing any other symptoms (ex. SOB, nausea, vomiting, sweating)? No feels like a band around chest applying pressure   3. How long have you been experiencing CP? Last night   4. Is your CP continuous or coming and going? One episode   5. Have you taken Nitroglycerin? No  ?

## 2018-03-01 NOTE — Telephone Encounter (Signed)
Spoke with patient and reviewed her signs and symptoms. She states that she did increase her weights during work out yesterday and wonders if this pain could be from that. She did report that it resolved on its own. Reviewed with her signs and symptoms to monitor for which would require immediate evaluation in the ED. She verbalized understanding of our conversation, agreement with plan, and had no further questions at this time. Appointment scheduled for next week and she confirmed information.

## 2018-03-09 NOTE — Progress Notes (Signed)
Cardiology Office Note  Date:  03/11/2018   ID:  Meghan Cole, DOB January 18, 1951, MRN [id number]  PCP:  Galen Manila, NP   Chief Complaint  Patient presents with  . other    Chest pressure comes and goes.Medications reviewed verbally.    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 Compliant with anticoagulation/eliquis  Episodes of daytime malaise, chest pressure, tired, nausea She is concerned about ischemia Also concerned about her cardiac function No prior echocardiogram available on review of records  Prior stressors at home, husband with bypass surgery and valve disease Now he is doing better  Lab work reviewed with her in detail TSH 1.3 Hemoglobin A1c 5.4 Total cholesterol 179 LDL 99 Normal BMP and LFTs  EKG personally reviewed by myself on todays visit Shows atrial fibrillation rate 62 bpm nonspecific T wave abnormality anterolateral leads  Other past medical history reviewed Did not tolerate flecainide, some nausea  Previous event monitor  Documenting atrial fibrillation Several episodes of atrial fibrillation both on May 19, first episode appeared regular and rapid, second episode or irregular consistent with atrial fibrillation Atrial fib last   Sat, 10/24/2016 10:45 Am, rate 154, up to 174 on tele,  Back to NSR 12:45 Had episode at 5 PM  Father with CAD, MI,  atrial fib  PMH:   has a past medical history of Atrial septal defect, Heart disease, Hypertension, and Thyroid disease.  PSH:    Past Surgical History:  Procedure Laterality Date  . ATRIAL SEPTAL DEFECT(ASD) CLOSURE    . CARDIOVERSION N/A 10/13/2017   Procedure: CARDIOVERSION;  Surgeon: Antonieta Iba, MD;  Location: [facility] ORS;  Service: Cardiovascular;  Laterality: N/A;  . COLONOSCOPY WITH PROPOFOL     2011  . EXPLORATION POST OPERATIVE OPEN HEART      Current Outpatient Medications  Medication Sig Dispense Refill  . cholecalciferol (VITAMIN D) 1000 units tablet Take 1 tablet (1,000 Units total) by mouth daily.    Marland Kitchen conjugated estrogens (PREMARIN) vaginal cream Place fingertip amount at opening of vagina every night. 42.5 g 12  . diltiazem (CARDIZEM) 30 MG tablet Take 1 tablet (30 mg total) by mouth at bedtime as needed. 90 tablet 2  . hydrochlorothiazide (MICROZIDE) 12.5 MG capsule TAKE 1 CAPSULE BY MOUTH ONCE DAILY 30 capsule 5  . levothyroxine (SYNTHROID, LEVOTHROID) 50 MCG tablet Take 1 tablet (50 mcg total) by mouth daily before breakfast. 90 tablet 1  . Magnesium 500 MG TABS Take 200 mg by mouth every other day.     Marland Kitchen apixaban (ELIQUIS) 5 MG TABS tablet Take 1 tablet (5 mg total) by mouth 2 (two) times daily. 180 tablet 3  . metoprolol succinate (TOPROL-XL) 25 MG 24 hr tablet Take 1 tablet (25 mg total) by mouth 2 (two)  times daily. Take with or immediately following a meal. 180 tablet 3   No current facility-administered medications for this visit.      Allergies:   Patient has no known allergies.   Social History:  The patient  reports that she has quit smoking. She has never used smokeless tobacco. She reports that she does not drink alcohol or use drugs.   Family History:   family history includes Breast cancer (age of onset: 25) in her paternal  grandmother; Cancer in her mother; Heart attack in her father; Heart disease in her father.   Review of Systems: Review of Systems  Constitutional: Negative.   Respiratory: Negative.   Cardiovascular: Positive for chest pain and palpitations.       Tachycardia  Gastrointestinal: Negative.   Musculoskeletal: Negative.   Neurological: Negative.   Psychiatric/Behavioral: Negative.   All other systems reviewed and are negative.   PHYSICAL EXAM: VS:  BP 122/72 (BP Location: Left Arm, Patient Position: Sitting, Cuff Size: Normal)   Pulse 62   Ht 5\' 7"  (1.702 m)   Wt 235 lb 8 oz (106.8 kg)   BMI 36.88 kg/m  , BMI Body mass index is 36.88 kg/m. GEN: Well nourished, well developed, in no acute distress , obese HEENT: normal  Neck: no JVD, carotid bruits, or masses Cardiac: RRR; no murmurs, rubs, or gallops,no edema  Respiratory:  clear to auscultation bilaterally, normal work of breathing GI: soft, nontender, nondistended, + BS MS: no deformity or atrophy  Skin: warm and dry, no rash Neuro:  Strength and sensation are intact Psych: euthymic mood, full affect   Recent Labs: 11/09/2017: ALT 15; BUN 11; Creatinine, Ser 0.71; Hemoglobin 14.0; Platelets 211; Potassium 5.0; Sodium 140; TSH 1.300    Lipid Panel Lab Results  Component Value Date   CHOL 179 11/09/2017   HDL 63 11/09/2017   LDLCALC 99 11/09/2017   TRIG 86 11/09/2017      Wt Readings from Last 3 Encounters:  03/10/18 235 lb 8 oz (106.8 kg)  01/07/18 237 lb (107.5 kg)  11/08/17 243 lb 3.2 oz (110.3 kg)       ASSESSMENT AND PLAN:   Persistent atrial fibrillation Dating back to 2017  Eliquis 5 twice daily  on metoprolol  Chest pain Atypical in nature, There is an abnormal EKG with nonspecific ST and T wave abnormality anterior precordial leads.  Findings more pronounced today, were nonspecific May 2019, more diffuse across the precordium in April 2019, nonspecific in March 2019 Findings not present when  she was in sinus rhythm Few risk factors for coronary disease but worth evaluating Discussed stress testing versus CT coronary calcium scoring She is elected to proceed with CT coronary calcium score Further work-up may be needed for high score  Tachycardia Also with nausea, chest tightness Ischemic work-up with calcium scoring as above Echocardiogram also ordered  Essential hypertension - Blood pressure is well controlled on today's visit. No changes made to the medications.  Morbid obesity We have encouraged continued exercise, careful diet management in an effort to lose weight.  Disposition:   F/U  12 month  We will call her with the results of her CT coronary calcium score and echocardiogram   Total encounter time more than 25 minutes  Greater than 50% was spent in counseling and coordination of care with the patient    Orders Placed This Encounter  Procedures  . CT CARDIAC SCORING  . EKG 12-Lead  . ECHOCARDIOGRAM COMPLETE  Signed, Dossie Arbour, M.D., Ph.D. 03/11/2018  [facility] [place], [place] [phone]

## 2018-03-10 ENCOUNTER — Encounter: Payer: Self-pay | Admitting: Cardiovascular Disease

## 2018-03-10 ENCOUNTER — Ambulatory Visit (INDEPENDENT_AMBULATORY_CARE_PROVIDER_SITE_OTHER): Payer: Medicare Other | Admitting: Cardiovascular Disease

## 2018-03-10 DIAGNOSIS — B1920 Unspecified viral hepatitis C without hepatic coma: Secondary | ICD-10-CM

## 2018-03-10 DIAGNOSIS — R079 Chest pain, unspecified: Secondary | ICD-10-CM

## 2018-03-10 DIAGNOSIS — I4819 Other persistent atrial fibrillation: Secondary | ICD-10-CM | POA: Diagnosis not present

## 2018-03-10 DIAGNOSIS — I10 Essential (primary) hypertension: Secondary | ICD-10-CM

## 2018-03-10 DIAGNOSIS — R9431 Abnormal electrocardiogram [ECG] [EKG]: Secondary | ICD-10-CM

## 2018-03-10 NOTE — Patient Instructions (Addendum)
Medication Instructions:   No medication changes made  Labwork:  No new labs needed  Testing/Procedures:  We will order CT coronary calcium score $150 Family History     Please call [phone] to schedule   [facility] [address] [place], [place] [address]   We will order an echocardiogram: chest tightness, nausea, tachycardia   Follow-Up: It was a pleasure seeing you in the office today. Please call us if you have new issues that need to be addressed before your next appt.  [phone]  Your physician wants you to follow-up in: 12 months.  You will receive a reminder letter in the mail two months in advance. If you don't receive a letter, please call our office to schedule the follow-up appointment.  If you need a refill on your cardiac medications before your next appointment, please call your pharmacy.  For educational health videos Log in to : [email or web address] Or : [email or web address], password : [email or web address]

## 2018-03-11 ENCOUNTER — Ambulatory Visit: Payer: BC Managed Care – PPO | Admitting: Cardiovascular Disease

## 2018-03-11 ENCOUNTER — Other Ambulatory Visit: Payer: Self-pay | Admitting: *Deleted

## 2018-03-11 DIAGNOSIS — R079 Chest pain, unspecified: Secondary | ICD-10-CM

## 2018-03-11 DIAGNOSIS — B1920 Unspecified viral hepatitis C without hepatic coma: Secondary | ICD-10-CM | POA: Insufficient documentation

## 2018-03-11 DIAGNOSIS — R9431 Abnormal electrocardiogram [ECG] [EKG]: Secondary | ICD-10-CM | POA: Insufficient documentation

## 2018-03-11 HISTORY — DX: Chest pain, unspecified: R07.9

## 2018-03-11 MED ORDER — METOPROLOL SUCCINATE ER 25 MG PO TB24: 25.0000 mg | ORAL_TABLET | Freq: Two times a day (BID) | ORAL | 3 refills | Status: DC

## 2018-03-11 MED ORDER — APIXABAN 5 MG PO TABS: 5.0000 mg | ORAL_TABLET | Freq: Two times a day (BID) | ORAL | 3 refills | Status: DC

## 2018-03-14 ENCOUNTER — Ambulatory Visit: Payer: BC Managed Care – PPO | Admitting: Family Medicine

## 2018-03-21 ENCOUNTER — Ambulatory Visit (INDEPENDENT_AMBULATORY_CARE_PROVIDER_SITE_OTHER): Payer: Medicare Other

## 2018-03-21 ENCOUNTER — Other Ambulatory Visit: Payer: Self-pay

## 2018-03-21 DIAGNOSIS — R079 Chest pain, unspecified: Secondary | ICD-10-CM | POA: Diagnosis not present

## 2018-03-24 ENCOUNTER — Encounter: Payer: Self-pay | Admitting: Nurse Practitioner

## 2018-03-24 ENCOUNTER — Ambulatory Visit (INDEPENDENT_AMBULATORY_CARE_PROVIDER_SITE_OTHER): Payer: Medicare Other | Admitting: Nurse Practitioner

## 2018-03-24 ENCOUNTER — Other Ambulatory Visit: Payer: Self-pay

## 2018-03-24 VITALS — BP 124/70 | HR 60 | Temp 98.2°F | Wt 238.0 lb

## 2018-03-24 DIAGNOSIS — I10 Essential (primary) hypertension: Secondary | ICD-10-CM | POA: Diagnosis not present

## 2018-03-24 DIAGNOSIS — I4811 Longstanding persistent atrial fibrillation: Secondary | ICD-10-CM

## 2018-03-24 DIAGNOSIS — E039 Hypothyroidism, unspecified: Secondary | ICD-10-CM

## 2018-03-24 DIAGNOSIS — Z9189 Other specified personal risk factors, not elsewhere classified: Secondary | ICD-10-CM

## 2018-03-24 DIAGNOSIS — Z7901 Long term (current) use of anticoagulants: Secondary | ICD-10-CM

## 2018-03-24 MED ORDER — HYDROCHLOROTHIAZIDE 12.5 MG PO CAPS: 12.5000 mg | ORAL_CAPSULE | Freq: Every day | ORAL | 1 refills | Status: DC

## 2018-03-24 NOTE — Progress Notes (Signed)
Subjective:    Patient ID: Meghan Cole, female    DOB: 09/28/1950, 67 y.o.   MRN: [id number]  Meghan Cole is a 67 y.o. female presenting on 03/24/2018 for Vaginal Atrophy (the pt notice improvement since using ) and Atrial Fibrillation (Pt cardiologist requesting a sleep study )   HPI Vaginal Atrophy Patient is currently using premarin vaginal cream since 01/21/2018 prescription.  Patient has noted complete relief of urinary symptoms.  Is currently applying fingertip amount of premarin cream every other night.    Thyroid Hair falling out some, but attributes this to possibility of other causes (meds).  - levothyroxine 50 mcg in early am. - She denies, fatigue, excess energy, weight changes, heart racing, heart palpitations, heat and cold intolerance, changes in hair/skin/nails, and lower leg swelling.  - She does not have any compressive symptoms to include difficulty swallowing, globus sensation, or difficulty breathing when lying flat.   Sleep Study This was recommended by her cardiologist, ordered at home study that was not approved as insurance prefers sleep center study.  Patient continues having difficulty sleeping and is still concerned about sleep apnea impact on afib.  - Now waking 2x in the night.  Occasionally dreams she cannot breathe, wakes up gasping for air.  Does not happen all the time but is noticing this more regularly than she would like.  No observed apneas, but no regular family to observe.  Sisters report she snores badly.  With awakening and after using bathroom, is measuring high 80s on O2 Sat.    STOP-Bang OSA (scoring y/n) Snoring y   Tiredness y   Observed apneas n   Pressure HTN y   BMI > 35 kg/m2 y   Age > 25  y   Neck (female >17 in; Female >16 in)  n 16.5 In  Gender female n   OSA risk low (0-2)  OSA risk intermediate (3-4)  OSA risk high (5+)  Total: 5    Epworth Sleepiness Scale Patient's Answer Chance of dozing off under normal circumstances    Sitting and reading  3   Watching TV 2 0 = Never  Sitting inactive in a public place 2 1 = Slight chance  As a passenger in a car for an hour without a break 1 2 = Moderate chance  Lying down to rest in the afternoon when circumstances permit 3 3 = High chance  Sitting and talking to someone 0   Sitting quietly after a lunch without alcohol 2   In a car, while stopped for a few minutes in traffic 0                                                              Total Score: 13    0-7: It is unlikely that you are abnormally sleepy. 8-9: You have an average amount of daytime sleepiness. >9: POSITIVE - Recommend further evaluation, sleep specialist or sleep study (>16-24 = severe)  Afib, anticog Nosebleeds 2x per month, packs and uses afrin for control.  No other notice of bleeding.  Continued on Eliquis by cardiology for afib anticoag to reduce stroke risk.  No changes today.   Social History   Tobacco Use  . Smoking status: Former Games developer  . Smokeless tobacco: Never Used  Substance Use Topics  . Alcohol use: No  . Drug use: No    Review of Systems Per HPI unless specifically indicated above     Objective:    BP 124/70   Pulse 60   Temp 98.2 F (36.8 C) (Oral)   Wt 238 lb (108 kg)   BMI 37.28 kg/m   Wt Readings from Last 3 Encounters:  03/24/18 238 lb (108 kg)  03/10/18 235 lb 8 oz (106.8 kg)  01/07/18 237 lb (107.5 kg)    Physical Exam  Constitutional: She is oriented to person, place, and time. She appears well-developed and well-nourished. No distress.  HENT:  Head: Normocephalic and atraumatic.  Neck: Normal range of motion. Neck supple. No JVD present. Carotid bruit is not present. No thyromegaly present.  Cardiovascular: Normal rate, S1 normal, S2 normal, normal heart sounds and intact distal pulses. An irregularly irregular rhythm present.  Pulses:      Radial pulses are 2+ on the right side, and 2+ on the left side.       Posterior tibial pulses are 1+ on the  right side, and 1+ on the left side.  Pulmonary/Chest: Effort normal and breath sounds normal. No respiratory distress.  Musculoskeletal: She exhibits no edema (pedal).  Neurological: She is alert and oriented to person, place, and time.  Skin: Skin is warm and dry. Capillary refill takes less than 2 seconds.  Psychiatric: She has a normal mood and affect. Her behavior is normal. Judgment and thought content normal.  Vitals reviewed.   Results for orders placed or performed in visit on 01/07/18  Urine Culture  Result Value Ref Range   MICRO NUMBER: [id number]    SPECIMEN QUALITY: ADEQUATE    Sample Source URINE    STATUS: FINAL    Result: No Growth   POCT Urinalysis Dipstick  Result Value Ref Range   Color, UA amber    Clarity, UA clear    Glucose, UA Negative Negative   Bilirubin, UA Negative    Ketones, UA Negative    Spec Grav, UA 1.010 1.010 - 1.025   Blood, UA Negative    pH, UA 5.0 5.0 - 8.0   Protein, UA Negative Negative   Urobilinogen, UA 0.2 0.2 or 1.0 E.U./dL   Nitrite, UA Negative    Leukocytes, UA Negative Negative   Appearance clear    Odor none       Assessment & Plan:   Problem List Items Addressed This Visit      Cardiovascular and Mediastinum   Essential hypertension -  Primary Stable and well controlled.  Patient tolerating meds well without side effects. Continue hydrochlorothiazide.  Refill provided.  Labs today.  Sleep study ordered.  Follow-up 6 months.   Relevant Medications   hydrochlorothiazide (MICROZIDE) 12.5 MG capsule   Other Relevant Orders   BASIC METABOLIC PANEL WITH GFR   Basic Metabolic Panel (BMET)   PSG SLEEP STUDY    Other Visit Diagnoses    Vaginal Atrophy Improved and resolved on premarin cream every other day.  Encouraged patient to use as infrequently as possible to continue controlling symptoms.  May reduce to once weekly and increase to twice weekly as needed.  May also consider using prn or once every 2 weeks if sufficient.   Reviewed slightly increased risk of CV disease with topical estrogen although is lower than oral, so use least amount possible. Follow-up prn.  On anticoagulant therapy     With signs of bleeding/epistaxis.  Monitor CBC as this was not checked 2 weeks ago by Cardiology.  Previously Hgb/Hct normal.  Follow-up 6 months    Relevant Orders   CBC with Differential/Platelet   Acquired hypothyroidism     Stable symptoms. Needs recheck TSH.  Labs today.  Continue current dose levothyroxine 50 mcg once daily. Encouraged patient to take at least 30 mins before other meds and breakfast.  May not need dose change, but with afib TSH should be 1.0 - 3.5 preferably.  Follow-up 6 months.   Relevant Orders   TSH + free T4   Longstanding persistent atrial fibrillation     Continue hypertension management.  Will also [facility] to evaluate any extra causes for increased cardiac strain.  Order placed.  Patient remains at high risk for OSA with daytime sleepiness, hypertension, morbid obesity (BMI 35-40 with comorbidities).     Relevant Medications   hydrochlorothiazide (MICROZIDE) 12.5 MG capsule   Other Relevant Orders   PSG SLEEP STUDY   At risk for sleep apnea     See hypertension and afib AP.   Relevant Orders   PSG SLEEP STUDY      Meds ordered this encounter  Medications  . hydrochlorothiazide (MICROZIDE) 12.5 MG capsule    Sig: Take 1 capsule (12.5 mg total) by mouth daily.    Dispense:  90 capsule    Refill:  1    Order Specific Question:   Supervising Provider    Answer:   Smitty Cords [[id number]]   Follow up plan: Return in about 6 months (around 09/23/2018) for hypertension, thyroid.  Wilhelmina Mcardle, DNP, AGPCNP-BC Adult Gerontology Primary Care Nurse Practitioner [facility] 03/24/2018, 8:29 AM

## 2018-03-24 NOTE — Patient Instructions (Addendum)
Meghan Cole,   Thank you for coming in to clinic today.  1. Labs any time this week.  2. [facility] will call you to schedule after insurance verification.  3. Continue all medications without change for now.  Since you aren't having symptoms of low or high thyroid your levothyroxine will likely stay the same as well.  4. Continue premarin - use fingertip amount as little as 1 per week and up to 4 times per week with a GOAL to keep symptoms controlled.  Please schedule a follow-up appointment with Wilhelmina Mcardle, AGNP. Return in about 6 months (around 09/23/2018) for hypertension, thyroid.  If you have any other questions or concerns, please feel free to call the clinic or send a message through MyChart. You may also schedule an earlier appointment if necessary.  You will receive a survey after today's visit either digitally by e-mail or paper by [employer]. Your experiences and feedback matter to [place].  Please respond so we know how we are doing as we provide care for you.   Wilhelmina Mcardle, DNP, AGNP-BC Adult Gerontology Nurse Practitioner [facility], [facility]

## 2018-03-25 ENCOUNTER — Ambulatory Visit (INDEPENDENT_AMBULATORY_CARE_PROVIDER_SITE_OTHER)
Admission: RE | Admit: 2018-03-25 | Discharge: 2018-03-25 | Disposition: A | Discharge status: Home / Self Care | Payer: Self-pay | Source: Ambulatory Visit | Attending: Cardiovascular Disease | Admitting: Cardiovascular Disease

## 2018-03-25 DIAGNOSIS — R079 Chest pain, unspecified: Secondary | ICD-10-CM

## 2018-03-26 LAB — BASIC METABOLIC PANEL
BUN/Creatinine Ratio: 16 (ref 12–28)
BUN: 12 mg/dL (ref 8–27)
CO2: 28 mmol/L (ref 20–29)
Calcium: 10 mg/dL (ref 8.7–10.3)
Chloride: 99 mmol/L (ref 96–106)
Creatinine, Ser: 0.73 mg/dL (ref 0.57–1.00)
GFR calc Af Amer: 101 mL/min/{1.73_m2} (ref >59)
GFR calc non Af Amer: 87 mL/min/{1.73_m2} (ref >59)
Glucose: 107 mg/dL — ABNORMAL HIGH (ref 65–99)
Potassium: 3.5 mmol/L (ref 3.5–5.2)
Sodium: 141 mmol/L (ref 134–144)

## 2018-03-26 LAB — CBC WITH DIFFERENTIAL/PLATELET
Basophils Absolute: 0.1 10*3/uL (ref 0.0–0.2)
Basos: 1 %
EOS (ABSOLUTE): 0.1 10*3/uL (ref 0.0–0.4)
Eos: 2 %
Hematocrit: 38.9 % (ref 34.0–46.6)
Hemoglobin: 13.2 g/dL (ref 11.1–15.9)
Immature Grans (Abs): 0 10*3/uL (ref 0.0–0.1)
Immature Granulocytes: 0 %
Lymphocytes Absolute: 1.9 10*3/uL (ref 0.7–3.1)
Lymphs: 32 %
MCH: 30.3 pg (ref 26.6–33.0)
MCHC: 33.9 g/dL (ref 31.5–35.7)
MCV: 89 fL (ref 79–97)
Monocytes Absolute: 0.7 10*3/uL (ref 0.1–0.9)
Monocytes: 11 %
Neutrophils Absolute: 3.3 10*3/uL (ref 1.4–7.0)
Neutrophils: 54 %
Platelets: 223 10*3/uL (ref 150–450)
RBC: 4.36 x10E6/uL (ref 3.77–5.28)
RDW: 13.3 % (ref 12.3–15.4)
WBC: 6.1 10*3/uL (ref 3.4–10.8)

## 2018-03-26 LAB — TSH+FREE T4
Free T4: 1.4 ng/dL (ref 0.82–1.77)
TSH: 1.1 u[IU]/mL (ref 0.450–4.500)

## 2018-03-28 ENCOUNTER — Telehealth: Payer: Self-pay | Admitting: Cardiovascular Disease

## 2018-03-28 ENCOUNTER — Telehealth: Payer: Self-pay | Admitting: *Deleted

## 2018-03-28 DIAGNOSIS — I7090 Unspecified atherosclerosis: Secondary | ICD-10-CM

## 2018-03-28 DIAGNOSIS — I708 Atherosclerosis of other arteries: Secondary | ICD-10-CM

## 2018-03-28 MED ORDER — ROSUVASTATIN CALCIUM 10 MG PO TABS: 10.0000 mg | ORAL_TABLET | Freq: Every day | ORAL | 6 refills | Status: DC

## 2018-03-28 NOTE — Telephone Encounter (Signed)
Call to schedule when December staff schedule available.

## 2018-03-28 NOTE — Telephone Encounter (Signed)
-----   Message from Jefferey Pica, RN sent at 03/28/2018  4:16 PM EDT ----- Please call the patient to arrange for a FASTING lipid/ liver panel to be done in about 8 weeks.  Orders placed and the patient is aware.   Thanks!

## 2018-03-28 NOTE — Telephone Encounter (Signed)
Notes recorded by Antonieta Iba, MD on 03/26/2018 at 2:07 PM EDT CT coronary calcium score moderately elevated, 430,  95th percentile based on her age This does not indicate severe stenosis, only that there is atherosclerosis noted. Would consider starting a statin to minimize progression, such as crestor 10 mg daily her LDL is 99, we need LDL 60 or less For worsening cheat pain, a stress test could be performed, If no symptoms, no further testing needed  I called and spoke with the patient.  She is aware of Dr. Windell Hummingbird recommendations to start statin therapy and is agreeable with this. I have advised her to monitor symptoms and if they worsen, then we could order a stress test to further evaluate. The patient voices understanding of the above and is agreeable.   I have advised her we will need to recheck her lipid/ liver profile in about 8 weeks. She would like to come back to the office to have this done.  She is aware I will have scheduling contact her to arrange an appointment.

## 2018-04-04 ENCOUNTER — Ambulatory Visit (INDEPENDENT_AMBULATORY_CARE_PROVIDER_SITE_OTHER): Payer: Medicare Other | Admitting: Podiatry

## 2018-04-04 ENCOUNTER — Encounter: Payer: Self-pay | Admitting: Podiatry

## 2018-04-04 VITALS — BP 157/93 | HR 77

## 2018-04-04 DIAGNOSIS — M2042 Other hammer toe(s) (acquired), left foot: Secondary | ICD-10-CM

## 2018-04-04 DIAGNOSIS — L84 Corns and callosities: Secondary | ICD-10-CM

## 2018-04-04 DIAGNOSIS — D689 Coagulation defect, unspecified: Secondary | ICD-10-CM | POA: Diagnosis not present

## 2018-04-04 NOTE — Progress Notes (Addendum)
This patient presents to the office with chief complaint of long nails and corn fourth toe left foot.  This patient  says there  is  no pain and discomfort in their feet.  This patient says there are long  Nails and she desires to discuss proper nail care..  These nails are not  painful walking and wearing shoes.  Patient has no history of infection or drainage from both feet.  Patient has corn fourth toe left foot which she treats with padding.. This patient presents  to the office today for discussion  of the  long nails and a foot evaluation .  Patient is taking eliquiss.  General Appearance  Alert, conversant and in no acute stress.  Vascular  Dorsalis pedis and posterior tibial  pulses are palpable  bilaterally.  Capillary return is within normal limits  bilaterally. Temperature is within normal limits  bilaterally.  Neurologic  Senn-Weinstein monofilament wire test within normal limits  bilaterally. Muscle power within normal limits bilaterally.  Nails Normal nails with no evidence of bacterial or fungal infection.  Orthopedic  No limitations of motion of motion feet .  No crepitus or effusions noted.  No bony pathology .  Adductovarus fifth toe hammer toe  B/L  Skin  normotropic skin with no porokeratosis noted bilaterally.  No signs of infections or ulcers noted.   Corn fourth toe left foot due to fifth toe hammet toe.   Corn secondary to curled fifth toe left foot.  IE  Debride corn fourth toe left.  A  foot exam was performed and there is no evidence of any vascular or neurologic pathology.   RTC prn   Helane Gunther DPM

## 2018-04-05 ENCOUNTER — Encounter: Payer: Self-pay | Admitting: Nurse Practitioner

## 2018-04-05 DIAGNOSIS — E039 Hypothyroidism, unspecified: Secondary | ICD-10-CM

## 2018-04-05 MED ORDER — LEVOTHYROXINE SODIUM 50 MCG PO TABS: 50.0000 ug | ORAL_TABLET | Freq: Every day | ORAL | 1 refills | Status: DC

## 2018-04-12 ENCOUNTER — Other Ambulatory Visit: Payer: Self-pay | Admitting: Family Medicine

## 2018-04-12 DIAGNOSIS — I10 Essential (primary) hypertension: Secondary | ICD-10-CM

## 2018-04-19 NOTE — Telephone Encounter (Signed)
Lmov for patient to call and schedule °

## 2018-04-19 NOTE — Telephone Encounter (Signed)
Patient scheduled 12/20 for labs

## 2018-04-29 ENCOUNTER — Telehealth: Payer: Self-pay | Admitting: Nurse Practitioner

## 2018-04-29 NOTE — Telephone Encounter (Signed)
The pt was notified by [facility]. She already scheduled for her CPAP titration on Sunday, 11/24.

## 2018-04-29 NOTE — Telephone Encounter (Signed)
Please call patient to share results of sleep study: - patient has obstructive sleep apnea.  There were 120 apneas and periods of low RR (hypopnea). There was one desaturation to 70%  - Patient will need CPAP and a titration study performed by [facility].  Please ask patient to call and schedule this if she has not already done so.[phone]

## 2018-04-29 NOTE — Telephone Encounter (Signed)
Attempted to contact the pt, no answer. lmom to return my call.  

## 2018-05-09 ENCOUNTER — Encounter: Payer: Self-pay | Admitting: Family Medicine

## 2018-05-10 ENCOUNTER — Encounter: Payer: Self-pay | Admitting: Nurse Practitioner

## 2018-05-27 ENCOUNTER — Other Ambulatory Visit (INDEPENDENT_AMBULATORY_CARE_PROVIDER_SITE_OTHER): Payer: Medicare Other

## 2018-05-27 DIAGNOSIS — I7090 Unspecified atherosclerosis: Secondary | ICD-10-CM

## 2018-05-27 DIAGNOSIS — I708 Atherosclerosis of other arteries: Secondary | ICD-10-CM

## 2018-05-28 LAB — HEPATIC FUNCTION PANEL
ALT: 15 IU/L (ref 0–32)
AST: 21 IU/L (ref 0–40)
Albumin: 4.2 g/dL (ref 3.6–4.8)
Alkaline Phosphatase: 49 IU/L (ref 39–117)
Bilirubin Total: 0.7 mg/dL (ref 0.0–1.2)
Bilirubin, Direct: 0.25 mg/dL (ref 0.00–0.40)
Total Protein: 6.8 g/dL (ref 6.0–8.5)

## 2018-05-28 LAB — LIPID PANEL
Chol/HDL Ratio: 1.8 ratio (ref 0.0–4.4)
Cholesterol, Total: 140 mg/dL (ref 100–199)
HDL: 76 mg/dL (ref >39)
LDL Calculated: 54 mg/dL (ref 0–99)
Triglycerides: 50 mg/dL (ref 0–149)
VLDL CHOLESTEROL CAL: 10 mg/dL (ref 5–40)

## 2018-06-15 ENCOUNTER — Encounter: Payer: Self-pay | Admitting: Family Medicine

## 2018-06-15 ENCOUNTER — Ambulatory Visit: Payer: Medicare Other | Admitting: Family Medicine

## 2018-06-15 VITALS — BP 112/72 | HR 81 | Temp 98.3°F | Resp 16 | Ht 67.0 in | Wt 237.0 lb

## 2018-06-15 DIAGNOSIS — N3001 Acute cystitis with hematuria: Secondary | ICD-10-CM | POA: Diagnosis not present

## 2018-06-15 LAB — POCT URINALYSIS DIPSTICK
Bilirubin, UA: NEGATIVE
Glucose, UA: NEGATIVE
Ketones, UA: NEGATIVE
Nitrite, UA: NEGATIVE
Protein, UA: NEGATIVE
Spec Grav, UA: 1.01 (ref 1.010–1.025)
Urobilinogen, UA: 0.2 E.U./dL
pH, UA: 5 (ref 5.0–8.0)

## 2018-06-15 MED ORDER — NITROFURANTOIN MONOHYD MACRO 100 MG PO CAPS: 100.0000 mg | ORAL_CAPSULE | Freq: Two times a day (BID) | ORAL | 1 refills | Status: DC

## 2018-06-15 NOTE — Progress Notes (Signed)
Subjective:    Patient ID: Meghan Cole Bostic, female    DOB: 1950-06-11, 68 y.o.   MRN: [id number]  Meghan Cole Heikkila is a 68 y.o. female presenting on 06/15/2018 for Urinary Tract Infection (onset 3 days pressure, spasm, exhaustion)  Patient presents for a same day appointment.  HPI   UTI / Acute Cystitis w/ Hematuria Reports symptoms with UTI for past 3 days, she describes symptoms of suprapubic pressure and discomfort - She has been taking D-Mannose for past few days without significant resolution - only temporary relief - In past she has had similar UTI last 01/2018 treated with macrobid with resolution, required full 7 day course. 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 In past last UA / Urine Culture was negative when we checked in 01/2018 but she still improved after treatment - Admits some bladder spasms occasionally - Admits some mild elevated HR - with known Atrial Fibrillation without chest pain, dyspnea, worsening palpitations. - Admits some low grade temp, not at level of fever Denies flank pain, gross hematuria, nausea vomiting, extending abdominal pain   [facility] 06/15/2018 01/07/2018 11/08/2017  Decreased Interest 0 0 0  Down, Depressed, Hopeless 0 0 0  PHQ - 2 Score 0 0 0  Altered sleeping - 0 0  Tired, decreased energy - 0 3  Change in appetite - 0 2  Feeling bad or failure about yourself  - 0 0  Trouble concentrating - 0 0  Moving slowly or fidgety/restless - 0 0  Suicidal thoughts - 0 0  PHQ-9 Score - 0 5  Difficult doing work/chores - Not difficult at all Not difficult at all    Social History   Tobacco Use  . Smoking status: Former Games developermoker  . Smokeless tobacco: Former Engineer, waterUser  Substance Use Topics  . Alcohol use: No  . Drug use: No    Review of Systems Per HPI unless specifically indicated above     Objective:    BP 112/72    Pulse 81   Temp 98.3 F (36.8 C) (Oral)   Resp 16   Ht 5\' 7"  (1.702 m)   Wt 237 lb (107.5 kg)   BMI 37.12 kg/m   Wt Readings from Last 3 Encounters:  06/15/18 237 lb (107.5 kg)  03/24/18 238 lb (108 kg)  03/10/18 235 lb 8 oz (106.8 kg)    Physical Exam Vitals signs and nursing note reviewed.  Constitutional:      General: She is not in acute distress.    Appearance: She is well-developed. She is not diaphoretic.     Comments: Well-appearing, comfortable, cooperative  HENT:     Head: Normocephalic and atraumatic.  Cardiovascular:     Rate and Rhythm: Normal rate.  Pulmonary:     Effort: Pulmonary effort is normal.  Abdominal:     General: There is no distension.     Comments: Suprapubic discomfort  Skin:    General: Skin is warm and dry.     Findings: No erythema or rash.  Neurological:     Mental Status: She is alert and oriented to person, place, and time.  Psychiatric:        Behavior: Behavior normal.     Comments: Well groomed, good eye contact, normal speech and thoughts    Results for orders placed or performed in visit on 06/15/18  POCT Urinalysis Dipstick  Result Value Ref Range   Color, UA yellow    Clarity, UA clear    Glucose, UA Negative Negative   Bilirubin, UA neg    Ketones, UA neg    Spec Grav, UA 1.010 1.010 - 1.025   Blood, UA moderate +2    pH, UA 5.0 5.0 - 8.0   Protein, UA Negative Negative   Urobilinogen, UA 0.2 0.2 or 1.0 E.U./dL   Nitrite, UA negative    Leukocytes, UA Trace (A) Negative   Appearance clear    Odor none       Assessment & Plan:   Problem List Items Addressed This Visit    Acute cystitis with hematuria / Urinary tract infection - Primary   Relevant Medications   nitrofurantoin, macrocrystal-monohydrate, (MACROBID) 100 MG capsule   Other Relevant Orders   Urine Culture   POCT Urinalysis Dipstick      Clinically consistent with UTI and supported by UA - also her home UA dipstick is similar Last UTI 01/2018,  unconfirmed w/ culture - resolved w/ Macrobid History of some recurrent UTI in past No concern for pyelo today (no systemic symptoms, neg fever, back pain, n/v).  Plan: 1. Urinalysis dipstick - see results, suggestive of UTI 2. Ordered Urine culture 3. Macrobid 100mg  BID x 7 days - with added refill in case recurrent episode in future, she has ability to check her own urine, advised that she should still notify office if taking antibiotic or if significant concern and follow-up - recommend culture 4. Improve PO hydration 5. RTC if no improvement 1 weeks, red flags given to return sooner   Meds ordered this encounter  Medications  . nitrofurantoin, macrocrystal-monohydrate, (MACROBID) 100 MG capsule    Sig: Take 1 capsule (100 mg total) by mouth 2 (two) times daily. For 7 days, with refill for recurrent infection    Dispense:  14 capsule    Refill:  1     Follow up plan: Return in about 1 week (around 06/22/2018), or if symptoms worsen or fail to improve, for UTI.   Saralyn Pilar, DO [facility] 06/15/2018, 1:34 PM

## 2018-06-15 NOTE — Patient Instructions (Addendum)
Thank you for coming to the office today.  1. You have a Urinary Tract Infection - this is very common, your symptoms are reassuring and you should get better within 1 week on the antibiotics  Start Macrobid 1 pill twice a day for full 7 days, even if feeling better - We sent urine for a culture, we will call you within next few days if we need to change antibiotics - Please drink plenty of fluids, improve hydration over next 1 week  If any significant worsening with urinary symptoms, fever chills, or significant urinary changes or pain notify office or seek care more immediately may need to escalate therapy.  Please schedule a Follow-up Appointment to: Return in about 1 week (around 06/22/2018), or if symptoms worsen or fail to improve, for UTI.  If you have any other questions or concerns, please feel free to call the office or send a message through MyChart. You may also schedule an earlier appointment if necessary.  Additionally, you may be receiving a survey about your experience at our office within a few days to 1 week by e-mail or mail. We value your feedback.  Saralyn Pilar, DO [facility], [place]

## 2018-06-17 DIAGNOSIS — B961 Klebsiella pneumoniae [K. pneumoniae] as the cause of diseases classified elsewhere: Secondary | ICD-10-CM

## 2018-06-17 DIAGNOSIS — N390 Urinary tract infection, site not specified: Secondary | ICD-10-CM

## 2018-06-17 DIAGNOSIS — N3001 Acute cystitis with hematuria: Secondary | ICD-10-CM

## 2018-06-17 LAB — URINE CULTURE
MICRO NUMBER:: 28118
SPECIMEN QUALITY:: ADEQUATE

## 2018-06-17 MED ORDER — CIPROFLOXACIN HCL 500 MG PO TABS: 500.0000 mg | ORAL_TABLET | Freq: Two times a day (BID) | ORAL | 0 refills | Status: AC

## 2018-08-22 ENCOUNTER — Encounter: Payer: Self-pay | Admitting: Nurse Practitioner

## 2018-08-23 NOTE — Telephone Encounter (Signed)
Please call patient to change April 14th visit to 40 min slot for medicare welcome visit.    If June 8th is Medicare visit it can be cancelled.

## 2018-09-20 ENCOUNTER — Ambulatory Visit (INDEPENDENT_AMBULATORY_CARE_PROVIDER_SITE_OTHER): Payer: Medicare Other | Admitting: Nurse Practitioner

## 2018-09-20 ENCOUNTER — Other Ambulatory Visit: Payer: Self-pay

## 2018-09-20 ENCOUNTER — Encounter: Payer: Self-pay | Admitting: Nurse Practitioner

## 2018-09-20 VITALS — BP 117/81 | HR 82 | Temp 96.9°F

## 2018-09-20 DIAGNOSIS — E039 Hypothyroidism, unspecified: Secondary | ICD-10-CM | POA: Diagnosis not present

## 2018-09-20 DIAGNOSIS — Z7901 Long term (current) use of anticoagulants: Secondary | ICD-10-CM

## 2018-09-20 DIAGNOSIS — E7800 Pure hypercholesterolemia, unspecified: Secondary | ICD-10-CM

## 2018-09-20 DIAGNOSIS — I10 Essential (primary) hypertension: Secondary | ICD-10-CM

## 2018-09-20 DIAGNOSIS — J3489 Other specified disorders of nose and nasal sinuses: Secondary | ICD-10-CM | POA: Diagnosis not present

## 2018-09-20 DIAGNOSIS — I480 Paroxysmal atrial fibrillation: Secondary | ICD-10-CM | POA: Diagnosis not present

## 2018-09-20 DIAGNOSIS — N952 Postmenopausal atrophic vaginitis: Secondary | ICD-10-CM

## 2018-09-20 MED ORDER — LEVOTHYROXINE SODIUM 50 MCG PO TABS: 50.0000 ug | ORAL_TABLET | Freq: Every day | ORAL | 1 refills | Status: DC

## 2018-09-20 MED ORDER — ROSUVASTATIN CALCIUM 10 MG PO TABS: 10.0000 mg | ORAL_TABLET | Freq: Every day | ORAL | 1 refills | Status: DC

## 2018-09-20 MED ORDER — ESTROGENS, CONJUGATED 0.625 MG/GM VA CREA: TOPICAL_CREAM | VAGINAL | 12 refills | Status: DC

## 2018-09-20 MED ORDER — MUPIROCIN 2 % EX OINT: 1.0000 "application " | TOPICAL_OINTMENT | Freq: Two times a day (BID) | CUTANEOUS | 0 refills | Status: AC

## 2018-09-20 MED ORDER — HYDROCHLOROTHIAZIDE 12.5 MG PO CAPS: 12.5000 mg | ORAL_CAPSULE | Freq: Every day | ORAL | 3 refills | Status: DC

## 2018-09-20 NOTE — Progress Notes (Signed)
Telemedicine Encounter: Disclosed to patient at start of encounter that we will provide appropriate telemedicine services.  Patient consents to be treated via telemedicine with synchronous video/audio using [email or web address] prior to discussion. - Patient is at her home and is accessed via [email or web address] - Services are provided by Wilhelmina Mcardle from [facility].  Subjective:    Patient ID: Meghan Cole, female    DOB: May 07, 1951, 68 y.o.   MRN: [id number]  Meghan Cole is a 68 y.o. female presenting on 09/20/2018 for Hypertension and Hypothyroidism  Patient has a small concern about her nose with small blisters with white discharge in nose. Neosporin has not helped. Patient uses CPAP and cleans her machine weekly, nose piece daily.  HPI Hypertension/Atrial fibrillation - She is checking BP at home or outside of clinic.  Readings 117/81, usually 120/60s  HR: 60s to 70-80 during day.  - Current medications: metoprolol succinate 25 mg once daily, HCTZ 12.5 mg once daily, tolerating well without side effects  - Patient has not taken her diltiazem recently and she "has not needed it" - She is taking Eliquis 5 mg daily for anticoag and has had not signs of bleeding regularly.  Occasionally (1-2 times per month has epistaxis Right nare).  - She is not currently symptomatic. - Pt denies headache, lightheadedness, dizziness, changes in vision, chest tightness/pressure, palpitations, leg swelling, sudden loss of speech or loss of consciousness. - She  reports an exercise routine that includes walking, yard/house work, 3-4 days per week. - Her diet is moderate in salt, moderate in fat, and moderate in carbohydrates.   Hypothyroidism - Pt states she is taking her levothyroxine 50 mcg in the am at least 1 hour before eating or drinking and taking other medicines.  She reports stable dose for several years.  - She is not currently symptomatic. - She denies, fatigue, excess energy, weight changes, heart  racing, heart palpitations, heat and cold intolerance, changes in hair/skin/nails, and lower leg swelling.  - She does not have any compressive symptoms to include difficulty swallowing, globus sensation, or difficulty breathing when lying flat.   Hyperlipidemia Patient is currently taking rosuvastatin 10 mg once daily without side effects. - Pt denies changes in vision, chest tightness/pressure, palpitations, shortness of breath, leg pain while walking, leg or arm weakness, and sudden loss of speech or loss of consciousness.  - Patient takes for pure hypercholesterolemia with LDL goal <70 as primary prevention of ASCVD events.    Social History   Tobacco Use  . Smoking status: Former Games developer  . Smokeless tobacco: Former Engineer, water Use Topics  . Alcohol use: No  . Drug use: No    Review of Systems Per HPI unless specifically indicated above     Objective:    There were no vitals taken for this visit.  Wt Readings from Last 3 Encounters:  06/15/18 237 lb (107.5 kg)  03/24/18 238 lb (108 kg)  03/10/18 235 lb 8 oz (106.8 kg)    Physical Exam Patient remotely monitored.  Physical appearance normal on video.  Verbal communication appropriate.  Cognition normal. Alert and oriented x4.     Assessment & Plan:   Problem List Items Addressed This Visit      Cardiovascular and Mediastinum   Essential hypertension - Primary Controlled hypertension per patient reports today.  BP goal < 130/80.  Pt is continuing to work on lifestyle modifications.  Taking medications tolerating well without side effects. Complicated by OSA, morbid obesity.  Plan: 1. Continue taking medications without changes 2. Obtain labs 2 months  3. Encouraged heart healthy diet and increasing exercise to 30 minutes most days of the week. 4. Check BP 1-2 x per week at home, keep log, and bring to clinic at next appointment. 5. Follow up 6 months.     Relevant Medications   hydrochlorothiazide (MICROZIDE)  12.5 MG capsule   rosuvastatin (CRESTOR) 10 MG tablet   Other Relevant Orders   COMPLETE METABOLIC PANEL WITH GFR   Paroxysmal atrial fibrillation (HCC) Stable HR today per patient report.  Continue current medications, continue regular follow-up with Dr. Mariah MillingGollan.  Follow-up prn and annually with primary care.   Relevant Medications   hydrochlorothiazide (MICROZIDE) 12.5 MG capsule   rosuvastatin (CRESTOR) 10 MG tablet   Other Relevant Orders   CBC with Differential/Platelet     Endocrine   Adult hypothyroidism Stable today on exam.  Medications tolerated without side effects.  Continue at current doses.  Refills provided.  Check labs in about 2 months after Covid-19 precautions are lifted. Followup 6 months.    Relevant Medications   levothyroxine (SYNTHROID, LEVOTHROID) 50 MCG tablet   Other Relevant Orders   Lipid panel   TSH    Other Visit Diagnoses    Nasal lesion     Patient with new nasal lesions consistent with mild ulcerations.  Complicated by CPAP use.   - START mupirocin ointment bid to bilateral nares x 5 days.  Then stop all ointments. - May use nasal saline spray only if needed for moisturization after this. - Follow-up prn if not improving.   Relevant Medications   mupirocin ointment (BACTROBAN) 2 %   Vaginal atrophy     Stable on premarin cream.  Refill requested and provided.   Relevant Medications   conjugated estrogens (PREMARIN) vaginal cream   Anticoagulated     Patient continues on Eliquis anticoag for paroxysmal a fib.  Primarily managed by Dr. Mariah MillingGollan.  Patient has occasional nose bleeds on Eliquis, but are usually manageable.  Patient continues to use Afrin at start of nose bleed.  No other bleeding symptoms.  CBC due to be collected in about 2 months for monitoring.  Follow-up with Dr. Mariah MillingGollan as requested in about 6 months from now.   Relevant Orders   CBC with Differential/Platelet   Pure hypercholesterolemia     Last lipid with LDL at goal <70.  Patient  continues to tolerate rosuvastatin daily without side effects.  Continue for primary prevention of ASCVD events.  Follow-up with labs in about 2 months after Covid-19 precautions are lifted.   Relevant Medications   hydrochlorothiazide (MICROZIDE) 12.5 MG capsule   rosuvastatin (CRESTOR) 10 MG tablet   Other Relevant Orders   Lipid panel      Meds ordered this encounter  Medications  . mupirocin ointment (BACTROBAN) 2 %    Sig: Place 1 application into the nose 2 (two) times daily for 5 days.    Dispense:  22 g    Refill:  0    Order Specific Question:   Supervising Provider    Answer:   Smitty CordsKARAMALEGOS, ALEXANDER J [[id number]]  . conjugated estrogens (PREMARIN) vaginal cream    Sig: Place fingertip amount at opening of vagina every night.    Dispense:  42.5 g    Refill:  12    Order Specific Question:   Supervising Provider    Answer:   Smitty CordsKARAMALEGOS, ALEXANDER J [[id number]]  . hydrochlorothiazide (MICROZIDE) 12.5 MG  capsule    Sig: Take 1 capsule (12.5 mg total) by mouth daily.    Dispense:  90 capsule    Refill:  3    Order Specific Question:   Supervising Provider    Answer:   Smitty Cords [[id number]]  . levothyroxine (SYNTHROID, LEVOTHROID) 50 MCG tablet    Sig: Take 1 tablet (50 mcg total) by mouth daily before breakfast.    Dispense:  90 tablet    Refill:  1    Order Specific Question:   Supervising Provider    Answer:   Smitty Cords [[id number]]  . rosuvastatin (CRESTOR) 10 MG tablet    Sig: Take 1 tablet (10 mg total) by mouth daily.    Dispense:  90 tablet    Refill:  1    Order Specific Question:   Supervising Provider    Answer:   Smitty Cords [[id number]]   - Time spent in direct consultation with patient via telemedicine about above concerns: 9 minutes  Follow up plan: Return in about 6 months (around 03/22/2019) for hypertension, thyroid AND in 2 mos for labs.  Wilhelmina Mcardle, DNP, AGPCNP-BC Adult Gerontology Primary Care Nurse Practitioner [facility] 09/20/2018, 8:04 AM

## 2018-09-23 ENCOUNTER — Encounter: Payer: Self-pay | Admitting: Nurse Practitioner

## 2018-11-11 ENCOUNTER — Telehealth: Payer: Self-pay

## 2018-11-11 DIAGNOSIS — I10 Essential (primary) hypertension: Secondary | ICD-10-CM

## 2018-11-11 DIAGNOSIS — E7800 Pure hypercholesterolemia, unspecified: Secondary | ICD-10-CM

## 2018-11-11 DIAGNOSIS — E039 Hypothyroidism, unspecified: Secondary | ICD-10-CM

## 2018-11-11 DIAGNOSIS — Z7901 Long term (current) use of anticoagulants: Secondary | ICD-10-CM

## 2018-11-11 DIAGNOSIS — I480 Paroxysmal atrial fibrillation: Secondary | ICD-10-CM

## 2018-11-11 NOTE — Addendum Note (Signed)
Addended by: Kaveri Perras L on: 11/11/2018 08:36 AM   Modules accepted: Orders  

## 2018-11-11 NOTE — Addendum Note (Signed)
Addended by: Kayn Haymore L on: 11/11/2018 08:36 AM   Modules accepted: Orders  

## 2018-11-11 NOTE — Telephone Encounter (Signed)
Labs released and order mailed out to the patient.

## 2018-11-11 NOTE — Addendum Note (Signed)
Addended by: Lonna Cobb on: 11/11/2018 08:36 AM   Modules accepted: Orders

## 2018-11-14 ENCOUNTER — Ambulatory Visit (INDEPENDENT_AMBULATORY_CARE_PROVIDER_SITE_OTHER): Payer: Medicare Other | Admitting: Nurse Practitioner

## 2018-11-22 LAB — COMPREHENSIVE METABOLIC PANEL
ALT: 20 IU/L (ref 0–32)
AST: 27 IU/L (ref 0–40)
Albumin/Globulin Ratio: 1.7 (ref 1.2–2.2)
Albumin: 4.3 g/dL (ref 3.8–4.8)
Alkaline Phosphatase: 54 IU/L (ref 39–117)
BUN/Creatinine Ratio: 14 (ref 12–28)
BUN: 11 mg/dL (ref 8–27)
Bilirubin Total: 0.9 mg/dL (ref 0.0–1.2)
CO2: 23 mmol/L (ref 20–29)
Calcium: 9.6 mg/dL (ref 8.7–10.3)
Chloride: 99 mmol/L (ref 96–106)
Creatinine, Ser: 0.77 mg/dL (ref 0.57–1.00)
GFR calc Af Amer: 94 mL/min/{1.73_m2} (ref >59)
GFR calc non Af Amer: 81 mL/min/{1.73_m2} (ref >59)
Globulin, Total: 2.5 g/dL (ref 1.5–4.5)
Glucose: 86 mg/dL (ref 65–99)
Potassium: 3.8 mmol/L (ref 3.5–5.2)
Sodium: 140 mmol/L (ref 134–144)
Total Protein: 6.8 g/dL (ref 6.0–8.5)

## 2018-11-22 LAB — CBC WITH DIFFERENTIAL/PLATELET
Basophils Absolute: 0.1 10*3/uL (ref 0.0–0.2)
Basos: 1 %
EOS (ABSOLUTE): 0.2 10*3/uL (ref 0.0–0.4)
Eos: 3 %
Hematocrit: 38.5 % (ref 34.0–46.6)
Hemoglobin: 12.8 g/dL (ref 11.1–15.9)
Immature Grans (Abs): 0 10*3/uL (ref 0.0–0.1)
Immature Granulocytes: 0 %
Lymphocytes Absolute: 1.8 10*3/uL (ref 0.7–3.1)
Lymphs: 31 %
MCH: 29.3 pg (ref 26.6–33.0)
MCHC: 33.2 g/dL (ref 31.5–35.7)
MCV: 88 fL (ref 79–97)
Monocytes Absolute: 0.8 10*3/uL (ref 0.1–0.9)
Monocytes: 13 %
Neutrophils Absolute: 3.1 10*3/uL (ref 1.4–7.0)
Neutrophils: 52 %
Platelets: 187 10*3/uL (ref 150–450)
RBC: 4.37 x10E6/uL (ref 3.77–5.28)
RDW: 13.7 % (ref 11.7–15.4)
WBC: 5.9 10*3/uL (ref 3.4–10.8)

## 2018-11-22 LAB — LIPID PANEL
Chol/HDL Ratio: 1.8 ratio (ref 0.0–4.4)
Cholesterol, Total: 139 mg/dL (ref 100–199)
HDL: 76 mg/dL (ref >39)
LDL Calculated: 49 mg/dL (ref 0–99)
Triglycerides: 68 mg/dL (ref 0–149)
VLDL Cholesterol Cal: 14 mg/dL (ref 5–40)

## 2018-11-22 LAB — TSH: TSH: 1.61 u[IU]/mL (ref 0.450–4.500)

## 2019-02-15 ENCOUNTER — Encounter: Payer: Self-pay | Admitting: Nurse Practitioner

## 2019-02-15 ENCOUNTER — Other Ambulatory Visit: Payer: Self-pay

## 2019-02-15 ENCOUNTER — Ambulatory Visit (INDEPENDENT_AMBULATORY_CARE_PROVIDER_SITE_OTHER): Payer: Medicare Other | Admitting: Nurse Practitioner

## 2019-02-15 VITALS — BP 122/78 | HR 60 | Ht 67.0 in | Wt 241.6 lb

## 2019-02-15 DIAGNOSIS — Z1211 Encounter for screening for malignant neoplasm of colon: Secondary | ICD-10-CM | POA: Diagnosis not present

## 2019-02-15 DIAGNOSIS — Z0000 Encounter for general adult medical examination without abnormal findings: Secondary | ICD-10-CM | POA: Diagnosis not present

## 2019-02-15 DIAGNOSIS — Z23 Encounter for immunization: Secondary | ICD-10-CM

## 2019-02-15 NOTE — Patient Instructions (Addendum)
Ms. Harney , Thank you for taking time to come for your Medicare Wellness Visit. I appreciate your ongoing commitment to your health goals. Please review the following plan we discussed and let me know if I can assist you in the future.   These are the goals we discussed: Goals    . DIET - INCREASE WATER INTAKE     Drink at least 72 oz water daily.    Marland Kitchen DIET - REDUCE SALT INTAKE TO 2 GRAMS PER DAY OR LESS     Continue lower salt diet, reduce to 2000 mg if possible.       This is a list of the screening recommended for you and due dates:  Health Maintenance  Topic Date Due  . Colon Cancer Screening  01/06/2014  . Mammogram  12/10/2017  . DEXA scan (bone density measurement)  04/04/2018  . Pneumonia vaccines (1 of 2 - PCV13) 04/04/2018  . Flu Shot  01/07/2019  . Tetanus Vaccine  06/12/2020  . Pap Smear  11/08/2020  .  Hepatitis C: One time screening is recommended by Center for Disease Control  (CDC) for  adults born from [birth year] through 1963.   Completed  . HIV Screening  Completed

## 2019-02-15 NOTE — Progress Notes (Signed)
Subjective:    Meghan Cole is a 68 y.o. female who presents for Medicare Initial preventive examination.  Preventive Screening-Counseling & Management Problems Prior to Visit (verified) Patient Active Problem List   Diagnosis Date Noted  . Abnormal EKG 03/11/2018  . Hepatitis C virus infection without hepatic coma 03/11/2018  . OSA (obstructive sleep apnea) 11/25/2017  . Paroxysmal atrial fibrillation (HCC) 12/01/2016  . 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 Elevated liver enzymes 12/23/2015  . History of prolonged Q-T interval on ECG 11/25/2015  . Adult hypothyroidism 11/25/2015  . Class 2 severe obesity with body mass index (BMI) of 35 to 39.9 with serious comorbidity (HCC) 11/25/2015  . Episode of syncope 11/25/2015  . Essential hypertension 11/25/2015   Medications Prior to Visit (verified) Current Outpatient Medications on File Prior to Visit  Medication Sig Dispense Refill  . apixaban (ELIQUIS) 5 MG TABS tablet Take 1 tablet (5 mg total) by mouth 2 (two) times daily. 180 tablet 3  . cholecalciferol (VITAMIN D) 1000 units tablet Take 1 tablet (1,000 Units total) by mouth daily.    Marland Kitchen. conjugated estrogens (PREMARIN) vaginal cream Place fingertip amount at opening of vagina every night. 42.5 g 12  . hydrochlorothiazide (MICROZIDE) 12.5 MG capsule Take 1 capsule (12.5 mg total) by mouth daily. 90 capsule 3  . levothyroxine (SYNTHROID, LEVOTHROID) 50 MCG tablet Take 1 tablet (50 mcg total) by mouth daily before breakfast. 90 tablet 1  . metoprolol succinate (TOPROL-XL) 25 MG 24 hr tablet Take 1 tablet (25 mg total) by mouth 2 (two) times daily. Take with or immediately following a meal. 180 tablet 3  . diltiazem (CARDIZEM) 30 MG tablet Take 1 tablet (30 mg total) by mouth at bedtime as needed. (Patient not taking: Reported on 02/15/2019) 90 tablet 2  . rosuvastatin (CRESTOR) 10 MG tablet Take 1 tablet (10 mg total) by mouth daily. 90 tablet 1   No current  facility-administered medications on file prior to visit.    Allergies (verified) Patient has no known allergies.  PAST HISTORY Family History Family History  Problem Relation Age of Onset  . Cancer Mother        melanoma  . Heart disease Father   . Heart attack Father   . Breast cancer Paternal Grandmother [id number]   Social History Social History   Tobacco Use  . Smoking status: Former Games developermoker  . Smokeless tobacco: Former Engineer, waterUser  Substance Use Topics  . Alcohol use: No    Are there smokers in your home (other than you)? No  Risk Factors Current exercise habits: Home exercise routine includes walking 30 mins per day, 6 days per week Dietary issues discussed: Regular, limits salt   Hospitalizations in last 1 year: none  Cardiac risk factors: advanced age (older than 3655 for men, 2465 for women), hypertension and obesity (BMI >= 30 kg/m2).  Depression Screen [facility] screen [facility] 02/15/2019 06/15/2018 01/07/2018 11/08/2017 09/16/2016  Decreased Interest 0 0 0 0 0  Down, Depressed, Hopeless 0 0 0 0 0  PHQ - 2 Score 0 0 0 0 0  Altered sleeping - - 0 0 -  Tired, decreased energy - - 0 3 -  Change in appetite - - 0 2 -  Feeling bad or failure about yourself  - - 0 0 -  Trouble concentrating - - 0 0 -  Moving slowly or fidgety/restless - - 0 0 -  Suicidal thoughts - - 0 0 -  PHQ-9 Score - -  0 5 -  Difficult doing work/chores - - Not difficult at all Not difficult at all -     Activities of Daily Living In your present state of health, do you have any difficulty performing the following activities?:  Driving? No Managing money?  No Feeding yourself? No Getting from bed to chair? No Climbing a flight of stairs? No Preparing food and eating?: No Bathing or showering? No Getting dressed: No Getting to the toilet? No Using the toilet:No Moving around from place to place: No In the past year have you fallen or had a near fall?:No   Are you sexually active?  Yes  Do you have more  than one partner?  No  Hearing Difficulties: No Do you often ask people to speak up or repeat themselves? No Do you experience ringing or noises in your ears? No Do you have difficulty understanding soft or whispered voices? No   Do you feel that you have a problem with memory? No  Do you often misplace items? No  Do you feel safe at home?  Yes  Cognitive Testing  Alert? Yes  Normal Appearance?Yes  Oriented to person? Yes  Place? Yes   Time? Yes  Displays appropriate judgment?Yes   6CIT Screen 02/15/2019  What Year? 0 points  What month? 0 points  What time? 0 points  Count back from 20 0 points  Months in reverse 0 points  Repeat phrase 0 points  Total Score 0    Advanced Directives  Advanced Directives have been discussed with the patient? Yes    List the Names of Other Physician/Practitioners you currently use: 1. Dermatology: Dr. Aubery Lapping 2. Dr Rockey Situ  Medical Services received from non-[facility] providers in the past year: - Dermatology  Immunization History  Administered Date(s) Administered  . Fluad Quad(high Dose 65+) 02/15/2019  . Influenza,inj,Quad PF,6+ Mos 02/18/2018  . Influenza-Unspecified 02/18/2018  . Pneumococcal Conjugate-13 02/15/2019  . Zoster Recombinat (Shingrix) 10/23/2016, 12/24/2016    Screening Tests Health Maintenance  Topic Date Due  . COLONOSCOPY  01/06/2014  . MAMMOGRAM  12/10/2017  . DEXA SCAN  04/04/2018  . PNA vac Low Risk Adult (2 of 2 - PPSV23) 02/15/2020  . TETANUS/TDAP  06/12/2020  . PAP SMEAR-Modifier  11/08/2020  . INFLUENZA VACCINE  Completed  . Hepatitis C Screening  Completed  . HIV Screening  Completed    Dexa possibly done at [facility], declines due to getting self exam, perceived lack of utility of mammogram History reviewed: allergies, current medications, past family history, past medical history, past social history, past surgical history and problem list  Review of Systems Review of Systems   Constitutional: Negative for chills, diaphoresis, fever, malaise/fatigue and weight loss.  HENT: Positive for hearing loss. Negative for congestion, sinus pain, sore throat and tinnitus.   Eyes: Negative for blurred vision, double vision, pain, discharge and redness.  Respiratory: Negative for cough and shortness of breath.   Cardiovascular: Positive for palpitations. Negative for chest pain, claudication and leg swelling.  Gastrointestinal: Negative for abdominal pain, blood in stool, constipation, diarrhea, heartburn, nausea and vomiting.  Genitourinary: Negative for dysuria, flank pain, frequency, hematuria and urgency.  Musculoskeletal: Positive for joint pain.  Skin: Negative for itching and rash.  Neurological: Positive for headaches (mild, resolves w ibuprofen).  Psychiatric/Behavioral: Negative for depression, hallucinations, memory loss, substance abuse and suicidal ideas. The patient is not nervous/anxious and does not have insomnia (wears CPAP nightly).  Objective:    BP 122/78 (BP Location: Right Arm, Patient Position: Sitting, Cuff Size: Normal)   Pulse 60   Ht 5\' 7"  (1.702 m)   Wt 241 lb 9.6 oz (109.6 kg)   SpO2 100%   BMI 37.84 kg/m    Hearing Screening   125Hz  250Hz  500Hz  1000Hz  2000Hz  3000Hz  4000Hz  6000Hz  8000Hz   Right ear:  Pass passed Fail Pass  failed    Left ear:  Pass Pass Pass Pass  Pass      Visual Acuity Screening   Right eye Left eye Both eyes  Without correction:     With correction: 20/20 20/20 20/15     Filed Weights   02/15/19 1044  Weight: 241 lb 9.6 oz (109.6 kg)    Physical Exam Vitals signs reviewed.  Constitutional:      General: She is not in acute distress.    Appearance: She is well-developed.  HENT:     Head: Normocephalic and atraumatic.  Skin:    General: Skin is warm and dry.  Neurological:     Mental Status: She is alert and oriented to person, place, and time.  Psychiatric:        Behavior: Behavior normal.      02/17/19 EKG: Atrial Fibrillation  HR 74     Assessment and Plan:    Problem List Items Addressed This Visit    None    Visit Diagnoses    Encounter for Medicare annual wellness exam    -  Primary   Need for immunization against influenza       Relevant Orders   Flu Vaccine QUAD High Dose(Fluad) (Completed)   Colon cancer screening       Relevant Orders   Cologuard   Need for Streptococcus pneumoniae vaccination       Relevant Orders   Pneumococcal conjugate vaccine 13-valent (Completed)      During the course of the visit the patient was educated and counseled about appropriate screening and preventive services including:    Pneumococcal vaccine   Influenza vaccine  Screening electrocardiogram  Nutrition counseling   Smoking cessation counseling  Advanced directives: has an advanced directive - a copy HAS NOT been provided.  Diet review for nutrition referral? n/a  Patient Instructions (the written plan) was given to the patient.  Medicare Attestation I have personally reviewed: The patient's medical and social history Their use of alcohol, tobacco or illicit drugs Their current medications and supplements The patient's functional ability including ADLs,fall risks, home safety risks, cognitive, and hearing and visual impairment Diet and physical activities Evidence for depression or mood disorders  The patient's weight, height, BMI, and visual acuity have been recorded in the chart.  I have made referrals, counseling, and provided education to the patient based on review of the above and I have provided the patient with a written personalized care plan for preventive services.     Wilhelmina Mcardle, DNP, AGPCNP-BC Adult Gerontology Primary Care Nurse Practitioner [facility] 02/15/2019, 11:09 AM

## 2019-02-17 ENCOUNTER — Encounter: Payer: Self-pay | Admitting: Nurse Practitioner

## 2019-02-25 ENCOUNTER — Encounter: Payer: Self-pay | Admitting: Nurse Practitioner

## 2019-02-27 MED ORDER — METFORMIN HCL 500 MG PO TABS: 500.0000 mg | ORAL_TABLET | Freq: Two times a day (BID) | ORAL | 1 refills | Status: DC

## 2019-02-28 LAB — COLOGUARD: Cologuard: NEGATIVE

## 2019-03-07 ENCOUNTER — Encounter: Payer: Self-pay | Admitting: Nurse Practitioner

## 2019-03-18 ENCOUNTER — Other Ambulatory Visit: Payer: Self-pay | Admitting: Nurse Practitioner

## 2019-03-18 ENCOUNTER — Other Ambulatory Visit: Payer: Self-pay | Admitting: Cardiovascular Disease

## 2019-03-18 DIAGNOSIS — E7800 Pure hypercholesterolemia, unspecified: Secondary | ICD-10-CM

## 2019-03-18 DIAGNOSIS — E039 Hypothyroidism, unspecified: Secondary | ICD-10-CM

## 2019-03-19 NOTE — Progress Notes (Signed)
Cardiology Office Note  Date:  03/21/2019   ID:  Meghan Cole, DOB December 18, 1950, MRN [id number]  PCP:  Galen ManilaKennedy, Lauren Renee, NP   Chief Complaint  Patient presents with  . Other    12 month folow up. Patient denies chest pain and SOB at this time. Meds reviewed verbally with patient.     HPI:  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 Coronary calcium score of 430. This was 95 percentile for age and sex matched control. 2.  An occluder device is seen in the interatrial septum.  Echocardiogram October 2019 Left ventricle: The cavity size was normal. Wall thickness was   normal. Systolic function was normal. The estimated ejection   fraction was in the range of 55% to 60%. Wall motion was normal;   there were no regional wall motion abnormalities. Left   ventricular diastolic function parameters were normal. - Left atrium: The atrium was mildly dilated.   husband with bypass surgery and valve disease  Lab work reviewed with her in detail TSH 1.3 Hemoglobin A1c 5.4 Total cholesterol 139, LDL 49 Normal BMP and LFTs  EKG personally reviewed by myself on todays visit Shows atrial fibrillation rate 87 bpm nonspecific T wave abnormality anterolateral leads  Seen in the office 09/2017 Propafenone increased up to 150 TID  With metoprolol succinate 25 BID DCCV Oct 13, 2017  lasted only 6 days  [facility]  Discussed other options for rhythm control including dofetilide loading vs ablation. Compliant with anticoagulation/eliquis  Other past medical history reviewed Did not tolerate flecainide, some nausea  Previous event monitor  Documenting atrial fibrillation Several episodes of atrial fibrillation both on May 19, first episode appeared regular and rapid, second episode or irregular consistent with atrial fibrillation Atrial fib last  Sat, 10/24/2016 10:45 Am, rate 154, up to 174 on tele,  Back to NSR 12:45 Had episode at 5 PM  Father with CAD, MI,  atrial fib  PMH:   has a past medical history of Anxiety (11/25/2015), Atrial septal defect, Chest pain (03/11/2018), Clinical depression (11/25/2015), 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  PSH:    Past Surgical History:  Procedure Laterality Date  . ATRIAL SEPTAL DEFECT(ASD) CLOSURE    . CARDIOVERSION N/A 10/13/2017   Procedure: CARDIOVERSION;  Surgeon: Antonieta IbaGollan,  J, MD;  Location: [facility] ORS;  Service: Cardiovascular;  Laterality: N/A;  . COLONOSCOPY WITH PROPOFOL     2011  . EXPLORATION POST OPERATIVE OPEN HEART      Current Outpatient Medications  Medication Sig Dispense Refill  . apixaban (ELIQUIS) 5 MG TABS tablet Take 1 tablet (5 mg total) by mouth 2 (two) times daily. 60 tablet 0  . cholecalciferol (VITAMIN D) 1000 units tablet Take 1 tablet (1,000 Units total) by mouth daily.    Marland Kitchen. conjugated estrogens (PREMARIN) vaginal cream Place fingertip amount at opening of vagina every night. 42.5 g 12  .  diltiazem (CARDIZEM) 30 MG tablet Take 1 tablet (30 mg total) by mouth at bedtime as needed. 90 tablet 2  . hydrochlorothiazide (MICROZIDE) 12.5 MG capsule Take 1 capsule (12.5 mg total) by mouth daily. 90 capsule 3  . levothyroxine (SYNTHROID) 50 MCG tablet TAKE 1 TABLET BY MOUTH BEFORE BREAKFAST 90 tablet 1  . metFORMIN (GLUCOPHAGE) 500 MG tablet Take 500 mg by mouth daily with breakfast.    .  metoprolol succinate (TOPROL-XL) 25 MG 24 hr tablet Take 1 tablet (25 mg total) by mouth 2 (two) times daily. Take with or immediately following a meal. 180 tablet 3  . rosuvastatin (CRESTOR) 10 MG tablet TAKE 1 TABLET(10 MG) BY MOUTH DAILY 90 tablet 1   No current facility-administered medications for this visit.      Allergies:   Patient has no known allergies.   Social History:  The patient  reports that she has quit smoking. She has quit using smokeless tobacco. She reports that she does not drink alcohol or use drugs.   Family History:   family history includes Breast cancer (age of onset: 51) in her paternal grandmother; Cancer in her mother; Heart attack in her father; Heart disease in her father.   Review of Systems: Review of Systems  Constitutional: Negative.   Respiratory: Negative.   Cardiovascular: Positive for chest pain and palpitations.       Tachycardia  Gastrointestinal: Negative.   Musculoskeletal: Negative.   Neurological: Negative.   Psychiatric/Behavioral: Negative.   All other systems reviewed and are negative.   PHYSICAL EXAM: VS:  BP 120/80 (BP Location: Left Arm, Patient Position: Sitting, Cuff Size: Normal)   Pulse 87   Ht 5\' 7"  (1.702 m)   Wt 241 lb 8 oz (109.5 kg)   BMI 37.82 kg/m  , BMI Body mass index is 37.82 kg/m. Constitutional:  oriented to person, place, and time. No distress.  HENT:  Head: Grossly normal Eyes:  no discharge. No scleral icterus.  Neck: No JVD, no carotid bruits  Cardiovascular: Regular rate and rhythm, no murmurs appreciated Pulmonary/Chest: Clear to auscultation bilaterally, no wheezes or rails Abdominal: Soft.  no distension.  no tenderness.  Musculoskeletal: Normal range of motion Neurological:  normal muscle tone. Coordination normal. No atrophy Skin: Skin warm and dry Psychiatric: normal affect, pleasant  Recent Labs: 11/21/2018: ALT 20; BUN 11; Creatinine, Ser 0.77; Hemoglobin 12.8; Platelets 187; Potassium 3.8;  Sodium 140; TSH 1.610    Lipid Panel Lab Results  Component Value Date   CHOL 139 11/21/2018   HDL 76 11/21/2018   LDLCALC 49 11/21/2018   TRIG 68 11/21/2018      Wt Readings from Last 3 Encounters:  03/21/19 241 lb 8 oz (109.5 kg)  02/15/19 241 lb 9.6 oz (109.6 kg)  06/15/18 237 lb (107.5 kg)     ASSESSMENT AND PLAN:   Persistent atrial fibrillation Dating back to 2017  Eliquis 5 twice daily  on metoprolol Asymptomatic, no medication changes made  Chest pain Rare episodes, atypical Calcium score in the 400s Prior smoking history 30 years but stopped 20 years ago Continue aggressive lipid management  Tachycardia Not an active issue on today's visit  Essential hypertension - Blood pressure is well controlled on today's visit. No changes made to the medications. Stable  Morbid obesity Weight is steady, restarted Metformin for weight loss Feels it is going to work  Disposition:   F/U  12 month     Total encounter time more than 25 minutes  Greater than 50% was spent in counseling and coordination of care with the patient    No orders of the defined types were placed in this encounter.    Signed, Esmond Plants, M.D., Ph.D. 03/21/2019  [place], [place]

## 2019-03-20 NOTE — Telephone Encounter (Signed)
Refill Request.  

## 2019-03-20 NOTE — Telephone Encounter (Signed)
Pt's age 68, wt 109.6 kg, SCr 0.77, CrCl 126.03, has appt w/ TG tomorrow, 03/21/19.

## 2019-03-21 ENCOUNTER — Ambulatory Visit (INDEPENDENT_AMBULATORY_CARE_PROVIDER_SITE_OTHER): Payer: Medicare Other | Admitting: Cardiovascular Disease

## 2019-03-21 ENCOUNTER — Other Ambulatory Visit: Payer: Self-pay

## 2019-03-21 ENCOUNTER — Encounter: Payer: Self-pay | Admitting: Cardiovascular Disease

## 2019-03-21 VITALS — BP 120/80 | HR 87 | Ht 67.0 in | Wt 241.5 lb

## 2019-03-21 DIAGNOSIS — R079 Chest pain, unspecified: Secondary | ICD-10-CM

## 2019-03-21 DIAGNOSIS — I708 Atherosclerosis of other arteries: Secondary | ICD-10-CM | POA: Diagnosis not present

## 2019-03-21 DIAGNOSIS — B1920 Unspecified viral hepatitis C without hepatic coma: Secondary | ICD-10-CM

## 2019-03-21 DIAGNOSIS — I4819 Other persistent atrial fibrillation: Secondary | ICD-10-CM | POA: Diagnosis not present

## 2019-03-21 DIAGNOSIS — R55 Syncope and collapse: Secondary | ICD-10-CM

## 2019-03-21 DIAGNOSIS — I7090 Unspecified atherosclerosis: Secondary | ICD-10-CM

## 2019-03-21 DIAGNOSIS — I10 Essential (primary) hypertension: Secondary | ICD-10-CM | POA: Diagnosis not present

## 2019-03-21 MED ORDER — APIXABAN 5 MG PO TABS: 5.0000 mg | ORAL_TABLET | Freq: Two times a day (BID) | ORAL | 3 refills | Status: DC

## 2019-03-21 NOTE — Patient Instructions (Signed)

## 2019-04-20 ENCOUNTER — Telehealth: Payer: Self-pay

## 2019-04-20 MED ORDER — METOPROLOL SUCCINATE ER 25 MG PO TB24: 25.0000 mg | ORAL_TABLET | Freq: Two times a day (BID) | ORAL | 3 refills | Status: DC

## 2019-04-20 NOTE — Telephone Encounter (Signed)
Requested Prescriptions   Signed Prescriptions Disp Refills  . metoprolol succinate (TOPROL-XL) 25 MG 24 hr tablet 180 tablet 3    Sig: Take 1 tablet (25 mg total) by mouth 2 (two) times daily. Take with or immediately following a meal.    Authorizing Provider: Minna Merritts    Ordering User: Raelene Bott, Trinidad Ingle L

## 2019-09-07 ENCOUNTER — Other Ambulatory Visit: Payer: Self-pay | Admitting: Nurse Practitioner

## 2019-09-07 DIAGNOSIS — E7800 Pure hypercholesterolemia, unspecified: Secondary | ICD-10-CM

## 2019-09-07 DIAGNOSIS — E039 Hypothyroidism, unspecified: Secondary | ICD-10-CM

## 2019-09-07 DIAGNOSIS — I10 Essential (primary) hypertension: Secondary | ICD-10-CM

## 2019-09-07 MED ORDER — HYDROCHLOROTHIAZIDE 12.5 MG PO CAPS: 12.5000 mg | ORAL_CAPSULE | Freq: Every day | ORAL | 0 refills | Status: DC

## 2019-09-13 MED ORDER — DILTIAZEM HCL 30 MG PO TABS: 30.0000 mg | ORAL_TABLET | Freq: Every evening | ORAL | 2 refills | Status: DC | PRN

## 2019-10-05 ENCOUNTER — Encounter: Payer: Self-pay | Admitting: Family Medicine

## 2019-10-05 ENCOUNTER — Other Ambulatory Visit: Payer: Self-pay

## 2019-10-05 ENCOUNTER — Ambulatory Visit (INDEPENDENT_AMBULATORY_CARE_PROVIDER_SITE_OTHER): Payer: Medicare PPO | Admitting: Family Medicine

## 2019-10-05 VITALS — BP 110/78 | HR 74 | Temp 97.3°F | Ht 67.0 in | Wt 248.2 lb

## 2019-10-05 DIAGNOSIS — N952 Postmenopausal atrophic vaginitis: Secondary | ICD-10-CM

## 2019-10-05 DIAGNOSIS — R1011 Right upper quadrant pain: Secondary | ICD-10-CM

## 2019-10-05 DIAGNOSIS — R109 Unspecified abdominal pain: Secondary | ICD-10-CM | POA: Insufficient documentation

## 2019-10-05 DIAGNOSIS — Z1231 Encounter for screening mammogram for malignant neoplasm of breast: Secondary | ICD-10-CM

## 2019-10-05 DIAGNOSIS — E7800 Pure hypercholesterolemia, unspecified: Secondary | ICD-10-CM

## 2019-10-05 DIAGNOSIS — R3129 Other microscopic hematuria: Secondary | ICD-10-CM

## 2019-10-05 DIAGNOSIS — R748 Abnormal levels of other serum enzymes: Secondary | ICD-10-CM

## 2019-10-05 DIAGNOSIS — I10 Essential (primary) hypertension: Secondary | ICD-10-CM | POA: Diagnosis not present

## 2019-10-05 DIAGNOSIS — E039 Hypothyroidism, unspecified: Secondary | ICD-10-CM

## 2019-10-05 LAB — POCT URINALYSIS DIPSTICK
Bilirubin, UA: NEGATIVE
Glucose, UA: NEGATIVE
Ketones, UA: NEGATIVE
Leukocytes, UA: NEGATIVE
Nitrite, UA: NEGATIVE
Protein, UA: NEGATIVE
Spec Grav, UA: <=1.005 — AB (ref 1.010–1.025)
Urobilinogen, UA: 0.2 E.U./dL
pH, UA: 7 (ref 5.0–8.0)

## 2019-10-05 MED ORDER — ROSUVASTATIN CALCIUM 10 MG PO TABS: ORAL_TABLET | ORAL | 1 refills | Status: DC

## 2019-10-05 MED ORDER — HYDROCHLOROTHIAZIDE 12.5 MG PO CAPS: 12.5000 mg | ORAL_CAPSULE | Freq: Two times a day (BID) | ORAL | 1 refills | Status: DC

## 2019-10-05 MED ORDER — PREMARIN 0.625 MG/GM VA CREA: TOPICAL_CREAM | VAGINAL | 3 refills | Status: DC

## 2019-10-05 MED ORDER — METOPROLOL SUCCINATE ER 25 MG PO TB24: 25.0000 mg | ORAL_TABLET | Freq: Two times a day (BID) | ORAL | 1 refills | Status: DC

## 2019-10-05 NOTE — Assessment & Plan Note (Signed)
Stable and well controlled hypertension.  BP is at goal < 130/80.  Pt is working on lifestyle modifications.  Taking medications tolerating well without side effects but does report increase in blood pressure by afternoon to 140's/80's.  In agreement with patient recommendation to increase her hydrochlorothiazide, but requested we try 12.5mg  in the morning and then a second dose just before lunch time, as her blood pressure is well controlled in clinic, early morning, and would not want to lower her pressure much more than it is in clinic.  Patient agreeable with plan and will check blood pressure before taking second hydrochlorothiazide tablet.  Plan: 1. Continue taking diltiazem 30mg  tablet daily, metoprolol succinate (Toprol XL) 25mg  twice daily and hydrocholorthiazide 12.5mg  daily.  ADD in an additional dose of hydrochlorothiazide 12.5 at lunchtime if blood pressure greater than 130/80. 2. Obtain labs ordered today in the next 7 days 3. Encouraged heart healthy diet and increasing exercise to 30 minutes most days of the week, going no more than 2 days in a row without exercise. 4. Check BP 1-2 x per week at home, keep log, and bring to clinic at next appointment. 5. Follow up 6 months.

## 2019-10-05 NOTE — Progress Notes (Signed)
Subjective:    Patient ID: Meghan Cole, female    DOB: 03-Nov-1950, 69 y.o.   MRN: [id number]  Meghan Cole is a 69 y.o. female presenting on 10/05/2019 for Hypertension and Abdominal Pain (Intermittent right abdominal that radiates up in the right arm. x 74yr She describe it as a sharp pain normally last a few minutes and subsided. The pain seems to be coming more frequent. The pain seems to worsen when she has been lying on her right side. )   HPI  Hypertension - She is checking BP at home or outside of clinic.    - Current medications: diltiazem 353mat bedtime, hydrochlorothiazide 12.75m49maily and metoprolol succinate (Toprol XL) 275m275mice daily, tolerating well without side effects - She is not currently symptomatic. - Pt denies headache, lightheadedness, dizziness, changes in vision, chest tightness/pressure, palpitations, leg swelling, sudden loss of speech or loss of consciousness. - She  reports no regular exercise routine. - Her diet is moderate in salt, moderate in fat, and moderate in carbohydrates.  Reports mid day she is finding that her blood pressure is elevating to 140's/80's, requesting to increase her hydrochlorothiazide from 12.75mg 66mly to 275mg 375my.  Reports she has been having right upper quadrant abdominal pain x 2 years.  Pain is worse in the morning, has modified her diet around the pain.  Reports pain is increasing in frequency.  Has not imaging or evaluation for this abdominal pain in the past.  Depression screen PHQ 2/[facility]/02/2019 06/15/2018 01/07/2018  Decreased Interest 0 0 0  Down, Depressed, Hopeless 0 0 0  PHQ - 2 Score 0 0 0  Altered sleeping - - 0  Tired, decreased energy - - 0  Change in appetite - - 0  Feeling bad or failure about yourself  - - 0  Trouble concentrating - - 0  Moving slowly or fidgety/restless - - 0  Suicidal thoughts - - 0  PHQ-9 Score - - 0  Difficult doing work/chores - - Not difficult at all    Social History   Tobacco Use    . 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 Alcohol use: No  . Drug use: No    Review of Systems  Constitutional: Negative.   HENT: Negative.   Eyes: Negative.   Respiratory: Negative.   Cardiovascular: Negative.   Gastrointestinal: Positive for abdominal pain. Negative for abdominal distention, anal bleeding, blood in stool, constipation, diarrhea, nausea, rectal pain and vomiting.  Endocrine: Negative.   Genitourinary: Negative.   Musculoskeletal: Negative.   Skin: Negative.   Allergic/Immunologic: Negative.   Neurological: Negative.   Hematological: Negative.   Psychiatric/Behavioral: Negative.    Per HPI unless specifically indicated above     Objective:    BP 110/78 (BP Location: Left Arm, Patient Position: Sitting, Cuff Size: Large)   Pulse 74   Temp (!) 97.3 F (36.3 C) (Temporal)   Ht '5\' 7"'  (1.702 m)   Wt 248 lb 3.2 oz (112.6 kg)   SpO2 99%   BMI 38.87 kg/m   Wt Readings from Last 3 Encounters:  10/05/19 248 lb 3.2 oz (112.6 kg)  03/21/19 241 lb 8 oz (109.5 kg)  02/15/19 241 lb 9.6 oz (109.6 kg)    Physical Exam Vitals reviewed.  Constitutional:      General: She is not in acute distress.    Appearance: Normal appearance. She is well-developed and well-groomed. She is obese. She is  not ill-appearing or toxic-appearing.  HENT:     Head: Normocephalic.     Nose:     Comments: Facemask in place covering mouth and nose Eyes:     General: Lids are normal. Vision grossly intact.        Right eye: No discharge.        Left eye: No discharge.     Extraocular Movements: Extraocular movements intact.     Conjunctiva/sclera: Conjunctivae normal.     Pupils: Pupils are equal, round, and reactive to light.  Cardiovascular:     Rate and Rhythm: Normal rate and regular rhythm.     Pulses: Normal pulses.          Dorsalis pedis pulses are 2+ on the right side and 2+ on the left side.       Posterior tibial pulses  are 2+ on the right side and 2+ on the left side.     Heart sounds: Normal heart sounds. No murmur. No friction rub. No gallop.   Pulmonary:     Effort: Pulmonary effort is normal. No respiratory distress.     Breath sounds: Normal breath sounds.  Abdominal:     General: Abdomen is flat. Bowel sounds are normal. There is no distension.     Palpations: Abdomen is soft. There is no hepatomegaly, splenomegaly, mass or pulsatile mass.     Tenderness: There is no abdominal tenderness. There is no guarding or rebound. Negative signs include Murphy's sign.     Hernia: No hernia is present.  Musculoskeletal:     Right lower leg: No edema.     Left lower leg: No edema.  Feet:     Right foot:     Skin integrity: Skin integrity normal.     Left foot:     Skin integrity: Skin integrity normal.  Skin:    General: Skin is warm and dry.     Capillary Refill: Capillary refill takes less than 2 seconds.  Neurological:     General: No focal deficit present.     Mental Status: She is alert and oriented to person, place, and time.     Cranial Nerves: No cranial nerve deficit.     Sensory: No sensory deficit.     Motor: No weakness.     Coordination: Coordination normal.     Gait: Gait normal.  Psychiatric:        Attention and Perception: Attention and perception normal.        Mood and Affect: Mood and affect normal.        Speech: Speech normal.        Behavior: Behavior normal. Behavior is cooperative.        Thought Content: Thought content normal.        Cognition and Memory: Cognition and memory normal.        Judgment: Judgment normal.     Results for orders placed or performed in visit on 10/05/19  POCT Urinalysis Dipstick  Result Value Ref Range   Color, UA yellow    Clarity, UA clear    Glucose, UA Negative Negative   Bilirubin, UA negative    Ketones, UA negative    Spec Grav, UA <=1.005 (A) 1.010 - 1.025   Blood, UA trace    pH, UA 7.0 5.0 - 8.0   Protein, UA Negative  Negative   Urobilinogen, UA 0.2 0.2 or 1.0 E.U./dL   Nitrite, UA negative    Leukocytes, UA Negative Negative  Appearance     Odor        Assessment & Plan:   Problem List Items Addressed This Visit      Cardiovascular and Mediastinum   Essential hypertension - Primary    Stable and well controlled hypertension.  BP is at goal < 130/80.  Pt is working on lifestyle modifications.  Taking medications tolerating well without side effects but does report increase in blood pressure by afternoon to 140's/80's.  In agreement with patient recommendation to increase her hydrochlorothiazide, but requested we try 12.78m in the morning and then a second dose just before lunch time, as her blood pressure is well controlled in clinic, early morning, and would not want to lower her pressure much more than it is in clinic.  Patient agreeable with plan and will check blood pressure before taking second hydrochlorothiazide tablet.  Plan: 1. Continue taking diltiazem 349mtablet daily, metoprolol succinate (Toprol XL) 2555mwice daily and hydrocholorthiazide 12.5mg62mily.  ADD in an additional dose of hydrochlorothiazide 12.5 at lunchtime if blood pressure greater than 130/80. 2. Obtain labs ordered today in the next 7 days 3. Encouraged heart healthy diet and increasing exercise to 30 minutes most days of the week, going no more than 2 days in a row without exercise. 4. Check BP 1-2 x per week at home, keep log, and bring to clinic at next appointment. 5. Follow up 6 months.         Relevant Medications   hydrochlorothiazide (MICROZIDE) 12.5 MG capsule   metoprolol succinate (TOPROL-XL) 25 MG 24 hr tablet   rosuvastatin (CRESTOR) 10 MG tablet   Other Relevant Orders   POCT Urinalysis Dipstick (Completed)     Endocrine   Adult hypothyroidism   Relevant Medications   metoprolol succinate (TOPROL-XL) 25 MG 24 hr tablet   Other Relevant Orders   Thyroid Panel With TSH     Other   Elevated liver  enzymes   Relevant Orders   CMP14+EGFR   US [place]omen Complete   Abdominal pain    Discussed with patient likely gallbladder pain, pain not present during clinic exam.  Given history and going on approximately 2 years, will have labs drawn and order ultrasound of the abdomen for further evaluation.  Based on results, can refer to gastroenterology/general surgery for evaluation.  Plan: 1. Labs to be drawn 2. US o[place]oreaAbdomen ordered      Relevant Orders   CBC with Differential   Lipid Profile   Amylase   Lipase   CMP14+EGFR   US [place]omen Complete    Other Visit Diagnoses    Encounter for screening mammogram for malignant neoplasm of breast       Relevant Orders   MM 3D SCREEN BREAST BILATERAL   Vaginal atrophy       Relevant Medications   conjugated estrogens (PREMARIN) vaginal cream   Pure hypercholesterolemia       Relevant Medications   hydrochlorothiazide (MICROZIDE) 12.5 MG capsule   metoprolol succinate (TOPROL-XL) 25 MG 24 hr tablet   rosuvastatin (CRESTOR) 10 MG tablet   Microscopic hematuria       Relevant Orders   Urinalysis, microscopic only      Meds ordered this encounter  Medications  . conjugated estrogens (PREMARIN) vaginal cream    Sig: Use as directed    Dispense:  90 g    Refill:  3  . hydrochlorothiazide (MICROZIDE) 12.5 MG capsule    Sig: Take 1 capsule (12.5 mg total)  by mouth 2 (two) times daily.    Dispense:  180 capsule    Refill:  1    Needs a follow up appointment for any additional refills.  Please have patient call our office.  . metoprolol succinate (TOPROL-XL) 25 MG 24 hr tablet    Sig: Take 1 tablet (25 mg total) by mouth 2 (two) times daily. Take with or immediately following a meal.    Dispense:  180 tablet    Refill:  1  . rosuvastatin (CRESTOR) 10 MG tablet    Sig: TAKE 1 TABLET(10 MG) BY MOUTH DAILY    Dispense:  90 tablet    Refill:  1      Follow up plan: Return in about 4 weeks (around 11/02/2019) for Abdominal pain  f/u.   Harlin Rain, [place] Family Nurse Practitioner [facility] 10/05/2019, 9:48 AM

## 2019-10-05 NOTE — Patient Instructions (Signed)
As we discussed, have your labs drawn in the next 1-2 weeks and we will contact you with the results.  Your medication refills have been sent to your pharmacy on file.  I have put in an order for an abdominal ultrasound for this right upper quadrant paint that has been going on x 2 years.  If you do not have a phone call within 1 week to contact our office and we will follow up on this.  As we discussed, I have added an additional dose of hydrochlorothiazide that you can take PRN in the afternoon if your blood pressure elevates over 130/80.  Be sure to take your blood pressure before taking that second dose.  For Mammogram screening for breast cancer   Call the Imaging Center below anytime to schedule your own appointment now that order has been placed.  [facility] [address] [place], [place] [address] Phone: [phone]  [facility] [address] [place], [place] [address] Phone: [phone]  Try to get exercise a minimum of 30 minutes per day at least 5 days per week as well as  adequate water intake all while measuring blood pressure a few times per week.  Keep a blood pressure log and bring back to clinic at your next visit.  If your readings are consistently over 140/90 to contact our office/send me a MyChart message and we will see you sooner.  Can try DASH and Mediterranean diet options, avoiding processed foods, lowering sodium intake, avoiding pork products, and eating a plant based diet for optimal health.  We will plan to see you back in 1 month for follow up on your abdominal pain  You will receive a survey after today's visit either digitally by e-mail or paper by USPS mail. Your experiences and feedback matter to [place].  Please respond so we know how we are doing as we provide care for you.  Call us with any questions/concerns/needs.  It is my goal to be available to you for your health  concerns.  Thanks for choosing me to be a partner in your healthcare needs!  Charlaine Dalton, FNP-C Family Nurse Practitioner [facility] Phone: [phone]

## 2019-10-05 NOTE — Assessment & Plan Note (Signed)
Discussed with patient likely gallbladder pain, pain not present during clinic exam.  Given history and going on approximately 2 years, will have labs drawn and order ultrasound of the abdomen for further evaluation.  Based on results, can refer to gastroenterology/general surgery for evaluation.  Plan: 1. Labs to be drawn 2. [place] of Abdomen ordered

## 2019-10-06 LAB — URINALYSIS, MICROSCOPIC ONLY
Bacteria, UA: NONE SEEN /HPF
Hyaline Cast: NONE SEEN /LPF
WBC, UA: NONE SEEN /HPF (ref 0–5)

## 2019-10-07 ENCOUNTER — Other Ambulatory Visit: Payer: Self-pay | Admitting: Family Medicine

## 2019-10-07 DIAGNOSIS — E7800 Pure hypercholesterolemia, unspecified: Secondary | ICD-10-CM

## 2019-10-07 DIAGNOSIS — I10 Essential (primary) hypertension: Secondary | ICD-10-CM

## 2019-10-07 DIAGNOSIS — E039 Hypothyroidism, unspecified: Secondary | ICD-10-CM

## 2019-10-07 LAB — CMP14+EGFR
ALT: 12 IU/L (ref 0–32)
AST: 23 IU/L (ref 0–40)
Albumin/Globulin Ratio: 1.4 (ref 1.2–2.2)
Albumin: 4 g/dL (ref 3.8–4.8)
Alkaline Phosphatase: 68 IU/L (ref 39–117)
BUN/Creatinine Ratio: 13 (ref 12–28)
BUN: 10 mg/dL (ref 8–27)
Bilirubin Total: 0.8 mg/dL (ref 0.0–1.2)
CO2: 28 mmol/L (ref 20–29)
Calcium: 9.6 mg/dL (ref 8.7–10.3)
Chloride: 99 mmol/L (ref 96–106)
Creatinine, Ser: 0.8 mg/dL (ref 0.57–1.00)
GFR calc Af Amer: 89 mL/min/{1.73_m2} (ref >59)
GFR calc non Af Amer: 77 mL/min/{1.73_m2} (ref >59)
Globulin, Total: 2.9 g/dL (ref 1.5–4.5)
Glucose: 91 mg/dL (ref 65–99)
Potassium: 4 mmol/L (ref 3.5–5.2)
Sodium: 139 mmol/L (ref 134–144)
Total Protein: 6.9 g/dL (ref 6.0–8.5)

## 2019-10-07 LAB — LIPID PANEL
Chol/HDL Ratio: 2 ratio (ref 0.0–4.4)
Cholesterol, Total: 140 mg/dL (ref 100–199)
HDL: 70 mg/dL (ref >39)
LDL Chol Calc (NIH): 57 mg/dL (ref 0–99)
Triglycerides: 60 mg/dL (ref 0–149)
VLDL Cholesterol Cal: 13 mg/dL (ref 5–40)

## 2019-10-07 LAB — CBC WITH DIFFERENTIAL/PLATELET
Basophils Absolute: 0.1 10*3/uL (ref 0.0–0.2)
Basos: 1 %
EOS (ABSOLUTE): 0.1 10*3/uL (ref 0.0–0.4)
Eos: 2 %
Hematocrit: 37.6 % (ref 34.0–46.6)
Hemoglobin: 12.6 g/dL (ref 11.1–15.9)
Immature Grans (Abs): 0 10*3/uL (ref 0.0–0.1)
Immature Granulocytes: 0 %
Lymphocytes Absolute: 1.5 10*3/uL (ref 0.7–3.1)
Lymphs: 30 %
MCH: 30.4 pg (ref 26.6–33.0)
MCHC: 33.5 g/dL (ref 31.5–35.7)
MCV: 91 fL (ref 79–97)
Monocytes Absolute: 0.7 10*3/uL (ref 0.1–0.9)
Monocytes: 15 %
Neutrophils Absolute: 2.6 10*3/uL (ref 1.4–7.0)
Neutrophils: 52 %
Platelets: 185 10*3/uL (ref 150–450)
RBC: 4.14 x10E6/uL (ref 3.77–5.28)
RDW: 13.2 % (ref 11.7–15.4)
WBC: 5 10*3/uL (ref 3.4–10.8)

## 2019-10-07 LAB — THYROID PANEL WITH TSH
Free Thyroxine Index: 3.1 (ref 1.2–4.9)
T3 Uptake Ratio: 30 % (ref 24–39)
T4, Total: 10.2 ug/dL (ref 4.5–12.0)
TSH: 1.45 u[IU]/mL (ref 0.450–4.500)

## 2019-10-07 LAB — LIPASE: Lipase: 19 U/L (ref 14–72)

## 2019-10-07 LAB — AMYLASE: Amylase: 56 U/L (ref 31–110)

## 2019-10-07 NOTE — Telephone Encounter (Signed)
Requested Prescriptions  Pending Prescriptions Disp Refills  . rosuvastatin (CRESTOR) 10 MG tablet [Pharmacy Med Name: ROSUVASTATIN 10MG  TABLETS] 30 tablet     Sig: TAKE 1 TABLET(10 MG) BY MOUTH DAILY     Cardiovascular:  Antilipid - Statins Passed - 10/07/2019  7:05 AM      Passed - Total Cholesterol in normal range and within 360 days    Cholesterol, Total  Date Value Ref Range Status  10/06/2019 140 100 - 199 mg/dL Final         Passed - LDL in normal range and within 360 days    LDL Chol Calc (NIH)  Date Value Ref Range Status  10/06/2019 57 0 - 99 mg/dL Final         Passed - HDL in normal range and within 360 days    HDL  Date Value Ref Range Status  10/06/2019 70 >39 mg/dL Final         Passed - Triglycerides in normal range and within 360 days    Triglycerides  Date Value Ref Range Status  10/06/2019 60 0 - 149 mg/dL Final         Passed - Patient is not pregnant      Passed - Valid encounter within last 12 months    Recent Outpatient Visits          2 days ago Essential hypertension   [facility], [facility], FNP   7 months ago Encounter for Jodelle Gross annual wellness exam   [facility], Kyung Rudd, NP   10 months ago Erroneous encounter - disregard   [facility], NP   1 year ago Essential hypertension   [facility], Kyung Rudd, NP   1 year ago Acute cystitis with hematuria   [facility], [facility], DO             . levothyroxine (SYNTHROID) 50 MCG tablet [Pharmacy Med Name: LEVOTHYROXINE 0.05MG  (Netta Neat) TAB] 90 tablet 4    Sig: TAKE 1 TABLET BY MOUTH BEFORE BREAKFAST     Endocrinology:  Hypothyroid Agents Failed - 10/07/2019  7:05 AM      Failed - TSH needs to be rechecked within 3 months after an abnormal result. Refill until TSH is due.      Passed - TSH in normal range and within 360 days    TSH  Date Value Ref  Range Status  10/06/2019 1.450 0.450 - 4.500 uIU/mL Final         Passed - Valid encounter within last 12 months    Recent Outpatient Visits          2 days ago Essential hypertension   [facility], [facility], FNP   7 months ago Encounter for Jodelle Gross annual wellness exam   [facility], Kyung Rudd, NP   10 months ago Erroneous encounter - disregard   [facility], NP   1 year ago Essential hypertension   [facility], NP   1 year ago Acute cystitis with hematuria   [facility], [facility], DO             . hydrochlorothiazide (MICROZIDE) 12.5 MG capsule [Pharmacy Med Name: HYDROCHLOROTHIAZIDE 12.5MG  CAPSULES] 30 capsule     Sig: TAKE 1 CAPSULE(12.5 MG) BY MOUTH DAILY  Cardiovascular: Diuretics - Thiazide Passed - 10/07/2019  7:05 AM      Passed - Ca in normal range and within 360 days    Calcium  Date Value Ref Range Status  10/06/2019 9.6 8.7 - 10.3 mg/dL Final         Passed - Cr in normal range and within 360 days    Creatinine, Ser  Date Value Ref Range Status  10/06/2019 0.80 0.57 - 1.00 mg/dL Final         Passed - K in normal range and within 360 days    Potassium  Date Value Ref Range Status  10/06/2019 4.0 3.5 - 5.2 mmol/L Final         Passed - Na in normal range and within 360 days    Sodium  Date Value Ref Range Status  10/06/2019 139 134 - 144 mmol/L Final         Passed - Last BP in normal range    BP Readings from Last 1 Encounters:  10/05/19 110/78         Passed - Valid encounter within last 6 months    Recent Outpatient Visits          2 days ago Essential hypertension   [facility], Lupita Raider, FNP   7 months ago Encounter for [employer] annual wellness exam   [facility] Merrilyn Puma, Jerrel Ivory, NP   10 months ago Erroneous encounter - disregard   [facility] Mikey [employer], NP   1 year ago Essential hypertension   [facility] [employer], NP   1 year ago Acute cystitis with hematuria   [facility], Devonne Doughty, DO

## 2019-10-09 ENCOUNTER — Other Ambulatory Visit: Payer: Self-pay | Admitting: Family Medicine

## 2019-10-09 DIAGNOSIS — E039 Hypothyroidism, unspecified: Secondary | ICD-10-CM

## 2019-10-09 MED ORDER — LEVOTHYROXINE SODIUM 50 MCG PO TABS: ORAL_TABLET | ORAL | 4 refills | Status: DC

## 2019-10-10 ENCOUNTER — Other Ambulatory Visit: Payer: Self-pay

## 2019-10-10 ENCOUNTER — Ambulatory Visit
Admission: RE | Admit: 2019-10-10 | Discharge: 2019-10-10 | Disposition: A | Discharge status: Home / Self Care | Payer: Medicare PPO | Source: Ambulatory Visit | Attending: Family Medicine | Admitting: Family Medicine

## 2019-10-10 ENCOUNTER — Other Ambulatory Visit: Payer: Self-pay | Admitting: Family Medicine

## 2019-10-10 DIAGNOSIS — N281 Cyst of kidney, acquired: Secondary | ICD-10-CM | POA: Insufficient documentation

## 2019-10-10 DIAGNOSIS — R1011 Right upper quadrant pain: Secondary | ICD-10-CM | POA: Insufficient documentation

## 2019-10-10 DIAGNOSIS — R748 Abnormal levels of other serum enzymes: Secondary | ICD-10-CM | POA: Insufficient documentation

## 2019-10-16 ENCOUNTER — Encounter: Payer: Self-pay | Admitting: Family Medicine

## 2019-10-16 ENCOUNTER — Ambulatory Visit
Admission: RE | Admit: 2019-10-16 | Discharge: 2019-10-16 | Disposition: A | Discharge status: Home / Self Care | Payer: Medicare PPO | Source: Ambulatory Visit | Attending: Family Medicine | Admitting: Family Medicine

## 2019-10-16 ENCOUNTER — Other Ambulatory Visit: Payer: Self-pay

## 2019-10-16 DIAGNOSIS — Z1231 Encounter for screening mammogram for malignant neoplasm of breast: Secondary | ICD-10-CM | POA: Diagnosis not present

## 2019-11-08 DIAGNOSIS — Z86018 Personal history of other benign neoplasm: Secondary | ICD-10-CM | POA: Diagnosis not present

## 2019-11-08 DIAGNOSIS — L578 Other skin changes due to chronic exposure to nonionizing radiation: Secondary | ICD-10-CM | POA: Diagnosis not present

## 2019-11-08 DIAGNOSIS — Z872 Personal history of diseases of the skin and subcutaneous tissue: Secondary | ICD-10-CM | POA: Diagnosis not present

## 2019-11-08 DIAGNOSIS — L718 Other rosacea: Secondary | ICD-10-CM | POA: Diagnosis not present

## 2019-11-24 DIAGNOSIS — N281 Cyst of kidney, acquired: Secondary | ICD-10-CM | POA: Diagnosis not present

## 2019-11-24 DIAGNOSIS — I10 Essential (primary) hypertension: Secondary | ICD-10-CM | POA: Diagnosis not present

## 2019-11-29 ENCOUNTER — Other Ambulatory Visit: Payer: Self-pay | Admitting: Cardiovascular Disease

## 2019-12-06 DIAGNOSIS — G4733 Obstructive sleep apnea (adult) (pediatric): Secondary | ICD-10-CM | POA: Diagnosis not present

## 2019-12-06 DIAGNOSIS — E669 Obesity, unspecified: Secondary | ICD-10-CM | POA: Diagnosis not present

## 2019-12-19 DIAGNOSIS — G4733 Obstructive sleep apnea (adult) (pediatric): Secondary | ICD-10-CM | POA: Diagnosis not present

## 2019-12-27 ENCOUNTER — Other Ambulatory Visit: Payer: Self-pay | Admitting: Family Medicine

## 2019-12-27 ENCOUNTER — Encounter: Payer: Self-pay | Admitting: Family Medicine

## 2019-12-27 DIAGNOSIS — N952 Postmenopausal atrophic vaginitis: Secondary | ICD-10-CM

## 2019-12-27 MED ORDER — PREMARIN 0.625 MG/GM VA CREA: TOPICAL_CREAM | VAGINAL | 3 refills | Status: DC

## 2019-12-28 ENCOUNTER — Other Ambulatory Visit: Payer: Self-pay | Admitting: Family Medicine

## 2019-12-28 DIAGNOSIS — N952 Postmenopausal atrophic vaginitis: Secondary | ICD-10-CM

## 2019-12-28 MED ORDER — PREMARIN 0.625 MG/GM VA CREA: TOPICAL_CREAM | VAGINAL | 3 refills | Status: DC

## 2019-12-28 NOTE — Telephone Encounter (Signed)
Can you find out with [facility] why they are dispensing one 30g tube per month?  The order was sent over for 90g ... should be a 90 day supply.Marland KitchenMarland Kitchen

## 2020-01-19 DIAGNOSIS — F411 Generalized anxiety disorder: Secondary | ICD-10-CM | POA: Diagnosis not present

## 2020-01-26 DIAGNOSIS — F411 Generalized anxiety disorder: Secondary | ICD-10-CM | POA: Diagnosis not present

## 2020-02-03 ENCOUNTER — Encounter: Payer: Self-pay | Admitting: Family Medicine

## 2020-02-05 ENCOUNTER — Other Ambulatory Visit: Payer: Self-pay | Admitting: Family Medicine

## 2020-02-05 DIAGNOSIS — N952 Postmenopausal atrophic vaginitis: Secondary | ICD-10-CM

## 2020-02-05 MED ORDER — PREMARIN 0.625 MG/GM VA CREA: TOPICAL_CREAM | VAGINAL | 3 refills | Status: DC

## 2020-02-09 DIAGNOSIS — F411 Generalized anxiety disorder: Secondary | ICD-10-CM | POA: Diagnosis not present

## 2020-02-16 DIAGNOSIS — F411 Generalized anxiety disorder: Secondary | ICD-10-CM | POA: Diagnosis not present

## 2020-02-23 DIAGNOSIS — F411 Generalized anxiety disorder: Secondary | ICD-10-CM | POA: Diagnosis not present

## 2020-03-01 DIAGNOSIS — F411 Generalized anxiety disorder: Secondary | ICD-10-CM | POA: Diagnosis not present

## 2020-03-15 DIAGNOSIS — F411 Generalized anxiety disorder: Secondary | ICD-10-CM | POA: Diagnosis not present

## 2020-03-25 NOTE — Progress Notes (Signed)
Cardiology Office Note  Date:  03/26/2020   ID:  Meghan Cole, DOB 03-23-1951, MRN [id number]  PCP:  Tarri Fuller, FNP   Chief Complaint  Patient presents with  . Other    12 month follow up. Meds reviewed verbally with patient.     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 borderline HTN Anxiety Hypothyroid  ASD repair, age 28, open heart surgery retired Engineer, civil (consulting) Followed by [facility] EP who presents for follow-up of her persistent  atrial fibrillation  Lost son, back in March 2021 See therapist Sisters, husband, are supports Still having a very difficult time  Since loss of her son has had significant insomnia, chest discomfort at rest Soon after loss of her son she reported feeling like a "steam roller crushing her", symptoms are now getting better Still with trouble , but getting better over all  Denies having any chest pain symptoms on exertion concerning for angina  Discussed previous CT coronary calcium scoring 1. Coronary calcium score of 430.  2.  An occluder device is seen in the interatrial septum.  EKG personally reviewed by myself on todays visit Shows atrial fibrillation ventricular rate 75 bpm nonspecific T wave abnormality precordial leads, No significant change  Other past medical history reviewed Echocardiogram October 2019 Left ventricle: The cavity size was normal. Wall thickness was   normal. Systolic function was normal. The estimated ejection   fraction was in the range of 55% to 60%. Wall motion was normal;   there were no regional wall motion abnormalities. Left   ventricular diastolic function parameters were normal. - Left atrium: The atrium was mildly dilated.   husband with bypass surgery and valve disease  Lab work reviewed  with her in detail TSH 1.3 Hemoglobin A1c 5.4 Total cholesterol 139, LDL 49 Normal BMP and LFTs  Seen in the office 09/2017 Propafenone increased up to 150 TID  With metoprolol succinate 25 BID DCCV Oct 13, 2017  lasted only 6 days  EKG 10/2017: atrial fibrillation at [facility] Propafenone stopped by [facility] 10/2017 [facility] EP Discussed other options for rhythm control including dofetilide loading vs ablation. Compliant with anticoagulation/eliquis  Other past medical history reviewed Did not tolerate flecainide, some nausea  Previous event monitor  Documenting atrial fibrillation Several episodes of atrial fibrillation both on May 19, first episode appeared regular and rapid, second episode or irregular consistent with atrial fibrillation Atrial fib last  Sat, 10/24/2016 10:45 Am, rate 154, up to 174 on tele,  Back to NSR 12:45 Had episode at 5 PM  Father with CAD, MI,  atrial fib  PMH:   has a past medical history of Anxiety (11/25/2015), Atrial septal defect, Chest pain (03/11/2018), Clinical depression (11/25/2015), 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  PSH:    Past Surgical History:  Procedure Laterality Date  . ATRIAL SEPTAL DEFECT(ASD) CLOSURE    . CARDIOVERSION N/A 10/13/2017   Procedure: CARDIOVERSION;  Surgeon: Antonieta Iba, MD;  Location: [facility] ORS;  Service: Cardiovascular;  Laterality: N/A;  . COLONOSCOPY WITH PROPOFOL     2011  . EXPLORATION POST OPERATIVE OPEN HEART      Current Outpatient Medications  Medication Sig Dispense Refill  . apixaban (ELIQUIS) 5 MG TABS tablet Take 1  tablet (5 mg total) by mouth 2 (two) times daily. 180 tablet 3  . cholecalciferol (VITAMIN D) 1000 units tablet Take 1 tablet (1,000 Units total) by mouth daily. (Patient taking differently: Take 2,000 Units by mouth daily. )    . conjugated estrogens (PREMARIN) vaginal cream Apply half a little finger length to skin nightly 30 g 3  . diltiazem (CARDIZEM) 30  MG tablet TAKE 1 TABLET(30 MG) BY MOUTH AT BEDTIME AS NEEDED 90 tablet 3  . diphenhydrAMINE (BENADRYL) 12.5 MG chewable tablet Chew 12.5 mg by mouth as needed for sleep.    . hydrochlorothiazide (MICROZIDE) 12.5 MG capsule Take 1 capsule (12.5 mg total) by mouth 2 (two) times daily. 180 capsule 1  . levothyroxine (SYNTHROID) 50 MCG tablet TAKE 1 TABLET BY MOUTH BEFORE BREAKFAST 90 tablet 4  . Melatonin-Pyridoxine 5-10 MG TBCR Take by mouth.    . metoprolol succinate (TOPROL-XL) 25 MG 24 hr tablet Take 1 tablet (25 mg total) by mouth 2 (two) times daily. Take with or immediately following a meal. 180 tablet 1  . rosuvastatin (CRESTOR) 10 MG tablet TAKE 1 TABLET(10 MG) BY MOUTH DAILY 90 tablet 1   No current facility-administered medications for this visit.     Allergies:   Patient has no known allergies.   Social History:  The patient  reports that she has quit smoking. She has quit using smokeless tobacco. She reports that she does not drink alcohol and does not use drugs.   Family History:   family history includes Breast cancer in her maternal grandmother; Cancer in her mother; Heart attack in her father; Heart disease in her father.   Review of Systems: Review of Systems  Constitutional: Negative.   HENT: Negative.   Respiratory: Negative.   Cardiovascular: Positive for chest pain.       Tachycardia  Gastrointestinal: Negative.   Musculoskeletal: Negative.   Neurological: Negative.   Psychiatric/Behavioral: Negative.   All other systems reviewed and are negative.   PHYSICAL EXAM: VS:  BP 120/80 (BP Location: Left Arm, Patient Position: Sitting, Cuff Size: Large)   Pulse 75   Ht 5\' 7"  (1.702 m)   Wt 247 lb (112 kg)   SpO2 98%   BMI 38.69 kg/m  , BMI Body mass index is 38.69 kg/m. Constitutional:  oriented to person, place, and time. No distress.  HENT:  Head: Grossly normal Eyes:  no discharge. No scleral icterus.  Neck: No JVD, no carotid bruits  Cardiovascular:  Regular rate and rhythm, no murmurs appreciated Pulmonary/Chest: Clear to auscultation bilaterally, no wheezes or rails Abdominal: Soft.  no distension.  no tenderness.  Musculoskeletal: Normal range of motion Neurological:  normal muscle tone. Coordination normal. No atrophy Skin: Skin warm and dry Psychiatric: normal affect, pleasant   Recent Labs: 10/06/2019: ALT 12; BUN 10; Creatinine, Ser 0.80; Hemoglobin 12.6; Platelets 185; Potassium 4.0; Sodium 139; TSH 1.450    Lipid Panel Lab Results  Component Value Date   CHOL 140 10/06/2019   HDL 70 10/06/2019   LDLCALC 57 10/06/2019   TRIG 60 10/06/2019      Wt Readings from Last 3 Encounters:  03/26/20 247 lb (112 kg)  10/05/19 248 lb 3.2 oz (112.6 kg)  03/21/19 241 lb 8 oz (109.5 kg)     ASSESSMENT AND PLAN:  Adjustment disorder Recent loss of her son, he was age 82, tragic loss Difficulty adjusting since March 2021 Recommend continued support from family, husband, daily exercise She is seeing a therapist  Slowly healing  Persistent atrial fibrillation Dating back to 2017  Eliquis 5 twice daily, on metoprolol, relatively asymptomatic  Chest pain Recent chest pain is atypical, likely in the setting of losing her son in March 2021 Calcium score in the 400s Prior smoking history 30 years but stopped 20 years ago Discussed if symptoms get worse and with exertion will call our office, additional testing could be performed  Tachycardia Not an active issue on today's visit  Essential hypertension - Blood pressure stable, no medication changes made  Morbid obesity We have encouraged continued exercise, careful diet management in an effort to lose weight.    Total encounter time more than 25 minutes  Greater than 50% was spent in counseling and coordination of care with the patient    No orders of the defined types were placed in this encounter.    Signed, Dossie Arbour, M.D., Ph.D. 03/26/2020  [facility] [place], [place] [phone]

## 2020-03-26 ENCOUNTER — Encounter: Payer: Self-pay | Admitting: Cardiovascular Disease

## 2020-03-26 ENCOUNTER — Ambulatory Visit: Payer: Medicare PPO | Admitting: Cardiovascular Disease

## 2020-03-26 ENCOUNTER — Other Ambulatory Visit: Payer: Self-pay

## 2020-03-26 VITALS — BP 120/80 | HR 75 | Ht 67.0 in | Wt 247.0 lb

## 2020-03-26 DIAGNOSIS — I708 Atherosclerosis of other arteries: Secondary | ICD-10-CM | POA: Diagnosis not present

## 2020-03-26 DIAGNOSIS — I10 Essential (primary) hypertension: Secondary | ICD-10-CM | POA: Diagnosis not present

## 2020-03-26 DIAGNOSIS — R079 Chest pain, unspecified: Secondary | ICD-10-CM | POA: Diagnosis not present

## 2020-03-26 DIAGNOSIS — I7090 Unspecified atherosclerosis: Secondary | ICD-10-CM

## 2020-03-26 DIAGNOSIS — Z87891 Personal history of nicotine dependence: Secondary | ICD-10-CM

## 2020-03-26 DIAGNOSIS — I4821 Permanent atrial fibrillation: Secondary | ICD-10-CM | POA: Diagnosis not present

## 2020-03-26 MED ORDER — APIXABAN 5 MG PO TABS: 5.0000 mg | ORAL_TABLET | Freq: Two times a day (BID) | ORAL | 3 refills | Status: DC

## 2020-03-26 NOTE — Patient Instructions (Signed)
Medication Instructions:  No changes  If you need a refill on your cardiac medications before your next appointment, please call your pharmacy.    Lab work: No new labs needed   If you have labs (blood work) drawn today and your tests are completely normal, you will receive your results only by: . MyChart Message (if you have MyChart) OR . A paper copy in the mail If you have any lab test that is abnormal or we need to change your treatment, we will call you to review the results.   Testing/Procedures: No new testing needed   Follow-Up: At [facility], you and your health needs are our priority.  As part of our continuing mission to provide you with exceptional heart care, we have created designated Provider Care Teams.  These Care Teams include your primary Cardiologist (physician) and Advanced Practice Providers (APPs -  Physician Assistants and Nurse Practitioners) who all work together to provide you with the care you need, when you need it.  . You will need a follow up appointment in 12 months  . Providers on your designated Care Team:   . Christopher Berge, NP . Ryan Dunn, PA-C . Jacquelyn Visser, PA-C  Any Other Special Instructions Will Be Listed Below (If Applicable).  COVID-19 Vaccine Information can be found at: [email or web address] For questions related to vaccine distribution or appointments, please email [email or web address] or call [phone].     

## 2020-04-01 ENCOUNTER — Other Ambulatory Visit: Payer: Self-pay

## 2020-04-01 ENCOUNTER — Encounter: Payer: Self-pay | Admitting: Family Medicine

## 2020-04-01 DIAGNOSIS — R300 Dysuria: Secondary | ICD-10-CM

## 2020-04-02 LAB — URINE CULTURE
MICRO NUMBER:: 11113796
Result:: NO GROWTH
SPECIMEN QUALITY:: ADEQUATE

## 2020-04-03 ENCOUNTER — Encounter: Payer: Self-pay | Admitting: Family Medicine

## 2020-04-12 DIAGNOSIS — F411 Generalized anxiety disorder: Secondary | ICD-10-CM | POA: Diagnosis not present

## 2020-04-17 ENCOUNTER — Other Ambulatory Visit: Payer: Self-pay | Admitting: Family Medicine

## 2020-04-17 DIAGNOSIS — E7800 Pure hypercholesterolemia, unspecified: Secondary | ICD-10-CM

## 2020-04-18 ENCOUNTER — Other Ambulatory Visit: Payer: Self-pay | Admitting: Family Medicine

## 2020-04-18 DIAGNOSIS — I10 Essential (primary) hypertension: Secondary | ICD-10-CM

## 2020-04-18 NOTE — Telephone Encounter (Signed)
Patient called, left VM to return the call to schedule a 6 month f/u with Joni Reining. As noted last OV 10/05/19 to return in 6 months. Courtesy refill given.

## 2020-04-26 DIAGNOSIS — F411 Generalized anxiety disorder: Secondary | ICD-10-CM | POA: Diagnosis not present

## 2020-05-17 DIAGNOSIS — F411 Generalized anxiety disorder: Secondary | ICD-10-CM | POA: Diagnosis not present

## 2020-06-03 ENCOUNTER — Encounter: Payer: Self-pay | Admitting: Family Medicine

## 2020-06-05 ENCOUNTER — Encounter: Payer: Self-pay | Admitting: Family Medicine

## 2020-06-05 DIAGNOSIS — I10 Essential (primary) hypertension: Secondary | ICD-10-CM

## 2020-06-06 MED ORDER — HYDROCHLOROTHIAZIDE 12.5 MG PO CAPS: 12.5000 mg | ORAL_CAPSULE | Freq: Two times a day (BID) | ORAL | 1 refills | Status: DC

## 2020-06-10 ENCOUNTER — Other Ambulatory Visit: Payer: Self-pay

## 2020-06-10 DIAGNOSIS — I10 Essential (primary) hypertension: Secondary | ICD-10-CM

## 2020-06-14 DIAGNOSIS — F411 Generalized anxiety disorder: Secondary | ICD-10-CM | POA: Diagnosis not present

## 2020-06-18 DIAGNOSIS — L720 Epidermal cyst: Secondary | ICD-10-CM | POA: Diagnosis not present

## 2020-06-18 DIAGNOSIS — L858 Other specified epidermal thickening: Secondary | ICD-10-CM | POA: Diagnosis not present

## 2020-06-20 DIAGNOSIS — G4733 Obstructive sleep apnea (adult) (pediatric): Secondary | ICD-10-CM | POA: Diagnosis not present

## 2020-06-24 ENCOUNTER — Other Ambulatory Visit: Payer: Self-pay | Admitting: *Deleted

## 2020-06-24 ENCOUNTER — Telehealth: Payer: Self-pay

## 2020-06-24 DIAGNOSIS — I10 Essential (primary) hypertension: Secondary | ICD-10-CM

## 2020-06-24 MED ORDER — METOPROLOL SUCCINATE ER 25 MG PO TB24: 25.0000 mg | ORAL_TABLET | Freq: Two times a day (BID) | ORAL | 3 refills | Status: DC

## 2020-06-24 MED ORDER — HYDROCHLOROTHIAZIDE 12.5 MG PO CAPS: 12.5000 mg | ORAL_CAPSULE | Freq: Two times a day (BID) | ORAL | 1 refills | Status: DC

## 2020-06-24 NOTE — Telephone Encounter (Signed)
HCTZ sent to pharmacy that pt requested. [facility] in [place].

## 2020-07-02 ENCOUNTER — Other Ambulatory Visit: Payer: Self-pay

## 2020-07-02 DIAGNOSIS — I10 Essential (primary) hypertension: Secondary | ICD-10-CM

## 2020-07-02 MED ORDER — HYDROCHLOROTHIAZIDE 12.5 MG PO CAPS: 12.5000 mg | ORAL_CAPSULE | Freq: Two times a day (BID) | ORAL | 3 refills | Status: DC

## 2020-08-13 ENCOUNTER — Other Ambulatory Visit: Payer: Self-pay

## 2020-08-13 DIAGNOSIS — E7800 Pure hypercholesterolemia, unspecified: Secondary | ICD-10-CM

## 2020-08-13 MED ORDER — ROSUVASTATIN CALCIUM 10 MG PO TABS: 10.0000 mg | ORAL_TABLET | Freq: Every day | ORAL | 1 refills | Status: DC

## 2020-09-06 ENCOUNTER — Telehealth: Payer: Self-pay | Admitting: Family Medicine

## 2020-09-06 NOTE — Telephone Encounter (Signed)
Copied from CRM [id number]. Topic: Medicare AWV >> Sep 06, 2020  3:03 PM Claudette Laws R wrote: Reason for CRM:   No answer unable to leave a message for patient to call back and schedule the Medicare Annual Wellness Visit (AWV) virtually or by telephone.  Last AWV  02/15/2019  Please schedule at anytime with [facility] Advisor.  40 minute appointment  Any questions, please call me at [phone]

## 2020-11-29 NOTE — Progress Notes (Signed)
[facility] [address] [place], [place] [address]  Pulmonary Sleep Medicine   Office Visit Note  Patient Name: Meghan Cole DOB: 24-Feb-1951 MRN [id number]  I connected with  Meghan Cole on 12/04/20 by a video enabled telemedicine application and verified that I am speaking with the correct person using two identifiers.   I discussed the limitations of evaluation and management by telemedicine. The patient expressed understanding and agreed to proceed.  Chief Complaint: Obstructive Sleep Apnea visit  Brief History:  Meghan Cole is seen today for initial consult for annual follow up on CPAP@5cmH20 . The patient has a 4 year history of sleep apnea. Patient is using PAP nightly.  The patient feels better after sleeping with PAP.  The patient reports benefit from PAP use. Reported sleepiness is  improved and the Epworth Sleepiness Score is 9 out of 24. The patient does take 1/2 hour naps after lunch. The patient complains of the following: none.  The compliance download shows 100% compliance with an average use time of 7.5 hours. The AHI is 4.9  The patient does not complain of limb movements disrupting sleep.  ROS  General: (-) fever, (-) chills, (-) night sweat Nose and Sinuses: (-) nasal stuffiness or itchiness, (-) postnasal drip, (-) nosebleeds, (-) sinus trouble. Mouth and Throat: (-) sore throat, (-) hoarseness. Neck: (-) swollen glands, (-) enlarged thyroid, (-) neck pain. Respiratory: - cough, - shortness of breath, + wheezing. Neurologic: - numbness, - tingling. Psychiatric: - anxiety, - depression   Current Medication: Outpatient Encounter Medications as of 12/04/2020  Medication Sig   apixaban (ELIQUIS) 5 MG TABS tablet Take 1 tablet (5 mg total) by mouth 2 (two) times daily.   cholecalciferol (VITAMIN D) 1000 units tablet Take 1 tablet (1,000 Units total) by mouth daily. (Patient taking differently: Take 2,000 Units by mouth daily. )   conjugated estrogens  (PREMARIN) vaginal cream Apply half a little finger length to skin nightly   diltiazem (CARDIZEM) 30 MG tablet TAKE 1 TABLET(30 MG) BY MOUTH AT BEDTIME AS NEEDED   diphenhydrAMINE (BENADRYL) 12.5 MG chewable tablet Chew 12.5 mg by mouth as needed for sleep.   hydrochlorothiazide (MICROZIDE) 12.5 MG capsule Take 1 capsule (12.5 mg total) by mouth 2 (two) times daily.   levothyroxine (SYNTHROID) 50 MCG tablet TAKE 1 TABLET BY MOUTH BEFORE BREAKFAST   Melatonin-Pyridoxine 5-10 MG TBCR Take by mouth.   metoprolol succinate (TOPROL-XL) 25 MG 24 hr tablet Take 1 tablet (25 mg total) by mouth 2 (two) times daily. Take with or immediately following a meal.   rosuvastatin (CRESTOR) 10 MG tablet Take 1 tablet (10 mg total) by mouth daily.   No facility-administered encounter medications on file as of 12/04/2020.    Surgical History: Past Surgical History:  Procedure Laterality Date   ATRIAL SEPTAL DEFECT(ASD) CLOSURE     CARDIOVERSION N/A 10/13/2017   Procedure: CARDIOVERSION;  Surgeon: Antonieta Iba, MD;  Location: [facility] ORS;  Service: Cardiovascular;  Laterality: N/A;   COLONOSCOPY WITH PROPOFOL     2011   EXPLORATION POST OPERATIVE OPEN HEART      Medical History: Past Medical History:  Diagnosis Date   Anxiety 11/25/2015   Atrial septal defect    Chest pain 03/11/2018   Clinical depression 11/25/2015   Gravida 2 para 2 11/25/2015   2.     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)  Tobacco Use   Smoking status: Former    Pack years: 0.00   Smokeless tobacco: Former  Haematologist Use: Never used  Substance and Sexual Activity   Alcohol use: No   Drug use: No   Sexual activity: Not on file  Other Topics Concern   Not on file  Social History Narrative   Not on file   Social Determinants of Health   Financial Resource Strain: Not on file  Food Insecurity: Not on file  Transportation Needs: Not on file  Physical Activity: Not on file  Stress: Not on file  Social Connections: Not on file  Intimate Partner Violence: Not on file    Vital Signs: Blood pressure (!) 107/59, pulse 63, height 5\' 7"  (1.702 m), SpO2 96 %.  Examination: General Appearance: The patient is well-developed, well-nourished, and in no distress. Neck Circumference:  Skin: Gross inspection of skin unremarkable. Head: normocephalic, no gross deformities. Eyes: no gross deformities noted. ENT: ears appear grossly normal Neurologic: Alert and oriented. No involuntary movements.    EPWORTH SLEEPINESS SCALE:  Scale:  (0)= no chance of dozing; (1)= slight chance of dozing; (2)= moderate chance of dozing; (3)= high chance of dozing  Chance  Situtation    Sitting and reading: 3    Watching TV: 0    Sitting Inactive in public: 0    As a passenger in car: 0      Lying down to rest: 3    Sitting and talking: 0    Sitting quielty after lunch: 3    In a car, stopped in traffic: 0   TOTAL SCORE:   9 out of 24    SLEEP STUDIES:  PSG 04/25/20 AHI 21 Spo19min 70%   CPAP COMPLIANCE DATA:  Date Range: 12/03/19-12/01/20  Average Daily Use: 7.5  Median Use: 7.5  Compliance for > 4 Hours: 100%  AHI: 4.9 respiratory events per hour  Days Used: 365/365  Mask Leak: 8.1  95th Percentile Pressure: 5         LABS: No results found for this or any previous visit (from the past 2160 hour(s)).  Radiology: MM 3D SCREEN BREAST BILATERAL  Result Date: 10/17/2019 CLINICAL DATA:  Screening. EXAM: DIGITAL SCREENING BILATERAL MAMMOGRAM WITH TOMO AND CAD  COMPARISON:  Previous exam(s). ACR Breast Density Category c: The breast tissue is heterogeneously dense, which may obscure small masses. FINDINGS: There are no findings suspicious for malignancy. Images were processed with CAD. IMPRESSION: No mammographic evidence of malignancy. A result letter of this screening mammogram will be mailed directly to the patient. RECOMMENDATION: Screening mammogram in one year. (Code:SM-B-01Y) BI-RADS CATEGORY  1: Negative. Electronically Signed   By: 12/17/2019 M.D.   On: 10/17/2019 11:52    No results found.  No results found.    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 The patient was reminded how to adjust mask fit and advised to change supplies more regularly since leak/AHI starts to increase with prolonged wear. The patient was also counselled on nightly use. The compliance is excellent. The AHI is 4.9.  1. OSA (obstructive sleep apnea) Continue excellent compliance  2. CPAP (continuous positive airway pressure) dependence CPAP couseling-Discussed importance of adequate CPAP use as well as proper care and cleaning techniques of machine and all supplies.  3. Paroxysmal atrial fibrillation (HCC) Followed by cardiology, on eliquis  4. Essential  hypertension Low this morning per pt however she states it normally rises throughout the day. Continue current medication and f/u with PCP.  5. Adult hypothyroidism Continue current medication and f/u with PCP.   General Counseling: I have discussed the findings of the evaluation and examination with Annabelle Harman.  I have also discussed any further diagnostic evaluation thatmay be needed or ordered today. Regenia verbalizes understanding of the findings of todays visit. We also reviewed her medications today and discussed drug interactions and side effects including but not limited excessive drowsiness and altered mental states. We also discussed that there is always a risk not just to her but also people around her. she has been encouraged to call the office with any questions or concerns that should arise related to todays visit.  No orders of the defined types were placed in this encounter.       I have personally obtained a history, examined the patient, evaluated laboratory and imaging results, formulated the assessment and plan and placed orders.  This patient was seen by Lynn Ito, PA-C in collaboration with Dr. Freda Munro as a part of collaborative care agreement.   Valentino Hue Sol Blazing, PhD, FAASM  Diplomate, American Board of Sleep Medicine    Yevonne Pax, MD [facility] Diplomate ABMS Pulmonary and Critical Care Medicine Sleep medicine

## 2020-12-04 ENCOUNTER — Other Ambulatory Visit: Payer: Self-pay

## 2020-12-04 ENCOUNTER — Ambulatory Visit (INDEPENDENT_AMBULATORY_CARE_PROVIDER_SITE_OTHER): Payer: Medicare PPO | Admitting: Internal Medicine

## 2020-12-04 ENCOUNTER — Other Ambulatory Visit: Payer: Self-pay | Admitting: Family Medicine

## 2020-12-04 DIAGNOSIS — E039 Hypothyroidism, unspecified: Secondary | ICD-10-CM

## 2020-12-04 DIAGNOSIS — I10 Essential (primary) hypertension: Secondary | ICD-10-CM

## 2020-12-04 DIAGNOSIS — G4733 Obstructive sleep apnea (adult) (pediatric): Secondary | ICD-10-CM

## 2020-12-04 DIAGNOSIS — Z9989 Dependence on other enabling machines and devices: Secondary | ICD-10-CM

## 2020-12-04 DIAGNOSIS — N952 Postmenopausal atrophic vaginitis: Secondary | ICD-10-CM

## 2020-12-04 DIAGNOSIS — I480 Paroxysmal atrial fibrillation: Secondary | ICD-10-CM | POA: Diagnosis not present

## 2020-12-04 NOTE — Patient Instructions (Signed)

## 2020-12-04 NOTE — Telephone Encounter (Signed)
  Notes to clinic:  Patient has appt tomorrow    Requested Prescriptions  Pending Prescriptions Disp Refills   levothyroxine (SYNTHROID) 50 MCG tablet 90 tablet 4    Sig: TAKE 1 TABLET BY MOUTH BEFORE BREAKFAST      Endocrinology:  Hypothyroid Agents Failed - 12/04/2020  8:56 AM      Failed - TSH needs to be rechecked within 3 months after an abnormal result. Refill until TSH is due.      Failed - TSH in normal range and within 360 days    TSH  Date Value Ref Range Status  10/06/2019 1.450 0.450 - 4.500 uIU/mL Final          Failed - Valid encounter within last 12 months    Recent Outpatient Visits           1 year ago Essential hypertension   [facility], Jodelle Gross, FNP   1 year ago Encounter for [employer] annual wellness exam   [facility] Kyung Rudd, Alison Stalling, NP   2 years ago Erroneous encounter - disregard   [facility] Galen Manila, NP   2 years ago Essential hypertension   [facility] Galen Manila, NP   2 years ago Acute cystitis with hematuria   [facility] Althea Charon, Netta Neat, DO       Future Appointments             Tomorrow Lorre Munroe, NP [facility]               conjugated estrogens (PREMARIN) vaginal cream 30 g 3    Sig: Apply half a little finger length to skin nightly      OB/GYN:  Estrogens Failed - 12/04/2020  8:56 AM      Failed - Valid encounter within last 12 months    Recent Outpatient Visits           1 year ago Essential hypertension   [facility], Jodelle Gross, FNP   1 year ago Encounter for [employer] annual wellness exam   [facility] Galen Manila, NP   2 years ago Erroneous encounter - disregard   [facility] Galen Manila, NP   2 years ago Essential hypertension   [facility] Galen Manila, NP   2 years ago  Acute cystitis with hematuria   [facility] Althea Charon Netta Neat, DO       Future Appointments             Tomorrow Sampson Si, Salvadore Oxford, NP [facility], [facility]             Passed - Mammogram is up-to-date per Health Maintenance      Passed - Last BP in normal range    BP Readings from Last 1 Encounters:  12/04/20 (!) 107/59

## 2020-12-04 NOTE — Telephone Encounter (Signed)
Copied from CRM [id number]. Topic: Quick Communication - Rx Refill/Question >> Dec 04, 2020  8:53 AM Gaetana Michaelis A wrote: Medication: levothyroxine (SYNTHROID) 50 MCG tablet   conjugated estrogens (PREMARIN) vaginal cream   Has the patient contacted their pharmacy? No. (Agent: If no, request that the patient contact the pharmacy for the refill.) (Agent: If yes, when and what did the pharmacy advise?)  Preferred Pharmacy (with phone number or street name): [facility] - [place], [place] - [address]  Phone:  [phone] Fax:  [phone]   Agent: Please be advised that RX refills may take up to 3 business days. We ask that you follow-up with your pharmacy.

## 2020-12-05 ENCOUNTER — Ambulatory Visit: Payer: Medicare PPO | Admitting: Internal Medicine

## 2020-12-05 ENCOUNTER — Other Ambulatory Visit: Payer: Self-pay

## 2020-12-05 ENCOUNTER — Encounter: Payer: Self-pay | Admitting: Internal Medicine

## 2020-12-05 VITALS — BP 144/63 | HR 66 | Ht 67.0 in | Wt 253.0 lb

## 2020-12-05 DIAGNOSIS — N952 Postmenopausal atrophic vaginitis: Secondary | ICD-10-CM | POA: Diagnosis not present

## 2020-12-05 DIAGNOSIS — I480 Paroxysmal atrial fibrillation: Secondary | ICD-10-CM

## 2020-12-05 DIAGNOSIS — E039 Hypothyroidism, unspecified: Secondary | ICD-10-CM

## 2020-12-05 DIAGNOSIS — I10 Essential (primary) hypertension: Secondary | ICD-10-CM

## 2020-12-05 DIAGNOSIS — E782 Mixed hyperlipidemia: Secondary | ICD-10-CM | POA: Insufficient documentation

## 2020-12-05 DIAGNOSIS — B182 Chronic viral hepatitis C: Secondary | ICD-10-CM | POA: Diagnosis not present

## 2020-12-05 DIAGNOSIS — R202 Paresthesia of skin: Secondary | ICD-10-CM

## 2020-12-05 DIAGNOSIS — G4733 Obstructive sleep apnea (adult) (pediatric): Secondary | ICD-10-CM

## 2020-12-05 DIAGNOSIS — Z6839 Body mass index (BMI) 39.0-39.9, adult: Secondary | ICD-10-CM

## 2020-12-05 MED ORDER — PREMARIN 0.625 MG/GM VA CREA: TOPICAL_CREAM | VAGINAL | 1 refills | Status: DC

## 2020-12-05 MED ORDER — LEVOTHYROXINE SODIUM 50 MCG PO TABS: ORAL_TABLET | ORAL | 1 refills | Status: DC

## 2020-12-05 NOTE — Assessment & Plan Note (Signed)
Slightly elevated today Continue HCTZ and Metoprolol Reinforced DASH diet and exercise weight loss C-Met today

## 2020-12-05 NOTE — Assessment & Plan Note (Signed)
In remission C-Met today

## 2020-12-05 NOTE — Patient Instructions (Signed)
Heart-Healthy Eating Plan Heart-healthy meal planning includes: Eating less unhealthy fats. Eating more healthy fats. Making other changes in your diet. Talk with your doctor or a diet specialist (dietitian) to create an eating plan that is right for you. What is my plan? Your doctor may recommend an eating plan that includes: Total fat: ______% or less of total calories a day. Saturated fat: ______% or less of total calories a day. Cholesterol: less than _________mg a day. What are tips for following this plan? Cooking Avoid frying your food. Try to bake, boil, grill, or broil it instead. You can also reduce fat by: Removing the skin from poultry. Removing all visible fats from meats. Steaming vegetables in water or broth. Meal planning  At meals, divide your plate into four equal parts: Fill one-half of your plate with vegetables and green salads. Fill one-fourth of your plate with whole grains. Fill one-fourth of your plate with lean protein foods. Eat 4-5 servings of vegetables per day. A serving of vegetables is: 1 cup of raw or cooked vegetables. 2 cups of raw leafy greens. Eat 4-5 servings of fruit per day. A serving of fruit is: 1 medium whole fruit.  cup of dried fruit.  cup of fresh, frozen, or canned fruit.  cup of 100% fruit juice. Eat more foods that have soluble fiber. These are apples, broccoli, carrots, beans, peas, and barley. Try to get 20-30 g of fiber per day. Eat 4-5 servings of nuts, legumes, and seeds per week: 1 serving of dried beans or legumes equals  cup after being cooked. 1 serving of nuts is  cup. 1 serving of seeds equals 1 tablespoon.  General information Eat more home-cooked food. Eat less restaurant, buffet, and fast food. Limit or avoid alcohol. Limit foods that are high in starch and sugar. Avoid fried foods. Lose weight if you are overweight. Keep track of how much salt (sodium) you eat. This is important if you have high blood  pressure. Ask your doctor to tell you more about this. Try to add vegetarian meals each week. Fats Choose healthy fats. These include olive oil and canola oil, flaxseeds, walnuts, almonds, and seeds. Eat more omega-3 fats. These include salmon, mackerel, sardines, tuna, flaxseed oil, and ground flaxseeds. Try to eat fish at least 2 times each week. Check food labels. Avoid foods with trans fats or high amounts of saturated fat. Limit saturated fats. These are often found in animal products, such as meats, butter, and cream. These are also found in plant foods, such as palm oil, palm kernel oil, and coconut oil. Avoid foods with partially hydrogenated oils in them. These have trans fats. Examples are stick margarine, some tub margarines, cookies, crackers, and other baked goods. What foods can I eat? Fruits All fresh, canned (in natural juice), or frozen fruits. Vegetables Fresh or frozen vegetables (raw, steamed, roasted, or grilled). Green salads. Grains Most grains. Choose whole wheat and whole grains most of the time. Rice andpasta, including brown rice and pastas made with whole wheat. Meats and other proteins Lean, well-trimmed beef, veal, pork, and lamb. Chicken and turkey without skin. All fish and shellfish. Wild duck, rabbit, pheasant, and venison. Egg whites or low-cholesterol egg substitutes. Dried beans, peas, lentils, and tofu. Seedsand most nuts. Dairy Low-fat or nonfat cheeses, including ricotta and mozzarella. Skim or 1% milk that is liquid, powdered, or evaporated. Buttermilk that is made with low-fatmilk. Nonfat or low-fat yogurt. Fats and oils Non-hydrogenated (trans-free) margarines. Vegetable oils, including soybean, sesame,   sunflower, olive, peanut, safflower, corn, canola, and cottonseed. Salad dressings or mayonnaisemade with a vegetable oil. Beverages Mineral water. Coffee and tea. Diet carbonated beverages. Sweets and desserts Sherbet, gelatin, and fruit ice. Small  amounts of dark chocolate. Limit all sweets and desserts. Seasonings and condiments All seasonings and condiments. The items listed above may not be a complete list of foods and drinks you can eat. Contact a dietitian for more options. What foods should I avoid? Fruits Canned fruit in heavy syrup. Fruit in cream or butter sauce. Fried fruit. Limitcoconut. Vegetables Vegetables cooked in cheese, cream, or butter sauce. Fried vegetables. Grains Breads that are made with saturated or trans fats, oils, or whole milk. Croissants. Sweet rolls. Donuts. High-fat crackers,such as cheese crackers. Meats and other proteins Fatty meats, such as hot dogs, ribs, sausage, bacon, rib-eye roast or steak. High-fat deli meats, such as salami and bologna. Caviar. Domestic duck andgoose. Organ meats, such as liver. Dairy Cream, sour cream, cream cheese, and creamed cottage cheese. Whole-milk cheeses. Whole or 2% milk that is liquid, evaporated, or condensed. Whole buttermilk. Cream sauce or high-fat cheese sauce. Yogurt that is made fromwhole milk. Fats and oils Meat fat, or shortening. Cocoa butter, hydrogenated oils, palm oil, coconut oil, palm kernel oil. Solid fats and shortenings, including bacon fat, salt pork, lard, and butter. Nondairy cream substitutes. Salad dressings with cheeseor sour cream. Beverages Regular sodas and juice drinks with added sugar. Sweets and desserts Frosting. Pudding. Cookies. Cakes. Pies. Milk chocolate or white chocolate.Buttered syrups. Full-fat ice cream or ice cream drinks. The items listed above may not be a complete list of foods and drinks to avoid. Contact a dietitian for more information. Summary Heart-healthy meal planning includes eating less unhealthy fats, eating more healthy fats, and making other changes in your diet. Eat a balanced diet. This includes fruits and vegetables, low-fat or nonfat dairy, lean protein, nuts and legumes, whole grains, and heart-healthy  oils and fats. This information is not intended to replace advice given to you by your health care provider. Make sure you discuss any questions you have with your healthcare provider. Document Revised: 07/29/2017 Document Reviewed: 07/02/2017 Elsevier Patient Education  2022 Elsevier Inc.  

## 2020-12-05 NOTE — Assessment & Plan Note (Signed)
Encourage diet and exercise for weight loss 

## 2020-12-05 NOTE — Assessment & Plan Note (Signed)
Continue Metoprolol and Eliquis CBC today She will continue to see cardiology, will follow

## 2020-12-05 NOTE — Assessment & Plan Note (Signed)
C-Met, lipid and A1c today Encouraged her to consume a low-fat diet Continue rosuvastatin

## 2020-12-05 NOTE — Progress Notes (Signed)
Subjective:    Patient ID: Meghan Cole, female    DOB: [birth year], 70 y.o.   MRN: [id number]  HPI  Patient presents the clinic today for follow-up of chronic conditions.  She is establishing care with me today, transferring care from [facility], NP.  HTN: Her BP today is 144/73.  She is taking HCTZ and Metoprolol as prescribed.  ECG from 03/2020 reviewed.  Hypothyroidism: She is taking Levothyroxine as prescribed.  She does not follow with endocrinology.  OSA: She averages 8 hours of sleep per night with the use of her CPAP.  Sleep study from 08/2018 reviewed.  History of Hep C: In remission s/p antiviral treatment.   A.Fib: Managed on Metoprolol and Eliquis.  ECG from 03/2020 reviewed.  She follows with cardiology.  HLD: Her last LDL was 57, triglycerides 60, 09/2019.  She denies myalgias on Rosuvastatin.  She tries to consume a low-fat diet.  Review of Systems     Past Medical History:  Diagnosis Date   Anxiety 11/25/2015   Atrial septal defect    Chest pain 03/11/2018   Clinical depression 11/25/2015   Gravida 2 para 2 11/25/2015   2.     Heart disease    Hypertension    Thyroid disease     Current Outpatient Medications  Medication Sig Dispense Refill   apixaban (ELIQUIS) 5 MG TABS tablet Take 1 tablet (5 mg total) by mouth 2 (two) times daily. 180 tablet 3   cholecalciferol (VITAMIN D) 1000 units tablet Take 1 tablet (1,000 Units total) by mouth daily. (Patient taking differently: Take 2,000 Units by mouth daily. )     conjugated estrogens (PREMARIN) vaginal cream Apply half a little finger length to skin nightly 30 g 3   diltiazem (CARDIZEM) 30 MG tablet TAKE 1 TABLET(30 MG) BY MOUTH AT BEDTIME AS NEEDED 90 tablet 3   diphenhydrAMINE (BENADRYL) 12.5 MG chewable tablet Chew 12.5 mg by mouth as needed for sleep.     hydrochlorothiazide (MICROZIDE) 12.5 MG capsule Take 1 capsule (12.5 mg total) by mouth 2 (two) times daily. 180 capsule 3   levothyroxine (SYNTHROID) 50  MCG tablet TAKE 1 TABLET BY MOUTH BEFORE BREAKFAST 90 tablet 4   Melatonin-Pyridoxine 5-10 MG TBCR Take by mouth.     metoprolol succinate (TOPROL-XL) 25 MG 24 hr tablet Take 1 tablet (25 mg total) by mouth 2 (two) times daily. Take with or immediately following a meal. 180 tablet 3   rosuvastatin (CRESTOR) 10 MG tablet Take 1 tablet (10 mg total) by mouth daily. 90 tablet 1   No current facility-administered medications for this visit.    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)  Tobacco Use   Smoking status: Former    Pack years: 0.00   Smokeless tobacco: Former  Building services engineer Use: Never used  Substance and  Sexual Activity   Alcohol use: No   Drug use: No   Sexual activity: Not on file  Other Topics Concern   Not on file  Social History Narrative   Not on file   Social Determinants of Health   Financial Resource Strain: Not on file  Food Insecurity: Not on file  Transportation Needs: Not on file  Physical Activity: Not on file  Stress: Not on file  Social Connections: Not on file  Intimate Partner Violence: Not on file     Constitutional: Denies fever, malaise, fatigue, headache or abrupt weight changes.  HEENT: Denies eye pain, eye redness, ear pain, ringing in the ears, wax buildup, runny nose, nasal congestion, bloody nose, or sore throat. Respiratory: Denies difficulty breathing, shortness of breath, cough or sputum production.   Cardiovascular: Denies chest pain, chest  tightness, palpitations or swelling in the hands or feet.  Gastrointestinal: Denies abdominal pain, bloating, constipation, diarrhea or blood in the stool.  GU: Denies urgency, frequency, pain with urination, burning sensation, blood in urine, odor or discharge. Musculoskeletal: Denies decrease in range of motion, difficulty with gait, muscle pain or joint pain and swelling.  Skin: Denies redness, rashes, lesions or ulcercations.  Neurological: She reports burning and numbness of her right lateral thigh. Denies dizziness, difficulty with memory, difficulty with speech or problems with balance and coordination.  Psych: Denies anxiety, depression, SI/HI.  No other specific complaints in a complete review of systems (except as listed in HPI above).  Objective:   Physical Exam  BP (!) 144/63   Pulse 66   Ht 5\' 7"  (1.702 m)   Wt 253 lb (114.8 kg)   SpO2 99%   BMI 39.63 kg/m   Wt Readings from Last 3 Encounters:  03/26/20 247 lb (112 kg)  10/05/19 248 lb 3.2 oz (112.6 kg)  03/21/19 241 lb 8 oz (109.5 kg)    General: Appears her stated age, obese, in NAD. Skin: Warm, dry and intact.  HEENT: Head: normal shape and size; Eyes: sclera white and EOMs intact;  Neck:  Neck supple, trachea midline. No masses, lumps present.  Cardiovascular: Normal rate and rhythm. S1,S2 noted.  No murmur, rubs or gallops noted. No JVD or BLE edema. No carotid bruits noted. Pulmonary/Chest: Normal effort and positive vesicular breath sounds. No respiratory distress. No wheezes, rales or ronchi noted.  Musculoskeletal: Pain with palpation of the lumbar spine. Strength 5/5 BLE. No difficulty with gait.  Neurological: Alert and oriented.  Psychiatric: Mood and affect normal. Behavior is normal. Judgment and thought content normal.    BMET    Component Value Date/Time   NA 139 10/06/2019 0905   K 4.0 10/06/2019 0905   CL 99 10/06/2019 0905   CO2 28 10/06/2019 0905   GLUCOSE 91 10/06/2019 0905   BUN 10  10/06/2019 0905   CREATININE 0.80 10/06/2019 0905   CALCIUM 9.6 10/06/2019 0905   GFRNONAA 77 10/06/2019 0905   GFRAA 89 10/06/2019 0905    Lipid Panel     Component Value Date/Time   CHOL 140 10/06/2019 0905   TRIG 60 10/06/2019 0905   HDL 70 10/06/2019 0905   CHOLHDL 2.0 10/06/2019 0905   LDLCALC 57 10/06/2019 0905    CBC    Component Value Date/Time   WBC 5.0 10/06/2019 0905   RBC 4.14 10/06/2019 0905   HGB 12.6 10/06/2019 0905   HCT 37.6 10/06/2019 0905   PLT 185 10/06/2019 0905   MCV 91 10/06/2019 0905  MCH 30.4 10/06/2019 0905   MCHC 33.5 10/06/2019 0905   RDW 13.2 10/06/2019 0905   LYMPHSABS 1.5 10/06/2019 0905   EOSABS 0.1 10/06/2019 0905   BASOSABS 0.1 10/06/2019 0905    Hgb A1C Lab Results  Component Value Date   HGBA1C 5.4 11/09/2017           Assessment & Plan:   Paresthesia of Right Thigh:  She denies low back pain but tender to palpation on exam Offered x-ray lumbar spine but she declines at this time Encourage stretching, Tylenol arthritis and ice  RTC in 6 months for annual exam Nicki Reaper, NP This visit occurred during the SARS-CoV-2 public health emergency.  Safety protocols were in place, including screening questions prior to the visit, additional usage of staff PPE, and extensive cleaning of exam room while observing appropriate contact time as indicated for disinfecting solutions.

## 2020-12-05 NOTE — Assessment & Plan Note (Signed)
TSH and free T4 today Levothyroxine refilled

## 2020-12-05 NOTE — Assessment & Plan Note (Signed)
Premarin cream refilled today.

## 2020-12-05 NOTE — Assessment & Plan Note (Signed)
Encourage weight loss as this can help reduce sleep apnea symptoms Continue CPAP 

## 2020-12-06 ENCOUNTER — Other Ambulatory Visit: Payer: Medicare PPO

## 2020-12-06 DIAGNOSIS — I10 Essential (primary) hypertension: Secondary | ICD-10-CM

## 2020-12-06 DIAGNOSIS — E782 Mixed hyperlipidemia: Secondary | ICD-10-CM

## 2020-12-06 DIAGNOSIS — E039 Hypothyroidism, unspecified: Secondary | ICD-10-CM

## 2020-12-07 LAB — LIPID PANEL
Cholesterol: 136 mg/dL (ref <200)
HDL: 63 mg/dL (ref >=50)
LDL Cholesterol (Calc): 57 mg/dL (calc)
Non-HDL Cholesterol (Calc): 73 mg/dL (calc) (ref <130)
Total CHOL/HDL Ratio: 2.2 (calc) (ref <5.0)
Triglycerides: 81 mg/dL (ref <150)

## 2020-12-07 LAB — COMPLETE METABOLIC PANEL WITH GFR
AG Ratio: 1.4 (calc) (ref 1.0–2.5)
ALT: 14 U/L (ref 6–29)
AST: 22 U/L (ref 10–35)
Albumin: 4 g/dL (ref 3.6–5.1)
Alkaline phosphatase (APISO): 65 U/L (ref 37–153)
BUN: 14 mg/dL (ref 7–25)
CO2: 35 mmol/L — ABNORMAL HIGH (ref 20–32)
Calcium: 9.8 mg/dL (ref 8.6–10.4)
Chloride: 97 mmol/L — ABNORMAL LOW (ref 98–110)
Creat: 0.67 mg/dL (ref 0.50–0.99)
GFR, Est African American: 105 mL/min/{1.73_m2} (ref >=60)
GFR, Est Non African American: 91 mL/min/{1.73_m2} (ref >=60)
Globulin: 2.9 g/dL (calc) (ref 1.9–3.7)
Glucose, Bld: 94 mg/dL (ref 65–99)
Potassium: 3.3 mmol/L — ABNORMAL LOW (ref 3.5–5.3)
Sodium: 138 mmol/L (ref 135–146)
Total Bilirubin: 1 mg/dL (ref 0.2–1.2)
Total Protein: 6.9 g/dL (ref 6.1–8.1)

## 2020-12-07 LAB — CBC
HCT: 39.3 % (ref 35.0–45.0)
Hemoglobin: 12.6 g/dL (ref 11.7–15.5)
MCH: 30.4 pg (ref 27.0–33.0)
MCHC: 32.1 g/dL (ref 32.0–36.0)
MCV: 94.7 fL (ref 80.0–100.0)
MPV: 10 fL (ref 7.5–12.5)
Platelets: 178 10*3/uL (ref 140–400)
RBC: 4.15 10*6/uL (ref 3.80–5.10)
RDW: 13.4 % (ref 11.0–15.0)
WBC: 4.9 10*3/uL (ref 3.8–10.8)

## 2020-12-07 LAB — HEMOGLOBIN A1C
Hgb A1c MFr Bld: 5.4 % of total Hgb (ref <5.7)
Mean Plasma Glucose: 108 mg/dL
eAG (mmol/L): 6 mmol/L

## 2020-12-07 LAB — TSH: TSH: 1.31 mIU/L (ref 0.40–4.50)

## 2020-12-07 LAB — T4, FREE: Free T4: 1.3 ng/dL (ref 0.8–1.8)

## 2020-12-13 ENCOUNTER — Other Ambulatory Visit: Payer: Self-pay

## 2021-01-13 ENCOUNTER — Encounter: Payer: Self-pay | Admitting: Internal Medicine

## 2021-01-13 DIAGNOSIS — N3001 Acute cystitis with hematuria: Secondary | ICD-10-CM

## 2021-01-15 ENCOUNTER — Other Ambulatory Visit (INDEPENDENT_AMBULATORY_CARE_PROVIDER_SITE_OTHER): Payer: Medicare PPO

## 2021-01-15 ENCOUNTER — Other Ambulatory Visit: Payer: Self-pay

## 2021-01-15 DIAGNOSIS — N3001 Acute cystitis with hematuria: Secondary | ICD-10-CM

## 2021-01-15 LAB — POCT URINALYSIS DIPSTICK
Bilirubin, UA: NEGATIVE
Glucose, UA: NEGATIVE
Ketones, UA: NEGATIVE
Nitrite, UA: NEGATIVE
Protein, UA: NEGATIVE
Spec Grav, UA: 1.01 (ref 1.010–1.025)
Urobilinogen, UA: 0.2 E.U./dL
pH, UA: 7 (ref 5.0–8.0)

## 2021-01-16 ENCOUNTER — Other Ambulatory Visit: Payer: Self-pay

## 2021-01-16 DIAGNOSIS — N3001 Acute cystitis with hematuria: Secondary | ICD-10-CM

## 2021-01-18 ENCOUNTER — Telehealth: Payer: Medicare PPO | Admitting: Nurse Practitioner

## 2021-01-18 DIAGNOSIS — N3000 Acute cystitis without hematuria: Secondary | ICD-10-CM | POA: Diagnosis not present

## 2021-01-18 LAB — URINE CULTURE
MICRO NUMBER:: 12231500
SPECIMEN QUALITY:: ADEQUATE

## 2021-01-18 MED ORDER — SULFAMETHOXAZOLE-TRIMETHOPRIM 800-160 MG PO TABS: 1.0000 | ORAL_TABLET | Freq: Two times a day (BID) | ORAL | 0 refills | Status: DC

## 2021-01-18 NOTE — Progress Notes (Signed)
E-Visit for Urinary Problems ° °We are sorry that you are not feeling well.  Here is how we plan to help! ° °Based on what you shared with me it looks like you most likely have a simple urinary tract infection. ° °A UTI (Urinary Tract Infection) is a bacterial infection of the bladder. ° °Most cases of urinary tract infections are simple to treat but a key part of your care is to encourage you to drink plenty of fluids and watch your symptoms carefully. ° °I have prescribed Bactrim DS One tablet twice a day for 5 days.  Your symptoms should gradually improve. Call us if the burning in your urine worsens, you develop worsening fever, back pain or pelvic pain or if your symptoms do not resolve after completing the antibiotic. ° °Urinary tract infections can be prevented by drinking plenty of water to keep your body hydrated.  Also be sure when you wipe, wipe from front to back and don't hold it in!  If possible, empty your bladder every 4 hours. ° °HOME CARE °Drink plenty of fluids °Compete the full course of the antibiotics even if the symptoms resolve °Remember, when you need to go…go. Holding in your urine can increase the likelihood of getting a UTI! °GET HELP RIGHT AWAY IF: °You cannot urinate °You get a high fever °Worsening back pain occurs °You see blood in your urine °You feel sick to your stomach or throw up °You feel like you are going to pass out ° °MAKE SURE YOU  °Understand these instructions. °Will watch your condition. °Will get help right away if you are not doing well or get worse. ° ° °Thank you for choosing an e-visit. ° °Your e-visit answers were reviewed by a board certified advanced clinical practitioner to complete your personal care plan. Depending upon the condition, your plan could have included both over the counter or prescription medications. ° °Please review your pharmacy choice. Make sure the pharmacy is open so you can pick up prescription now. If there is a problem, you may contact  your provider through MyChart messaging and have the prescription routed to another pharmacy.  Your safety is important to us. If you have drug allergies check your prescription carefully.  ° °For the next 24 hours you can use MyChart to ask questions about today's visit, request a non-urgent call back, or ask for a work or school excuse. °You will get an email in the next two days asking about your experience. I hope that your e-visit has been valuable and will speed your recovery. ° °5-10 minutes spent reviewing and documenting in chart. ° °

## 2021-02-01 ENCOUNTER — Other Ambulatory Visit: Payer: Self-pay | Admitting: Family Medicine

## 2021-02-01 DIAGNOSIS — E7800 Pure hypercholesterolemia, unspecified: Secondary | ICD-10-CM

## 2021-02-01 NOTE — Telephone Encounter (Signed)
Requested Prescriptions  Pending Prescriptions Disp Refills  . rosuvastatin (CRESTOR) 10 MG tablet [Pharmacy Med Name: ROSUVASTATIN CALCIUM 10 MG TAB] 90 tablet 3    Sig: TAKE 1 TABLET BY MOUTH ONCE DAILY     Cardiovascular:  Antilipid - Statins Passed - 02/01/2021 12:25 PM      Passed - Total Cholesterol in normal range and within 360 days    Cholesterol, Total  Date Value Ref Range Status  10/06/2019 140 100 - 199 mg/dL Final   Cholesterol  Date Value Ref Range Status  12/06/2020 136 <200 mg/dL Final         Passed - LDL in normal range and within 360 days    LDL Cholesterol (Calc)  Date Value Ref Range Status  12/06/2020 57 mg/dL (calc) Final    Comment:    Reference range: <100 . Desirable range <100 mg/dL for primary prevention;   <70 mg/dL for patients with CHD or diabetic patients  with > or = 2 CHD risk factors. Marland Kitchen LDL-C is now calculated using the Martin-Hopkins  calculation, which is a validated novel method providing  better accuracy than the Friedewald equation in the  estimation of LDL-C.  Horald Pollen et al. [place] Ahr. 7408;144(81): 2061-2068  (http://education.[facility].com/faq/FAQ164)          Passed - HDL in normal range and within 360 days    HDL  Date Value Ref Range Status  12/06/2020 63 > OR = 50 mg/dL Final  85/63/1497 70 >02 mg/dL Final         Passed - Triglycerides in normal range and within 360 days    Triglycerides  Date Value Ref Range Status  12/06/2020 81 <150 mg/dL Final         Passed - Patient is not pregnant      Passed - Valid encounter within last 12 months    Recent Outpatient Visits          1 month ago Chronic hepatitis C without hepatic coma (HCC)   [facility], Salvadore Oxford, NP   1 year ago Essential hypertension   [facility], Jodelle Gross, FNP   1 year ago Encounter for [employer] annual wellness exam   [facility] Galen Manila, NP   2 years ago Erroneous  encounter - disregard   [facility] Galen Manila, NP   2 years ago Essential hypertension   [facility] Galen Manila, NP      Future Appointments            In 1 month Gollan, Tollie Pizza, MD [facility]   In 4 months Lockland, Salvadore Oxford, NP [facility], [facility]

## 2021-02-28 DIAGNOSIS — F411 Generalized anxiety disorder: Secondary | ICD-10-CM | POA: Diagnosis not present

## 2021-03-02 ENCOUNTER — Other Ambulatory Visit: Payer: Self-pay | Admitting: Internal Medicine

## 2021-03-02 DIAGNOSIS — E039 Hypothyroidism, unspecified: Secondary | ICD-10-CM

## 2021-03-23 ENCOUNTER — Encounter: Payer: Self-pay | Admitting: Internal Medicine

## 2021-03-23 DIAGNOSIS — N952 Postmenopausal atrophic vaginitis: Secondary | ICD-10-CM

## 2021-03-24 ENCOUNTER — Other Ambulatory Visit: Payer: Self-pay | Admitting: Cardiovascular Disease

## 2021-03-24 DIAGNOSIS — I10 Essential (primary) hypertension: Secondary | ICD-10-CM

## 2021-03-24 MED ORDER — CELECOXIB 100 MG PO CAPS: 100.0000 mg | ORAL_CAPSULE | Freq: Every day | ORAL | 0 refills | Status: DC | PRN

## 2021-03-24 MED ORDER — PREMARIN 0.625 MG/GM VA CREA: TOPICAL_CREAM | Freq: Every day | VAGINAL | 0 refills | Status: DC

## 2021-03-24 NOTE — Addendum Note (Signed)
Addended by: Lorre Munroe on: 03/24/2021 02:28 PM   Modules accepted: Orders

## 2021-03-31 ENCOUNTER — Ambulatory Visit: Payer: Medicare PPO | Admitting: Cardiovascular Disease

## 2021-03-31 ENCOUNTER — Encounter: Payer: Self-pay | Admitting: Cardiovascular Disease

## 2021-03-31 ENCOUNTER — Other Ambulatory Visit: Payer: Self-pay

## 2021-03-31 VITALS — BP 120/88 | HR 70 | Ht 67.0 in | Wt 253.1 lb

## 2021-03-31 DIAGNOSIS — R079 Chest pain, unspecified: Secondary | ICD-10-CM | POA: Diagnosis not present

## 2021-03-31 DIAGNOSIS — Z87891 Personal history of nicotine dependence: Secondary | ICD-10-CM | POA: Diagnosis not present

## 2021-03-31 DIAGNOSIS — I4821 Permanent atrial fibrillation: Secondary | ICD-10-CM

## 2021-03-31 DIAGNOSIS — E7800 Pure hypercholesterolemia, unspecified: Secondary | ICD-10-CM | POA: Diagnosis not present

## 2021-03-31 DIAGNOSIS — I10 Essential (primary) hypertension: Secondary | ICD-10-CM

## 2021-03-31 DIAGNOSIS — I7090 Unspecified atherosclerosis: Secondary | ICD-10-CM

## 2021-03-31 MED ORDER — METOPROLOL SUCCINATE ER 25 MG PO TB24
ORAL_TABLET | ORAL | 3 refills | Status: DC
Start: 2021-03-31 — End: 2022-04-01

## 2021-03-31 MED ORDER — APIXABAN 5 MG PO TABS: 5.0000 mg | ORAL_TABLET | Freq: Two times a day (BID) | ORAL | 3 refills | Status: DC

## 2021-03-31 MED ORDER — HYDROCHLOROTHIAZIDE 12.5 MG PO CAPS: 12.5000 mg | ORAL_CAPSULE | Freq: Every day | ORAL | 3 refills | Status: DC

## 2021-03-31 NOTE — Progress Notes (Signed)
Cardiology Office Note  Date:  03/31/2021   ID:  Meghan Cole, DOB 1950-11-10, MRN [id number]  PCP:  Meghan Munroe, NP   Chief Complaint  Patient presents with   12 month follow up     "Doing well." Medications reviewed by the patient verbally.     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  who presents for follow-up of her permanent atrial fibrillation  LOV 10/21 Lost son in March 2021 In follow-up reports that she is doing relatively well Having some back issues, chronic Numb right flank lateral Left knee Does some regular exercise, New step 30 min a day Denies chest pain or shortness of breath on exertion  CT coronary calcium scoring 1. Coronary calcium score of 430.  2.  An occluder device is seen in the interatrial septum.  EKG personally reviewed by myself on todays visit Shows atrial fibrillation ventricular rate 75 bpm nonspecific T wave abnormality precordial leads, No significant change   Other past medical history reviewed Echocardiogram October 2019 Left ventricle: The cavity size was normal. Wall thickness was   normal. Systolic function was normal. The estimated ejection   fraction was in the range of 55% to 60%. Wall motion was normal;   there were no regional wall motion abnormalities. Left   ventricular diastolic function parameters were normal. - Left atrium: The atrium was mildly dilated.   husband with bypass surgery and valve disease  Seen in the office 09/2017 Propafenone increased up to 150 TID  With metoprolol succinate 25 BID DCCV Oct 13, 2017  lasted only 6 days  EKG 10/2017: atrial  fibrillation at [facility] Propafenone stopped by [facility] 10/2017 [facility] EP Discussed other options for rhythm control including dofetilide loading vs ablation. Compliant with anticoagulation/eliquis  Other past medical history reviewed Did not tolerate flecainide, some nausea  Previous event monitor  Documenting atrial fibrillation Several episodes of atrial fibrillation both on May 19, first episode appeared regular and rapid, second episode or irregular consistent with atrial fibrillation Atrial fib last  Sat, 10/24/2016 10:45 Am, rate 154, up to 174 on tele,  Back to NSR 12:45 Had episode at 5 PM  Father with CAD, MI,  atrial fib  PMH:   has a past medical history of Anxiety (11/25/2015), Atrial septal defect, Chest pain (03/11/2018), Clinical depression (11/25/2015), 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  PSH:    Past Surgical History:  Procedure Laterality Date   ATRIAL SEPTAL DEFECT(ASD) CLOSURE     CARDIOVERSION N/A 10/13/2017   Procedure: CARDIOVERSION;  Surgeon: Antonieta Iba, MD;  Location: [facility] ORS;  Service: Cardiovascular;  Laterality: N/A;   COLONOSCOPY WITH PROPOFOL     2011   EXPLORATION POST OPERATIVE OPEN HEART      Current Outpatient Medications  Medication Sig Dispense Refill   acetaminophen (TYLENOL) 650 MG CR tablet Take 650 mg by mouth every 8 (eight) hours as needed for pain.     apixaban (ELIQUIS) 5 MG TABS tablet Take 1 tablet (5 mg total) by mouth 2 (two) times daily. 180 tablet 3   celecoxib (CELEBREX) 100 MG  capsule Take 1 capsule (100 mg total) by mouth daily as needed. 90 capsule 0   cholecalciferol (VITAMIN D) 1000 units tablet Take 1 tablet (1,000 Units total) by mouth daily. (Patient taking differently: Take 2,000 Units by mouth daily.)     conjugated estrogens (PREMARIN) vaginal cream Place vaginally at bedtime. 20 g 0   diphenhydrAMINE (BENADRYL) 12.5 MG chewable tablet Chew 12.5 mg by mouth as needed for sleep.      hydrochlorothiazide (MICROZIDE) 12.5 MG capsule Take 12.5 mg by mouth daily.     levothyroxine (SYNTHROID) 50 MCG tablet TAKE 1 TABLET BY MOUTH BEFORE BREAKFAST 90 tablet 1   Melatonin-Pyridoxine 5-10 MG TBCR Take by mouth.     metoprolol succinate (TOPROL-XL) 25 MG 24 hr tablet TAKE 1 TABLET BY MOUTH TWICE DAILY. TAKEWITH OR IMMEDIATELY FOLLOWING A MEAL 180 tablet 0   Omeprazole Magnesium ([employer] OMEPRAZOLE MAGNESIUM) 20.6 (20 Base) MG CPDR Take by mouth daily.     rosuvastatin (CRESTOR) 10 MG tablet TAKE 1 TABLET BY MOUTH ONCE DAILY 90 tablet 3   sulfamethoxazole-trimethoprim (BACTRIM DS) 800-160 MG tablet Take 1 tablet by mouth 2 (two) times daily. 14 tablet 0   No current facility-administered medications for this visit.     Allergies:   Patient has no known allergies.   Social History:  The patient  reports that she has quit smoking. She has quit using smokeless tobacco. She reports that she does not drink alcohol and does not use drugs.   Family History:   family history includes Breast cancer in her maternal grandmother; Cancer in her mother; Heart attack in her father; Heart disease in her father.   Review of Systems: Review of Systems  Constitutional: Negative.   HENT: Negative.    Respiratory: Negative.    Cardiovascular:  Positive for chest pain.       Tachycardia  Gastrointestinal: Negative.   Musculoskeletal: Negative.   Neurological: Negative.   Psychiatric/Behavioral: Negative.    All other systems reviewed and are negative.  PHYSICAL EXAM: VS:  BP 120/88 (BP Location: Left Arm, Patient Position: Sitting, Cuff Size: Large)   Pulse 70   Ht 5\' 7"  (1.702 m)   Wt 253 lb 2 oz (114.8 kg)   SpO2 98%   BMI 39.64 kg/m  , BMI Body mass index is 39.64 kg/m. Constitutional:  oriented to person, place, and time. No distress.  HENT:  Head: Grossly normal Eyes:  no discharge. No scleral icterus.  Neck: No JVD, no carotid bruits  Cardiovascular: Regular rate and rhythm, no  murmurs appreciated Pulmonary/Chest: Clear to auscultation bilaterally, no wheezes or rails Abdominal: Soft.  no distension.  no tenderness.  Musculoskeletal: Normal range of motion Neurological:  normal muscle tone. Coordination normal. No atrophy Skin: Skin warm and dry Psychiatric: normal affect, pleasant  Recent Labs: 12/06/2020: ALT 14; BUN 14; Creat 0.67; Hemoglobin 12.6; Platelets 178; Potassium 3.3; Sodium 138; TSH 1.31    Lipid Panel Lab Results  Component Value Date   CHOL 136 12/06/2020   HDL 63 12/06/2020   LDLCALC 57 12/06/2020   TRIG 81 12/06/2020      Wt Readings from Last 3 Encounters:  03/31/21 253 lb 2 oz (114.8 kg)  12/05/20 253 lb (114.8 kg)  03/26/20 247 lb (112 kg)     ASSESSMENT AND PLAN:   Permanent atrial fibrillation Dating back to 2017  Eliquis 5 twice daily, on metoprolol,  Asymptomatic, no significant CHF symptoms  Chest pain No further symptoms Prior smoking  history 30 years but stopped 20 years ago No further ischemic work-up needed at this time  Essential hypertension - Blood pressure is well controlled on today's visit. No changes made to the medications.  Morbid obesity We have encouraged continued exercise, careful diet management in an effort to lose weight.    Total encounter time more than 25 minutes  Greater than 50% was spent in counseling and coordination of care with the patient    Orders Placed This Encounter  Procedures   EKG 12-Lead     Signed, Dossie Arbour, M.D., Ph.D. 03/31/2021  [facility] [place], [place] [phone]

## 2021-03-31 NOTE — Patient Instructions (Addendum)
Medication Instructions:  No changes  If you need a refill on your cardiac medications before your next appointment, please call your pharmacy.   Lab work: No new labs needed  Testing/Procedures: No new testing needed  Follow-Up: At [facility], you and your health needs are our priority.  As part of our continuing mission to provide you with exceptional heart care, we have created designated Provider Care Teams.  These Care Teams include your primary Cardiologist (physician) and Advanced Practice Providers (APPs -  Physician Assistants and Nurse Practitioners) who all work together to provide you with the care you need, when you need it.  You will need a follow up appointment in 12 months  Providers on your designated Care Team:   Christopher Berge, NP Ryan Dunn, PA-C Cadence Furth, PA-C  COVID-19 Vaccine Information can be found at: [email or web address] For questions related to vaccine distribution or appointments, please email [email or web address] or call [phone].   

## 2021-04-01 DIAGNOSIS — F411 Generalized anxiety disorder: Secondary | ICD-10-CM | POA: Diagnosis not present

## 2021-05-09 DIAGNOSIS — F411 Generalized anxiety disorder: Secondary | ICD-10-CM | POA: Diagnosis not present

## 2021-05-20 ENCOUNTER — Encounter: Payer: Self-pay | Admitting: Internal Medicine

## 2021-05-20 DIAGNOSIS — E039 Hypothyroidism, unspecified: Secondary | ICD-10-CM

## 2021-05-20 MED ORDER — LEVOTHYROXINE SODIUM 50 MCG PO TABS: ORAL_TABLET | ORAL | 0 refills | Status: DC

## 2021-05-20 MED ORDER — CELECOXIB 100 MG PO CAPS: 100.0000 mg | ORAL_CAPSULE | Freq: Two times a day (BID) | ORAL | 0 refills | Status: DC

## 2021-05-21 ENCOUNTER — Other Ambulatory Visit: Payer: Self-pay | Admitting: Internal Medicine

## 2021-05-21 DIAGNOSIS — E039 Hypothyroidism, unspecified: Secondary | ICD-10-CM

## 2021-05-21 NOTE — Telephone Encounter (Signed)
Duplicate request- Rx filled 05/20/21 Requested Prescriptions  Pending Prescriptions Disp Refills   levothyroxine (SYNTHROID) 50 MCG tablet [Pharmacy Med Name: LEVOTHYROXINE SODIUM 50 MCG TAB] 90 tablet 0    Sig: TAKE 1 TABLET BY MOUTH ONCE DAILY ON AN EMPTY STOMACH. WAIT 30 MINUTES BEFORE TAKING OTHER MEDS.     Endocrinology:  Hypothyroid Agents Failed - 05/21/2021  9:20 AM      Failed - TSH needs to be rechecked within 3 months after an abnormal result. Refill until TSH is due.      Passed - TSH in normal range and within 360 days    TSH  Date Value Ref Range Status  12/06/2020 1.31 0.40 - 4.50 mIU/L Final         Passed - Valid encounter within last 12 months    Recent Outpatient Visits          5 months ago Chronic hepatitis C without hepatic coma [facility])   [facility], Salvadore Oxford, NP   1 year ago Essential hypertension   [facility], Jodelle Gross, FNP   2 years ago Encounter for [employer] annual wellness exam   [facility] Galen Manila, NP   2 years ago Erroneous encounter - disregard   [facility] Galen Manila, NP   2 years ago Essential hypertension   [facility] Galen Manila, NP      Future Appointments            In 3 weeks Sampson Si, Salvadore Oxford, NP [facility], [facility]

## 2021-06-13 DIAGNOSIS — F411 Generalized anxiety disorder: Secondary | ICD-10-CM | POA: Diagnosis not present

## 2021-06-16 ENCOUNTER — Ambulatory Visit (INDEPENDENT_AMBULATORY_CARE_PROVIDER_SITE_OTHER): Payer: Medicare PPO | Admitting: Internal Medicine

## 2021-06-16 ENCOUNTER — Encounter: Payer: Self-pay | Admitting: Internal Medicine

## 2021-06-16 ENCOUNTER — Other Ambulatory Visit: Payer: Self-pay

## 2021-06-16 VITALS — BP 114/78 | HR 66 | Temp 97.7°F | Resp 17 | Ht 67.0 in | Wt 255.8 lb

## 2021-06-16 DIAGNOSIS — E039 Hypothyroidism, unspecified: Secondary | ICD-10-CM

## 2021-06-16 DIAGNOSIS — Z0001 Encounter for general adult medical examination with abnormal findings: Secondary | ICD-10-CM

## 2021-06-16 DIAGNOSIS — N952 Postmenopausal atrophic vaginitis: Secondary | ICD-10-CM | POA: Diagnosis not present

## 2021-06-16 DIAGNOSIS — Z23 Encounter for immunization: Secondary | ICD-10-CM | POA: Diagnosis not present

## 2021-06-16 MED ORDER — LEVOTHYROXINE SODIUM 50 MCG PO TABS: ORAL_TABLET | ORAL | 1 refills | Status: DC

## 2021-06-16 MED ORDER — CELECOXIB 100 MG PO CAPS: 100.0000 mg | ORAL_CAPSULE | Freq: Two times a day (BID) | ORAL | 1 refills | Status: DC

## 2021-06-16 MED ORDER — OMEPRAZOLE MAGNESIUM 20.6 (20 BASE) MG PO CPDR: 20.0000 mg | DELAYED_RELEASE_CAPSULE | Freq: Every day | ORAL | 1 refills | Status: DC

## 2021-06-16 MED ORDER — PREMARIN 0.625 MG/GM VA CREA: TOPICAL_CREAM | Freq: Every day | VAGINAL | 0 refills | Status: DC

## 2021-06-16 NOTE — Addendum Note (Signed)
Addended by: Lonna Cobb on: 06/16/2021 08:27 AM   Modules accepted: Orders

## 2021-06-16 NOTE — Patient Instructions (Signed)
Health Maintenance for Postmenopausal Women ?Menopause is a normal process in which your ability to get pregnant comes to an end. This process happens slowly over many months or years, usually between the ages of 48 and 55. Menopause is complete when you have missed your menstrual period for 12 months. ?It is important to talk with your health care provider about some of the most common conditions that affect women after menopause (postmenopausal women). These include heart disease, cancer, and bone loss (osteoporosis). Adopting a healthy lifestyle and getting preventive care can help to promote your health and wellness. The actions you take can also lower your chances of developing some of these common conditions. ?What are the signs and symptoms of menopause? ?During menopause, you may have the following symptoms: ?Hot flashes. These can be moderate or severe. ?Night sweats. ?Decrease in sex drive. ?Mood swings. ?Headaches. ?Tiredness (fatigue). ?Irritability. ?Memory problems. ?Problems falling asleep or staying asleep. ?Talk with your health care provider about treatment options for your symptoms. ?Do I need hormone replacement therapy? ?Hormone replacement therapy is effective in treating symptoms that are caused by menopause, such as hot flashes and night sweats. ?Hormone replacement carries certain risks, especially as you become older. If you are thinking about using estrogen or estrogen with progestin, discuss the benefits and risks with your health care provider. ?How can I reduce my risk for heart disease and stroke? ?The risk of heart disease, heart attack, and stroke increases as you age. One of the causes may be a change in the body's hormones during menopause. This can affect how your body uses dietary fats, triglycerides, and cholesterol. Heart attack and stroke are medical emergencies. There are many things that you can do to help prevent heart disease and stroke. ?Watch your blood pressure ?High  blood pressure causes heart disease and increases the risk of stroke. This is more likely to develop in people who have high blood pressure readings or are overweight. ?Have your blood pressure checked: ?Every 3-5 years if you are 18-39 years of age. ?Every year if you are 40 years old or older. ?Eat a healthy diet ? ?Eat a diet that includes plenty of vegetables, fruits, low-fat dairy products, and lean protein. ?Do not eat a lot of foods that are high in solid fats, added sugars, or sodium. ?Get regular exercise ?Get regular exercise. This is one of the most important things you can do for your health. Most adults should: ?Try to exercise for at least 150 minutes each week. The exercise should increase your heart rate and make you sweat (moderate-intensity exercise). ?Try to do strengthening exercises at least twice each week. Do these in addition to the moderate-intensity exercise. ?Spend less time sitting. Even light physical activity can be beneficial. ?Other tips ?Work with your health care provider to achieve or maintain a healthy weight. ?Do not use any products that contain nicotine or tobacco. These products include cigarettes, chewing tobacco, and vaping devices, such as e-cigarettes. If you need help quitting, ask your health care provider. ?Know your numbers. Ask your health care provider to check your cholesterol and your blood sugar (glucose). Continue to have your blood tested as directed by your health care provider. ?Do I need screening for cancer? ?Depending on your health history and family history, you may need to have cancer screenings at different stages of your life. This may include screening for: ?Breast cancer. ?Cervical cancer. ?Lung cancer. ?Colorectal cancer. ?What is my risk for osteoporosis? ?After menopause, you may be   at increased risk for osteoporosis. Osteoporosis is a condition in which bone destruction happens more quickly than new bone creation. To help prevent osteoporosis or  the bone fractures that can happen because of osteoporosis, you may take the following actions: ?If you are 19-50 years old, get at least 1,000 mg of calcium and at least 600 international units (IU) of vitamin D per day. ?If you are older than age 50 but younger than age 70, get at least 1,200 mg of calcium and at least 600 international units (IU) of vitamin D per day. ?If you are older than age 70, get at least 1,200 mg of calcium and at least 800 international units (IU) of vitamin D per day. ?Smoking and drinking excessive alcohol increase the risk of osteoporosis. Eat foods that are rich in calcium and vitamin D, and do weight-bearing exercises several times each week as directed by your health care provider. ?How does menopause affect my mental health? ?Depression may occur at any age, but it is more common as you become older. Common symptoms of depression include: ?Feeling depressed. ?Changes in sleep patterns. ?Changes in appetite or eating patterns. ?Feeling an overall lack of motivation or enjoyment of activities that you previously enjoyed. ?Frequent crying spells. ?Talk with your health care provider if you think that you are experiencing any of these symptoms. ?General instructions ?See your health care provider for regular wellness exams and vaccines. This may include: ?Scheduling regular health, dental, and eye exams. ?Getting and maintaining your vaccines. These include: ?Influenza vaccine. Get this vaccine each year before the flu season begins. ?Pneumonia vaccine. ?Shingles vaccine. ?Tetanus, diphtheria, and pertussis (Tdap) booster vaccine. ?Your health care provider may also recommend other immunizations. ?Tell your health care provider if you have ever been abused or do not feel safe at home. ?Summary ?Menopause is a normal process in which your ability to get pregnant comes to an end. ?This condition causes hot flashes, night sweats, decreased interest in sex, mood swings, headaches, or lack  of sleep. ?Treatment for this condition may include hormone replacement therapy. ?Take actions to keep yourself healthy, including exercising regularly, eating a healthy diet, watching your weight, and checking your blood pressure and blood sugar levels. ?Get screened for cancer and depression. Make sure that you are up to date with all your vaccines. ?This information is not intended to replace advice given to you by your health care provider. Make sure you discuss any questions you have with your health care provider. ?Document Revised: 10/14/2020 Document Reviewed: 10/14/2020 ?Elsevier Patient Education ? 2022 [employer] ? ?

## 2021-06-16 NOTE — Progress Notes (Signed)
Subjective:    Patient ID: Meghan Cole, female    DOB: [birth year], 71 y.o.   MRN: [id number]  HPI  Patient presents the clinic today for her annual exam.  Flu: 02/2021 Tetanus: 06/2010 COVID: Pfizer x4 Pneumovax: Never Prevnar: 02/2019 Shingrix: 10/2016, 12/2016 Pap smear: 11/2017 Mammogram: 10/2019 Bone density: Never Colon screening: 02/2019, Cologuard Vision screening: annually Dentist: biannually  Diet: She does eat meat. She consume more veggies than fruit. She does eat some fried foods. She drinks mostly water. Exercise: Recumbent elliptical 30 minutes daily  Review of Systems     Past Medical History:  Diagnosis Date   Anxiety 11/25/2015   Atrial septal defect    Chest pain 03/11/2018   Clinical depression 11/25/2015   Gravida 2 para 2 11/25/2015   2.     Heart disease    Hypertension    Thyroid disease     Current Outpatient Medications  Medication Sig Dispense Refill   acetaminophen (TYLENOL) 650 MG CR tablet Take 650 mg by mouth every 8 (eight) hours as needed for pain.     apixaban (ELIQUIS) 5 MG TABS tablet Take 1 tablet (5 mg total) by mouth 2 (two) times daily. 180 tablet 3   celecoxib (CELEBREX) 100 MG capsule Take 1 capsule (100 mg total) by mouth 2 (two) times daily. 180 capsule 0   cholecalciferol (VITAMIN D) 1000 units tablet Take 1 tablet (1,000 Units total) by mouth daily. (Patient taking differently: Take 2,000 Units by mouth daily.)     conjugated estrogens (PREMARIN) vaginal cream Place vaginally at bedtime. 20 g 0   diphenhydrAMINE (BENADRYL) 12.5 MG chewable tablet Chew 12.5 mg by mouth as needed for sleep.     hydrochlorothiazide (MICROZIDE) 12.5 MG capsule Take 1 capsule (12.5 mg total) by mouth daily. 90 capsule 3   levothyroxine (SYNTHROID) 50 MCG tablet TAKE 1 TABLET BY MOUTH BEFORE BREAKFAST 90 tablet 0   Melatonin-Pyridoxine 5-10 MG TBCR Take by mouth.     metoprolol succinate (TOPROL-XL) 25 MG 24 hr tablet TAKE 1 TABLET BY MOUTH TWICE DAILY.  TAKEWITH OR IMMEDIATELY FOLLOWING A MEAL 180 tablet 3   Omeprazole Magnesium ([employer] OMEPRAZOLE MAGNESIUM) 20.6 (20 Base) MG CPDR Take by mouth daily.     rosuvastatin (CRESTOR) 10 MG tablet TAKE 1 TABLET BY MOUTH ONCE DAILY 90 tablet 3   sulfamethoxazole-trimethoprim (BACTRIM DS) 800-160 MG tablet Take 1 tablet by mouth 2 (two) times daily. 14 tablet 0   No current facility-administered medications for this visit.    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)  Tobacco Use   Smoking status: Former   Smokeless tobacco: Former  Scientific laboratory technician Use: Never used  Substance and Sexual Activity   Alcohol use: No   Drug use: No   Sexual activity: Not on file  Other Topics Concern  Not on file  Social History Narrative   Not on file   Social Determinants of Health   Financial Resource Strain: Not on file  Food Insecurity: Not on file  Transportation Needs: Not on file  Physical Activity: Not on file  Stress: Not on file  Social Connections: Not on file  Intimate Partner Violence: Not on file     Constitutional: Denies fever, malaise, fatigue, headache or abrupt weight changes.  HEENT: Denies eye pain, eye redness, ear pain, ringing in the ears, wax buildup, runny nose, nasal congestion, bloody nose, or sore throat. Respiratory: Denies difficulty breathing, shortness of breath, cough or sputum production.   Cardiovascular: Denies chest pain, chest tightness, palpitations or swelling in the  hands or feet.  Gastrointestinal: Denies abdominal pain, bloating, constipation, diarrhea or blood in the stool.  GU: Denies urgency, frequency, pain with urination, burning sensation, blood in urine, odor or discharge. Musculoskeletal: Denies decrease in range of motion, difficulty with gait, muscle pain or joint pain and swelling.  Skin: Denies redness, rashes, lesions or ulcercations.  Neurological: Denies dizziness, difficulty with memory, difficulty with speech or problems with balance and coordination.  Psych: Denies anxiety, depression, SI/HI.  No other specific complaints in a complete review of systems (except as listed in HPI above).  Objective:   Physical Exam  BP 114/78    Pulse 66    Temp 97.7 F (36.5 C) (Temporal)    Resp 17    Ht '5\' 7"'  (1.702 m)    Wt 255 lb 12.8 oz (116 kg)    SpO2 99%    BMI 40.06 kg/m   Wt Readings from Last 3 Encounters:  03/31/21 253 lb 2 oz (114.8 kg)  12/05/20 253 lb (114.8 kg)  03/26/20 247 lb (112 kg)    General: Appears her stated age, obese, in NAD. Skin: Warm, dry and intact.  HEENT: Head: normal shape and size; Eyes: sclera white and EOMs intact;  Neck:  Neck supple, trachea midline. No masses, lumps or thyromegaly present.  Cardiovascular: Normal rate and rhythm. S1,S2 noted.  No murmur, rubs or gallops noted. No JVD or BLE edema. No carotid bruits noted. Pulmonary/Chest: Normal effort and positive vesicular breath sounds. No respiratory distress. No wheezes, rales or ronchi noted.  Abdomen: Soft and nontender. Normal bowel sounds.  Musculoskeletal: Strength 5/5 BUE/BLE. No difficulty with gait.  Neurological: Alert and oriented. Cranial nerves II-XII grossly intact. Coordination normal.  Psychiatric: Mood and affect normal. Behavior is normal. Judgment and thought content normal.    BMET    Component Value Date/Time   NA 138 12/06/2020 0742   NA 139 10/06/2019 0905   K 3.3 (L) 12/06/2020 0742   CL 97 (L) 12/06/2020 0742   CO2  35 (H) 12/06/2020 0742   GLUCOSE 94 12/06/2020 0742   BUN 14 12/06/2020 0742   BUN 10 10/06/2019 0905   CREATININE 0.67 12/06/2020 0742   CALCIUM 9.8 12/06/2020 0742   GFRNONAA 91 12/06/2020 0742   GFRAA 105 12/06/2020 0742    Lipid Panel     Component Value Date/Time   CHOL 136 12/06/2020 0742   CHOL 140 10/06/2019 0905   TRIG 81 12/06/2020 0742   HDL 63 12/06/2020 0742   HDL 70 10/06/2019 0905   CHOLHDL 2.2 12/06/2020 0742   LDLCALC 57 12/06/2020 0742    CBC    Component Value Date/Time   WBC 4.9 12/06/2020 0742   RBC 4.15 12/06/2020 0742   HGB 12.6 12/06/2020 0742  HGB 12.6 10/06/2019 0905   HCT 39.3 12/06/2020 0742   HCT 37.6 10/06/2019 0905   PLT 178 12/06/2020 0742   PLT 185 10/06/2019 0905   MCV 94.7 12/06/2020 0742   MCV 91 10/06/2019 0905   MCH 30.4 12/06/2020 0742   MCHC 32.1 12/06/2020 0742   RDW 13.4 12/06/2020 0742   RDW 13.2 10/06/2019 0905   LYMPHSABS 1.5 10/06/2019 0905   EOSABS 0.1 10/06/2019 0905   BASOSABS 0.1 10/06/2019 0905    Hgb A1C Lab Results  Component Value Date   HGBA1C 5.4 12/06/2020            Assessment & Plan:   Preventative Health Maintenance:  Flu shot UTD She declines tetanus for financial reasons.  Advised her if she gets bit or cut she will need to go get this done Encouraged her to get her COVID booster Pneumovax today Prevnar UTD Shingrix UTD Pap smear due 2024 Mammogram due, she declines screening at this time Bone density due, she declines screening at this tie Cologuard due 02/2022 Encouraged her to consume a balanced diet and exercise regimen Advised her to see an eye doctor and dentist annually Will check CBC, c-Met, TSH, free T4, lipid, A1c today  RTC in 6 months, follow-up chronic conditions Webb Silversmith, NP This visit occurred during the SARS-CoV-2 public health emergency.  Safety protocols were in place, including screening questions prior to the visit, additional usage of staff PPE, and  extensive cleaning of exam room while observing appropriate contact time as indicated for disinfecting solutions.

## 2021-06-16 NOTE — Assessment & Plan Note (Signed)
Encouraged diet and exercise for weight loss ?

## 2021-06-17 ENCOUNTER — Other Ambulatory Visit: Payer: Medicare PPO

## 2021-06-17 ENCOUNTER — Other Ambulatory Visit: Payer: Self-pay | Admitting: Internal Medicine

## 2021-06-17 DIAGNOSIS — Z0001 Encounter for general adult medical examination with abnormal findings: Secondary | ICD-10-CM | POA: Diagnosis not present

## 2021-06-17 DIAGNOSIS — E039 Hypothyroidism, unspecified: Secondary | ICD-10-CM | POA: Diagnosis not present

## 2021-06-17 DIAGNOSIS — N952 Postmenopausal atrophic vaginitis: Secondary | ICD-10-CM

## 2021-06-18 ENCOUNTER — Encounter: Payer: Self-pay | Admitting: Internal Medicine

## 2021-06-18 DIAGNOSIS — N952 Postmenopausal atrophic vaginitis: Secondary | ICD-10-CM

## 2021-06-18 LAB — CBC
HCT: 37.8 % (ref 35.0–45.0)
Hemoglobin: 12.3 g/dL (ref 11.7–15.5)
MCH: 30.8 pg (ref 27.0–33.0)
MCHC: 32.5 g/dL (ref 32.0–36.0)
MCV: 94.7 fL (ref 80.0–100.0)
MPV: 10.9 fL (ref 7.5–12.5)
Platelets: 166 10*3/uL (ref 140–400)
RBC: 3.99 10*6/uL (ref 3.80–5.10)
RDW: 13.1 % (ref 11.0–15.0)
WBC: 6.5 10*3/uL (ref 3.8–10.8)

## 2021-06-18 LAB — COMPLETE METABOLIC PANEL WITH GFR
AG Ratio: 1.5 (calc) (ref 1.0–2.5)
ALT: 12 U/L (ref 6–29)
AST: 22 U/L (ref 10–35)
Albumin: 4.2 g/dL (ref 3.6–5.1)
Alkaline phosphatase (APISO): 76 U/L (ref 37–153)
BUN: 12 mg/dL (ref 7–25)
CO2: 32 mmol/L (ref 20–32)
Calcium: 9.7 mg/dL (ref 8.6–10.4)
Chloride: 100 mmol/L (ref 98–110)
Creat: 0.77 mg/dL (ref 0.50–1.05)
Globulin: 2.8 g/dL (calc) (ref 1.9–3.7)
Glucose, Bld: 92 mg/dL (ref 65–99)
Potassium: 4 mmol/L (ref 3.5–5.3)
Sodium: 140 mmol/L (ref 135–146)
Total Bilirubin: 0.9 mg/dL (ref 0.2–1.2)
Total Protein: 7 g/dL (ref 6.1–8.1)
eGFR: 84 mL/min/{1.73_m2} (ref >=60)

## 2021-06-18 LAB — LIPID PANEL
Cholesterol: 131 mg/dL (ref <200)
HDL: 60 mg/dL (ref >=50)
LDL Cholesterol (Calc): 54 mg/dL (calc)
Non-HDL Cholesterol (Calc): 71 mg/dL (calc) (ref <130)
Total CHOL/HDL Ratio: 2.2 (calc) (ref <5.0)
Triglycerides: 85 mg/dL (ref <150)

## 2021-06-18 LAB — T4, FREE: Free T4: 1.2 ng/dL (ref 0.8–1.8)

## 2021-06-18 LAB — TSH: TSH: 1.52 mIU/L (ref 0.40–4.50)

## 2021-06-18 NOTE — Telephone Encounter (Signed)
Please advise for medication management. 

## 2021-06-20 MED ORDER — PREMARIN 0.625 MG/GM VA CREA: TOPICAL_CREAM | Freq: Every day | VAGINAL | 0 refills | Status: DC

## 2021-06-20 NOTE — Addendum Note (Signed)
Addended by: Jearld Fenton on: 06/20/2021 02:42 PM   Modules accepted: Orders

## 2021-06-21 ENCOUNTER — Encounter: Payer: Self-pay | Admitting: Cardiovascular Disease

## 2021-06-21 DIAGNOSIS — E7800 Pure hypercholesterolemia, unspecified: Secondary | ICD-10-CM

## 2021-06-23 ENCOUNTER — Other Ambulatory Visit: Payer: Self-pay | Admitting: *Deleted

## 2021-06-23 DIAGNOSIS — E7800 Pure hypercholesterolemia, unspecified: Secondary | ICD-10-CM

## 2021-06-23 DIAGNOSIS — I10 Essential (primary) hypertension: Secondary | ICD-10-CM

## 2021-06-23 MED ORDER — ROSUVASTATIN CALCIUM 10 MG PO TABS: 10.0000 mg | ORAL_TABLET | Freq: Every day | ORAL | 3 refills | Status: DC

## 2021-06-23 MED ORDER — APIXABAN 5 MG PO TABS: 5.0000 mg | ORAL_TABLET | Freq: Two times a day (BID) | ORAL | 1 refills | Status: DC

## 2021-06-23 NOTE — Telephone Encounter (Signed)
Eliquis 5 mg refill request received. Patient is 71 years old, weight- 116 kg, Crea- 0.77on 06/17/21, Diagnosis- afib, and last seen by Dr. Mariah Milling on 03/31/21. Dose is appropriate based on dosing criteria. Will send in refill to requested pharmacy.

## 2021-07-11 DIAGNOSIS — F411 Generalized anxiety disorder: Secondary | ICD-10-CM | POA: Diagnosis not present

## 2021-07-14 ENCOUNTER — Ambulatory Visit: Payer: Medicare PPO

## 2021-07-14 ENCOUNTER — Encounter: Payer: Self-pay | Admitting: Emergency Medicine

## 2021-07-14 ENCOUNTER — Other Ambulatory Visit: Payer: Self-pay

## 2021-07-14 ENCOUNTER — Ambulatory Visit: Payer: Self-pay | Admitting: *Deleted

## 2021-07-14 ENCOUNTER — Ambulatory Visit
Admission: EM | Admit: 2021-07-14 | Discharge: 2021-07-14 | Disposition: A | Discharge status: Home / Self Care | Payer: Medicare PPO | Attending: Emergency Medicine | Admitting: Emergency Medicine

## 2021-07-14 ENCOUNTER — Encounter: Payer: Self-pay | Admitting: Internal Medicine

## 2021-07-14 DIAGNOSIS — R1030 Lower abdominal pain, unspecified: Secondary | ICD-10-CM | POA: Insufficient documentation

## 2021-07-14 DIAGNOSIS — R300 Dysuria: Secondary | ICD-10-CM | POA: Insufficient documentation

## 2021-07-14 LAB — POCT URINALYSIS DIP (MANUAL ENTRY)
Bilirubin, UA: NEGATIVE
Glucose, UA: NEGATIVE mg/dL
Ketones, POC UA: NEGATIVE mg/dL
Leukocytes, UA: NEGATIVE
Nitrite, UA: NEGATIVE
Protein Ur, POC: NEGATIVE mg/dL
Spec Grav, UA: 1.015 (ref 1.010–1.025)
Urobilinogen, UA: 0.2 E.U./dL
pH, UA: 7 (ref 5.0–8.0)

## 2021-07-14 NOTE — Telephone Encounter (Signed)
Summary: Possible UTI with SX wanting advice  °  Pt is calling with possible UTI sx - pt tested urine at home and it was positive for leukocytes. Pt is burning, itching, aching in her back. Please advice  °  °  °  °Chief Complaint: UTI sx °Symptoms: burning, chills °Frequency: frequent °Pertinent Negatives: Patient denies no fever °Disposition: []ED /[x]Urgent Care (no appt availability in office) / []Appointment(In office/virtual)/ [] [facility]/ []Home Care/ []Refused Recommended Disposition /[][place] [facility]/ [] Follow-up with PCP °Additional Notes: UC as advised. °  ° Unable to get appt today, she will proceed to UC. °Reason for Disposition ° Urinating more frequently than usual (i.e., frequency) ° °Answer Assessment - Initial Assessment Questions °1. SYMPTOM: "What's the main symptom you're concerned about?" (e.g., frequency, incontinence) °    Burning and chills °2. ONSET: "When did the symptoms  start?" °    2 days °3. PAIN: "Is there any pain?" If Yes, ask: "How bad is it?" (Scale: 1-10; mild, moderate, severe) °    4 °4. CAUSE: "What do you think is causing the symptoms?" °    UTI positive for leukocytes negative for nitrite °5. OTHER SYMPTOMS: "Do you have any other symptoms?" (e.g., fever, flank pain, blood in urine, pain with urination) °    Back pain, aches °6. PREGNANCY: "Is there any chance you are pregnant?" "When was your last menstrual period?" °    na ° °Protocols used: Urinary Symptoms-A-AH ° °  °  ° °

## 2021-07-14 NOTE — ED Provider Notes (Signed)
[facility]    CSN: [id number] Arrival date & time: 07/14/21  1338      History   Chief Complaint Chief Complaint  Patient presents with   Dysuria   Generalized Body Aches    HPI Khailah Boyle is a 71 y.o. female.  Patient presents with 3 day history of dysuria, suprapubic pressure, body aches, chills.  No fever, hematuria, flank pain, vaginal discharge, pelvic pain, or other symptoms.  No treatment at home.  Her medical history includes hypertension.   The history is provided by the patient and medical records.   Past Medical History:  Diagnosis Date   Anxiety 11/25/2015   Atrial septal defect    Chest pain 03/11/2018   Clinical depression 11/25/2015   Gravida 2 para 2 11/25/2015   2.     Heart disease    Hypertension    Thyroid disease     Patient Active Problem List   Diagnosis Date Noted   Mixed hyperlipidemia 12/05/2020   Vaginal atrophy 12/05/2020   Hepatitis C virus infection without hepatic coma 03/11/2018   OSA (obstructive sleep apnea) 11/25/2017   Paroxysmal atrial fibrillation ([place]) 12/01/2016   Adult hypothyroidism 11/25/2015   Essential hypertension 11/25/2015   Morbid obesity ([place])     Past Surgical History:  Procedure Laterality Date   ATRIAL SEPTAL DEFECT(ASD) CLOSURE     CARDIOVERSION N/A 10/13/2017   Procedure: CARDIOVERSION;  Surgeon: Minna Merritts, MD;  Location: [facility] ORS;  Service: Cardiovascular;  Laterality: N/A;   COLONOSCOPY WITH PROPOFOL     2011   EXPLORATION POST OPERATIVE OPEN HEART      OB History   No obstetric history on file.      Home Medications    Prior to Admission medications   Medication Sig Start Date End Date Taking? Authorizing Provider  apixaban (ELIQUIS) 5 MG TABS tablet Take 1 tablet (5 mg total) by mouth 2 (two) times daily. 06/23/21   Minna Merritts, MD  celecoxib (CELEBREX) 100 MG capsule Take 1 capsule (100 mg total) by mouth 2 (two) times daily. 06/16/21   Jearld Fenton, NP  cholecalciferol  (VITAMIN D) 1000 units tablet Take 1 tablet (1,000 Units total) by mouth daily. Patient taking differently: Take 2,000 Units by mouth daily. 11/10/17   Mikey College, NP  conjugated estrogens (PREMARIN) vaginal cream Place vaginally at bedtime. 06/20/21   Jearld Fenton, NP  Cranberry 125 MG TABS Take by mouth.    [provider]  diphenhydrAMINE (BENADRYL) 12.5 MG chewable tablet Chew 12.5 mg by mouth as needed for sleep.    [provider]  hydrochlorothiazide (MICROZIDE) 12.5 MG capsule Take 1 capsule (12.5 mg total) by mouth daily. 03/31/21   Minna Merritts, MD  levothyroxine (SYNTHROID) 50 MCG tablet TAKE 1 TABLET BY MOUTH BEFORE BREAKFAST 06/16/21   Jearld Fenton, NP  Melatonin-Pyridoxine 5-10 MG TBCR Take by mouth.    [provider]  metoprolol succinate (TOPROL-XL) 25 MG 24 hr tablet TAKE 1 TABLET BY MOUTH TWICE DAILY. TAKEWITH OR IMMEDIATELY FOLLOWING A MEAL 03/31/21   Minna Merritts, MD  omeprazole (PRILOSEC) 20 MG capsule Take 20 mg by mouth daily. 06/16/21   [provider]  rosuvastatin (CRESTOR) 10 MG tablet Take 1 tablet (10 mg total) by mouth daily. 06/23/21   Minna Merritts, MD    Family History Family History  Problem Relation Age of Onset   Cancer Mother  melanoma   Heart disease Father    Heart attack Father    Breast cancer Maternal Grandmother        Breast Cancer    Social History Social History   Tobacco Use   Smoking status: Former   Smokeless tobacco: Former  Scientific laboratory technician Use: Never used  Substance Use Topics   Alcohol use: No   Drug use: No     Allergies   Patient has no known allergies.   Review of Systems Review of Systems  Constitutional:  Negative for chills and fever.  Gastrointestinal:  Positive for abdominal pain. Negative for diarrhea and vomiting.  Genitourinary:  Positive for dysuria. Negative for flank pain, hematuria, pelvic pain and vaginal discharge.  Skin:  Negative  for color change and rash.  Neurological:  Negative for syncope.  All other systems reviewed and are negative.   Physical Exam Triage Vital Signs ED Triage Vitals  Enc Vitals Group     BP      Pulse      Resp      Temp      Temp src      SpO2      Weight      Height      Head Circumference      Peak Flow      Pain Score      Pain Loc      Pain Edu?      Excl. in [place]?    No data found.  Updated Vital Signs BP 128/84 (BP Location: Left Arm)    Pulse 87    Temp 98.2 F (36.8 C)    Resp 18    SpO2 98%   Visual Acuity Right Eye Distance:   Left Eye Distance:   Bilateral Distance:    Right Eye Near:   Left Eye Near:    Bilateral Near:     Physical Exam Vitals and nursing note reviewed.  Constitutional:      General: She is not in acute distress.    Appearance: She is well-developed. She is obese. She is not ill-appearing.  HENT:     Mouth/Throat:     Mouth: Mucous membranes are moist.  Eyes:     Conjunctiva/sclera: Conjunctivae normal.  Cardiovascular:     Rate and Rhythm: Normal rate and regular rhythm.     Heart sounds: Normal heart sounds.  Pulmonary:     Effort: Pulmonary effort is normal. No respiratory distress.     Breath sounds: Normal breath sounds.  Abdominal:     General: Bowel sounds are normal. There is no distension.     Palpations: Abdomen is soft.     Tenderness: There is no abdominal tenderness. There is no right CVA tenderness, left CVA tenderness, guarding or rebound.  Musculoskeletal:     Cervical back: Neck supple.  Skin:    General: Skin is warm and dry.  Neurological:     Mental Status: She is alert.  Psychiatric:        Mood and Affect: Mood normal.        Behavior: Behavior normal.     UC Treatments / Results  Labs (all labs ordered are listed, but only abnormal results are displayed) Labs Reviewed  POCT URINALYSIS DIP (MANUAL ENTRY) - Abnormal; Notable for the following components:      Result Value   Blood, UA  trace-intact (*)    All other components within normal limits  URINE  CULTURE    EKG   Radiology No results found.  Procedures Procedures (including critical care time)  Medications Ordered in UC Medications - No data to display  Initial Impression / Assessment and Plan / UC Course  I have reviewed the triage vital signs and the nursing notes.  Pertinent labs & imaging results that were available during my care of the patient were reviewed by me and considered in my medical decision making (see chart for details).    Dysuria, suprapubic abdominal pain.  Urine positive for trace blood but negative for leukocytes and nitrites.  Per patient request, sending for urine culture.  Discussed with patient that we will call her if the urine culture shows the need for treatment. Instructed her to follow-up with her PCP if her symptoms are not improving. Patient agrees to plan of care.     Final Clinical Impressions(s) / UC Diagnoses   Final diagnoses:  Dysuria  Lower abdominal pain     Discharge Instructions      The urine culture is pending.  We will call you if it shows the need for treatment.  Follow up with your primary care provider if your symptoms are not improving.          ED Prescriptions   None    PDMP not reviewed this encounter.   Sharion Balloon, NP 07/14/21 1544

## 2021-07-14 NOTE — Telephone Encounter (Deleted)
Summary: Possible UTI with SX wanting advice   Pt is calling with possible UTI sx - pt tested urine at home and it was positive for leukocytes. Pt is burning, itching, aching in her back. Please advice      Chief Complaint: UTI sx Symptoms: burning, chills Frequency: frequent Pertinent Negatives: Patient denies no fever Disposition: [] ED /[] Urgent Care (no appt availability in office) / [x] Appointment(In office/virtual)/ []  [place] [facility]/ [] Home Care/ [] Refused Recommended Disposition /[] [place] [facility]/ []  Follow-up with PCP Additional Notes: Pt wants to come give specimen today since there was no appt until Wednesday. If pt can not do this she will go to urgent care today.   Reason for Disposition  Urinating more frequently than usual (i.e., frequency)  Answer Assessment - Initial Assessment Questions 1. SYMPTOM: "What's the main symptom you're concerned about?" (e.g., frequency, incontinence)     Burning and chills 2. ONSET: "When did the symptoms  start?"     2 days 3. PAIN: "Is there any pain?" If Yes, ask: "How bad is it?" (Scale: 1-10; mild, moderate, severe)     4 4. CAUSE: "What do you think is causing the symptoms?"     UTI positive for leukocytes negative for nitrite 5. OTHER SYMPTOMS: "Do you have any other symptoms?" (e.g., fever, flank pain, blood in urine, pain with urination)     Back pain, aches 6. PREGNANCY: "Is there any chance you are pregnant?" "When was your last menstrual period?"     na  Protocols used: Urinary Symptoms-A-AH

## 2021-07-14 NOTE — Discharge Instructions (Addendum)
The urine culture is pending.  We will call you if it shows the need for treatment.  Follow up with your primary care provider if your symptoms are not improving.     

## 2021-07-14 NOTE — ED Triage Notes (Signed)
Pt presents with dysuria, chills and aches x 3 days

## 2021-07-14 NOTE — Telephone Encounter (Signed)
Summary: Possible UTI with SX wanting advice    Pt is calling with possible UTI sx - pt tested urine at home and it was positive for leukocytes. Pt is burning, itching, aching in her back. Please advice        Chief Complaint: UTI sx Symptoms: burning, chills Frequency: frequent Pertinent Negatives: Patient denies no fever Disposition: [] ED /[x] Urgent Care (no appt availability in office) / [] Appointment(In office/virtual)/ []  [place] [facility]/ [] Home Care/ [] Refused Recommended Disposition /[] [facility]/ []  Follow-up with PCP Additional Notes: UC as advised.    Unable to get appt today, she will proceed to UC. Reason for Disposition  Urinating more frequently than usual (i.e., frequency)  Answer Assessment - Initial Assessment Questions 1. SYMPTOM: "What's the main symptom you're concerned about?" (e.g., frequency, incontinence)     Burning and chills 2. ONSET: "When did the symptoms  start?"     2 days 3. PAIN: "Is there any pain?" If Yes, ask: "How bad is it?" (Scale: 1-10; mild, moderate, severe)     4 4. CAUSE: "What do you think is causing the symptoms?"     UTI positive for leukocytes negative for nitrite 5. OTHER SYMPTOMS: "Do you have any other symptoms?" (e.g., fever, flank pain, blood in urine, pain with urination)     Back pain, aches 6. PREGNANCY: "Is there any chance you are pregnant?" "When was your last menstrual period?"     na  Protocols used: Urinary Symptoms-A-AH

## 2021-07-16 ENCOUNTER — Telehealth: Payer: Medicare PPO | Admitting: Family Medicine

## 2021-07-17 ENCOUNTER — Telehealth (HOSPITAL_COMMUNITY): Payer: Self-pay | Admitting: Emergency Medicine

## 2021-07-17 LAB — URINE CULTURE: Culture: 20000 — AB

## 2021-07-17 MED ORDER — CEPHALEXIN 500 MG PO CAPS: 500.0000 mg | ORAL_CAPSULE | Freq: Two times a day (BID) | ORAL | 0 refills | Status: AC

## 2021-07-22 ENCOUNTER — Other Ambulatory Visit: Payer: Self-pay | Admitting: Internal Medicine

## 2021-07-22 DIAGNOSIS — N952 Postmenopausal atrophic vaginitis: Secondary | ICD-10-CM

## 2021-07-23 NOTE — Telephone Encounter (Signed)
Requested Prescriptions  Pending Prescriptions Disp Refills   PREMARIN vaginal cream [Pharmacy Med Name: PREMARIN VAGINAL CREAM 30GM] 30 g 0    Sig: PLACE VAGINALLY AT BEDTIME     OB/GYN:  Estrogens Passed - 07/22/2021 12:11 PM      Passed - Mammogram is up-to-date per Health Maintenance      Passed - Last BP in normal range    BP Readings from Last 1 Encounters:  07/14/21 128/84         Passed - Valid encounter within last 12 months    Recent Outpatient Visits          1 month ago Encounter for general adult medical examination with abnormal findings   [facility], Salvadore Oxford, NP   7 months ago Chronic hepatitis C without hepatic coma [facility])   [facility], Salvadore Oxford, NP   1 year ago Essential hypertension   [facility], Jodelle Gross, FNP   2 years ago Encounter for [employer] annual wellness exam   [facility] Galen Manila, NP   2 years ago Erroneous encounter - disregard   [facility] Galen Manila, NP      Future Appointments            In 4 months Baity, Salvadore Oxford, NP [facility], [facility]

## 2021-08-22 DIAGNOSIS — F411 Generalized anxiety disorder: Secondary | ICD-10-CM | POA: Diagnosis not present

## 2021-08-23 ENCOUNTER — Encounter: Payer: Self-pay | Admitting: Internal Medicine

## 2021-08-23 DIAGNOSIS — E039 Hypothyroidism, unspecified: Secondary | ICD-10-CM

## 2021-08-25 MED ORDER — HYDROCHLOROTHIAZIDE 12.5 MG PO CAPS: 12.5000 mg | ORAL_CAPSULE | Freq: Every day | ORAL | 1 refills | Status: DC

## 2021-08-25 MED ORDER — LEVOTHYROXINE SODIUM 50 MCG PO TABS: ORAL_TABLET | ORAL | 1 refills | Status: DC

## 2021-08-30 ENCOUNTER — Encounter: Payer: Self-pay | Admitting: Internal Medicine

## 2021-09-09 ENCOUNTER — Encounter: Payer: Self-pay | Admitting: Internal Medicine

## 2021-09-09 ENCOUNTER — Ambulatory Visit: Payer: Medicare PPO | Admitting: Internal Medicine

## 2021-09-09 VITALS — BP 124/74 | HR 82 | Temp 97.3°F | Wt 255.0 lb

## 2021-09-09 DIAGNOSIS — Z6839 Body mass index (BMI) 39.0-39.9, adult: Secondary | ICD-10-CM | POA: Diagnosis not present

## 2021-09-09 DIAGNOSIS — M2550 Pain in unspecified joint: Secondary | ICD-10-CM | POA: Diagnosis not present

## 2021-09-09 DIAGNOSIS — Z832 Family history of diseases of the blood and blood-forming organs and certain disorders involving the immune mechanism: Secondary | ICD-10-CM

## 2021-09-09 NOTE — Assessment & Plan Note (Signed)
Encouraged weight loss as this can help reduce joint pain 

## 2021-09-09 NOTE — Patient Instructions (Signed)
Anti-DNA Antibody Test ?Why am I having this test? ?The anti-DNA antibody test helps with the diagnosis and follow-up of systemic lupus erythematosus (SLE). It is also used to monitor treatment of this condition as the antibody decreases with successful therapy. ?What is being tested? ?This test measures the amount of anti-DNA antibody in the blood. This antibody is found in 65-80% of patients with active SLE. This antibody is not as common in patients who have other diseases. ?What kind of sample is taken? ?A blood sample is required for this test. It is usually collected by inserting a needle into a blood vessel. ?How are the results reported? ?Your test results will be reported as a value. Your test results may also be reported as positive, intermediate, or negative. Your health care provider will compare your results to normal ranges that were established after testing a large group of people (reference values). Reference values may vary among labs and hospitals. For this test, common reference values are: ?Positive: 10 or more international units/mL. ?Intermediate: 5-9 international units/mL. ?Negative: Less than 5 international units/mL. ?What do the results mean? ?Positive results, which are associated with results that are higher than the reference values, may indicate: ?Autoimmune disorders such as SLE. ?Infectious mononucleosis. ?Chronic liver conditions. ?Intermediate results mean that the anti-DNA antibody levels are higher than normal, but not high enough to be considered positive. ?Negative results mean that you do not have the anti-DNA antibody that is associated with these conditions. ?Talk with your health care provider about what your results mean. ?Questions to ask your health care provider ?Ask your health care provider, or the department that is doing the test: ?When will my results be ready? ?How will I get my results? ?What are my treatment options? ?What other tests do I need? ?What are my  next steps? ?Summary ?The anti-DNA antibody test helps with the diagnosis and follow-up of systemic lupus erythematosus (SLE). It is also used to monitor treatment of this condition as the antibody decreases with successful therapy. ?This test measures the amount of anti-DNA antibody in the blood. ?Elevated levels of anti-DNA antibody can be seen in patients with SLE and certain other conditions. ?This information is not intended to replace advice given to you by your health care provider. Make sure you discuss any questions you have with your health care provider. ?Document Revised: 12/04/2020 Document Reviewed: 01/26/2020 ?Elsevier Patient Education ? 2022 Elsevier Inc. ? ?

## 2021-09-09 NOTE — Progress Notes (Signed)
? ?Subjective:  ? ? Patient ID: Meghan Cole, female    DOB: 25-Oct-1950, 71 y.o.   MRN: [id number] ? ?HPI ? ?Pt presents to the clinic today with c/o multiple joint pain. This started 2 years ago, but has been intermittent. She describes the pain as squeezing, burning, tingling and numbness. She reports it seems like all of her joints hurt. She denies joint swelling, redness or warmth. She is taking Celebrex and CBD balm with some relief of symptoms. She has RA and gout in her family. She is requesting referral to rheumatology for further evaluation. ? ?She would also like a handicap placard. ? ?Review of Systems ? ?   ?Past Medical History:  ?Diagnosis Date  ? Anxiety 11/25/2015  ? Atrial septal defect   ? Chest pain 03/11/2018  ? Clinical depression 11/25/2015  ? Gravida 2 para 2 11/25/2015  ? 2.    ? Heart disease   ? Hypertension   ? Thyroid disease   ? ? ?Current Outpatient Medications  ?Medication Sig Dispense Refill  ? apixaban (ELIQUIS) 5 MG TABS tablet Take 1 tablet (5 mg total) by mouth 2 (two) times daily. 180 tablet 1  ? celecoxib (CELEBREX) 100 MG capsule Take 1 capsule (100 mg total) by mouth 2 (two) times daily. 180 capsule 1  ? cholecalciferol (VITAMIN D) 1000 units tablet Take 1 tablet (1,000 Units total) by mouth daily. (Patient taking differently: Take 2,000 Units by mouth daily.)    ? Cranberry 125 MG TABS Take by mouth.    ? diphenhydrAMINE (BENADRYL) 12.5 MG chewable tablet Chew 12.5 mg by mouth as needed for sleep.    ? hydrochlorothiazide (MICROZIDE) 12.5 MG capsule Take 1 capsule (12.5 mg total) by mouth daily. 90 capsule 1  ? levothyroxine (SYNTHROID) 50 MCG tablet TAKE 1 TABLET BY MOUTH BEFORE BREAKFAST 90 tablet 1  ? Melatonin-Pyridoxine 5-10 MG TBCR Take by mouth.    ? metoprolol succinate (TOPROL-XL) 25 MG 24 hr tablet TAKE 1 TABLET BY MOUTH TWICE DAILY. TAKEWITH OR IMMEDIATELY FOLLOWING A MEAL 180 tablet 3  ? omeprazole (PRILOSEC) 20 MG capsule Take 20 mg by mouth daily.    ? PREMARIN  vaginal cream PLACE VAGINALLY AT BEDTIME 30 g 2  ? rosuvastatin (CRESTOR) 10 MG tablet Take 1 tablet (10 mg total) by mouth daily. 90 tablet 3  ? ?No current facility-administered medications for this visit.  ? ? ?No Known Allergies ? ?Family History  ?Problem Relation Age of Onset  ? Cancer Mother   ?     melanoma  ? Heart disease Father   ? Heart attack Father   ? Breast cancer Maternal Grandmother   ?     Breast Cancer  ? ? ?Social History  ? ?Socioeconomic History  ? Marital status: Married  ?  Spouse name: Wade Sigala  ? Number of children: Not on file  ? Years of education: RN  ? Highest education level: Not on file  ?Occupational History  ? Occupation: Actor)  ?  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 Smoking status: Former  ? Smokeless tobacco: Former  ?Vaping Use  ? Vaping Use: Never used  ?Substance and Sexual Activity  ? Alcohol use: No  ? Drug use: No  ? Sexual activity: Not on file  ?Other Topics Concern  ? Not on file  ?Social History Narrative  ? Not on file  ? ?Social Determinants of Health  ? ?  Financial Resource Strain: Not on file  ?Food Insecurity: Not on file  ?Transportation Needs: Not on file  ?Physical Activity: Not on file  ?Stress: Not on file  ?Social Connections: Not on file  ?Intimate Partner Violence: Not on file  ? ? ? ?Constitutional: Denies fever, malaise, fatigue, headache or abrupt weight changes.  ?Respiratory: Denies difficulty breathing, shortness of breath, cough or sputum production.   ?Cardiovascular: Denies chest pain, chest tightness, palpitations or swelling in the hands or feet.  ?Musculoskeletal: Pt reports multiple joint pain, difficulty walking long distances. Denies decrease in range of motion, muscle pain or joint swelling.  ?Skin: Denies redness, rashes, lesions or ulcercations.  ?Neurological: Pt reports numbness, tingling, weakness, burning. Denies dizziness, difficulty with memory, difficulty with speech  or problems with balance and coordination.  ? ? ?No other specific complaints in a complete review of systems (except as listed in HPI above). ? ?Objective:  ? Physical Exam ?BP 124/74 (BP Location: Left Arm, Patient Position: Sitting, Cuff Size: Large)   Pulse 82   Temp (!) 97.3 ?F (36.3 ?C) (Temporal)   Wt 255 lb (115.7 kg)   SpO2 99%   BMI 39.94 kg/m?  ? ?Wt Readings from Last 3 Encounters:  ?06/16/21 255 lb 12.8 oz (116 kg)  ?03/31/21 253 lb 2 oz (114.8 kg)  ?12/05/20 253 lb (114.8 kg)  ? ? ?General: Appears her stated age, obese, in NAD. ?Skin: Warm, dry and intact. No rashes noted. ?Cardiovascular: Normal rate and rhythm. S1,S2 noted.  No murmur, rubs or gallops noted. No JVD or BLE edema. ?Pulmonary/Chest: Normal effort and positive vesicular breath sounds. No respiratory distress. No wheezes, rales or ronchi noted.  ?Musculoskeletal: Strength 5/5 BUE/BLE. No pinpoint muscle tenderness noted. No signs of joint swelling. She has difficulty getting from a sitting to a standing position. ?Neurological: Alert and oriented.  ? ? ?BMET ?   ?Component Value Date/Time  ? NA 140 06/17/2021 0831  ? NA 139 10/06/2019 0905  ? K 4.0 06/17/2021 0831  ? CL 100 06/17/2021 0831  ? CO2 32 06/17/2021 0831  ? GLUCOSE 92 06/17/2021 0831  ? BUN 12 06/17/2021 0831  ? BUN 10 10/06/2019 0905  ? CREATININE 0.77 06/17/2021 0831  ? CALCIUM 9.7 06/17/2021 0831  ? GFRNONAA 91 12/06/2020 0742  ? GFRAA 105 12/06/2020 0742  ? ? ?Lipid Panel  ?   ?Component Value Date/Time  ? CHOL 131 06/17/2021 0831  ? CHOL 140 10/06/2019 0905  ? TRIG 85 06/17/2021 0831  ? HDL 60 06/17/2021 0831  ? HDL 70 10/06/2019 0905  ? CHOLHDL 2.2 06/17/2021 0831  ? [place] 54 06/17/2021 0831  ? ? ?CBC ?   ?Component Value Date/Time  ? WBC 6.5 06/17/2021 0831  ? RBC 3.99 06/17/2021 0831  ? HGB 12.3 06/17/2021 0831  ? HGB 12.6 10/06/2019 0905  ? HCT 37.8 06/17/2021 0831  ? HCT 37.6 10/06/2019 0905  ? PLT 166 06/17/2021 0831  ? PLT 185 10/06/2019 0905  ? MCV 94.7  06/17/2021 0831  ? MCV 91 10/06/2019 0905  ? MCH 30.8 06/17/2021 0831  ? MCHC 32.5 06/17/2021 0831  ? RDW 13.1 06/17/2021 0831  ? RDW 13.2 10/06/2019 0905  ? LYMPHSABS 1.5 10/06/2019 0905  ? EOSABS 0.1 10/06/2019 0905  ? BASOSABS 0.1 10/06/2019 0905  ? ? ?Hgb A1C ?Lab Results  ?Component Value Date  ? HGBA1C 5.4 12/06/2020  ? ? ? ? ? ? ? ?   ?Assessment & Plan:  ? ?Multiple  Joint Pain, Family History of Autoimmune Disease: ? ?Will check ESR, CRP, RF and ANA today ?Continue Celebrex and CBD balm as previous prescribed ?Consider referral to rheumatology pending labs ? ?RTC in 3 months for follow up of chronic conditions ?Webb Silversmith, NP ? ?

## 2021-09-11 ENCOUNTER — Encounter: Payer: Self-pay | Admitting: Internal Medicine

## 2021-09-11 DIAGNOSIS — G8929 Other chronic pain: Secondary | ICD-10-CM

## 2021-09-11 LAB — SEDIMENTATION RATE: Sed Rate: 17 mm/h (ref 0–30)

## 2021-09-11 LAB — C-REACTIVE PROTEIN: CRP: 1.9 mg/L (ref <8.0)

## 2021-09-11 LAB — RHEUMATOID FACTOR: Rheumatoid fact SerPl-aCnc: <14 IU/mL (ref <14)

## 2021-09-11 LAB — ANA: Anti Nuclear Antibody (ANA): NEGATIVE

## 2021-09-15 ENCOUNTER — Ambulatory Visit
Admission: RE | Admit: 2021-09-15 | Discharge: 2021-09-15 | Disposition: A | Discharge status: Home / Self Care | Payer: Medicare PPO | Source: Home / Self Care | Attending: Internal Medicine | Admitting: Internal Medicine

## 2021-09-15 ENCOUNTER — Encounter: Payer: Self-pay | Admitting: Internal Medicine

## 2021-09-15 ENCOUNTER — Ambulatory Visit
Admission: RE | Admit: 2021-09-15 | Discharge: 2021-09-15 | Disposition: A | Discharge status: Home / Self Care | Payer: Medicare PPO | Source: Ambulatory Visit | Attending: Internal Medicine | Admitting: Internal Medicine

## 2021-09-15 DIAGNOSIS — M25562 Pain in left knee: Secondary | ICD-10-CM | POA: Diagnosis not present

## 2021-09-15 DIAGNOSIS — M1712 Unilateral primary osteoarthritis, left knee: Secondary | ICD-10-CM | POA: Diagnosis not present

## 2021-09-15 DIAGNOSIS — M25462 Effusion, left knee: Secondary | ICD-10-CM | POA: Diagnosis not present

## 2021-09-15 DIAGNOSIS — G8929 Other chronic pain: Secondary | ICD-10-CM | POA: Insufficient documentation

## 2021-09-26 DIAGNOSIS — F411 Generalized anxiety disorder: Secondary | ICD-10-CM | POA: Diagnosis not present

## 2021-09-30 ENCOUNTER — Other Ambulatory Visit: Payer: Self-pay

## 2021-09-30 ENCOUNTER — Encounter: Payer: Self-pay | Admitting: Internal Medicine

## 2021-09-30 MED ORDER — OMEPRAZOLE 20 MG PO CPDR: 20.0000 mg | DELAYED_RELEASE_CAPSULE | Freq: Every day | ORAL | 3 refills | Status: DC

## 2021-10-01 ENCOUNTER — Telehealth: Payer: Self-pay | Admitting: Internal Medicine

## 2021-10-01 NOTE — Telephone Encounter (Signed)
Left message for patient to call back and schedule Medicare Annual Wellness Visit (AWV) to be done virtually or by telephone. ? ?No hx of AWV eligible as of  02/15/20 ? ?Please schedule at anytime with [facility] Health Advisor.     ? ?45 Minutes appointment  ? ?Any questions, please call me at [phone]  ?

## 2021-10-02 NOTE — Telephone Encounter (Signed)
Pt has declined a AWV.  She states she will "pass" this year. ?

## 2021-11-10 DIAGNOSIS — L718 Other rosacea: Secondary | ICD-10-CM | POA: Diagnosis not present

## 2021-11-10 DIAGNOSIS — Z86018 Personal history of other benign neoplasm: Secondary | ICD-10-CM | POA: Diagnosis not present

## 2021-11-10 DIAGNOSIS — L578 Other skin changes due to chronic exposure to nonionizing radiation: Secondary | ICD-10-CM | POA: Diagnosis not present

## 2021-11-10 DIAGNOSIS — Z872 Personal history of diseases of the skin and subcutaneous tissue: Secondary | ICD-10-CM | POA: Diagnosis not present

## 2021-11-14 DIAGNOSIS — F411 Generalized anxiety disorder: Secondary | ICD-10-CM | POA: Diagnosis not present

## 2021-11-15 ENCOUNTER — Encounter: Payer: Self-pay | Admitting: Internal Medicine

## 2021-11-17 MED ORDER — HYDROCHLOROTHIAZIDE 25 MG PO TABS: 25.0000 mg | ORAL_TABLET | Freq: Every day | ORAL | 1 refills | Status: DC

## 2021-11-19 ENCOUNTER — Other Ambulatory Visit: Payer: Self-pay | Admitting: Cardiovascular Disease

## 2021-11-19 NOTE — Telephone Encounter (Signed)
Refill request

## 2021-11-19 NOTE — Telephone Encounter (Signed)
Pt last saw Dr Mariah Milling 03/31/21, last labs 06/17/21 Creat 0.77, age 70, weight 115.7kg, based on specified criteria pt is on appropriate dosage of Eliquis 5mg  BID for afib.  Will refill rx.

## 2021-11-25 ENCOUNTER — Ambulatory Visit (INDEPENDENT_AMBULATORY_CARE_PROVIDER_SITE_OTHER): Payer: Medicare PPO | Admitting: Internal Medicine

## 2021-11-25 VITALS — BP 123/67 | HR 68 | Temp 97.9°F | Resp 16 | Ht 67.0 in | Wt 216.0 lb

## 2021-11-25 DIAGNOSIS — I10 Essential (primary) hypertension: Secondary | ICD-10-CM

## 2021-11-25 DIAGNOSIS — Z9989 Dependence on other enabling machines and devices: Secondary | ICD-10-CM

## 2021-11-25 DIAGNOSIS — G4733 Obstructive sleep apnea (adult) (pediatric): Secondary | ICD-10-CM | POA: Diagnosis not present

## 2021-11-25 DIAGNOSIS — I480 Paroxysmal atrial fibrillation: Secondary | ICD-10-CM | POA: Diagnosis not present

## 2021-11-25 DIAGNOSIS — E669 Obesity, unspecified: Secondary | ICD-10-CM | POA: Diagnosis not present

## 2021-11-25 DIAGNOSIS — E039 Hypothyroidism, unspecified: Secondary | ICD-10-CM

## 2021-11-25 NOTE — Progress Notes (Unsigned)
[facility] [place], [place] [address]  Pulmonary Sleep Medicine   Office Visit Note  Patient Name: Meghan Cole DOB: 05-Apr-1951 MRN [id number]  I connected with  Amanat Hackel on 11/25/21 by a video enabled telemedicine application and verified that I am speaking with the correct person using two identifiers.   I discussed the limitations of evaluation and management by telemedicine. The patient expressed understanding and agreed to proceed.   Chief Complaint: Obstructive Sleep Apnea visit  Brief History:  Phyllistine is seen today for an annual follow up on CPAP_0 .  The patient has a 4 year  history of sleep apnea. Patient is using PAP nightly.  The patient feels rested after sleeping with PAP.  The patient reports benefit from PAP use. Reported sleepiness is  improved and the Epworth Sleepiness Score is 9 out of 24. The patient does take naps. The patient complains of the following: none.  The compliance download shows 100% compliance with an average use time of 7 hours 23 min The AHI is 5.0  The patient does not complain of limb movements disrupting sleep.  ROS  General: (-) fever, (-) chills, (-) night sweat Nose and Sinuses: (-) nasal stuffiness or itchiness, (-) postnasal drip, (-) nosebleeds, (-) sinus trouble. Mouth and Throat: (-) sore throat, (-) hoarseness. Neck: (-) swollen glands, (-) enlarged thyroid, (-) neck pain. Respiratory: - cough, - shortness of breath, - wheezing. Neurologic: - numbness, - tingling. Psychiatric: - anxiety, - depression   Current Medication: Outpatient Encounter Medications as of 11/25/2021  Medication Sig   celecoxib (CELEBREX) 100 MG capsule Take 1 capsule (100 mg total) by mouth 2 (two) times daily.   cholecalciferol (VITAMIN D) 1000 units tablet Take 1 tablet (1,000 Units total) by mouth daily. (Patient taking differently: Take 2,000 Units by mouth daily.)   Cranberry 125 MG TABS Take by mouth.   diphenhydrAMINE  (BENADRYL) 12.5 MG chewable tablet Chew 12.5 mg by mouth as needed for sleep.   ELIQUIS 5 MG TABS tablet TAKE 1 TABLET(5 MG) BY MOUTH TWICE DAILY   hydrochlorothiazide (HYDRODIURIL) 25 MG tablet Take 1 tablet (25 mg total) by mouth daily.   levothyroxine (SYNTHROID) 50 MCG tablet TAKE 1 TABLET BY MOUTH BEFORE BREAKFAST   Melatonin-Pyridoxine 5-10 MG TBCR Take by mouth.   metoprolol succinate (TOPROL-XL) 25 MG 24 hr tablet TAKE 1 TABLET BY MOUTH TWICE DAILY. TAKEWITH OR IMMEDIATELY FOLLOWING A MEAL   omeprazole (PRILOSEC) 20 MG capsule Take 1 capsule (20 mg total) by mouth daily.   PREMARIN vaginal cream PLACE VAGINALLY AT BEDTIME   rosuvastatin (CRESTOR) 10 MG tablet Take 1 tablet (10 mg total) by mouth daily.   No facility-administered encounter medications on file as of 11/25/2021.    Surgical History: Past Surgical History:  Procedure Laterality Date   ATRIAL SEPTAL DEFECT(ASD) CLOSURE     CARDIOVERSION N/A 10/13/2017   Procedure: CARDIOVERSION;  Surgeon: Minna Merritts, MD;  Location: [facility] ORS;  Service: Cardiovascular;  Laterality: N/A;   COLONOSCOPY WITH PROPOFOL     2011   EXPLORATION POST OPERATIVE OPEN HEART      Medical History: Past Medical History:  Diagnosis Date   Anxiety 11/25/2015   Atrial septal defect    Chest pain 03/11/2018   Clinical depression 11/25/2015   Gravida 2 para 2 11/25/2015   2.     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)  Tobacco Use   Smoking status: Former   Smokeless tobacco: Former  Scientific laboratory technician Use: Never used  Substance  and Sexual Activity   Alcohol use: No   Drug use: No   Sexual activity: Not on file  Other Topics Concern   Not on file  Social History Narrative   Not on file   Social Determinants of Health   Financial Resource Strain: Not on file  Food Insecurity: Not on file  Transportation Needs: Not on file  Physical Activity: Not on file  Stress: Not on file  Social Connections: Not on file  Intimate Partner Violence: Not on file    Vital Signs: Blood pressure 123/67, pulse 68, temperature 97.9 F (36.6 C), resp. rate 16, height _0  (1.702 m), weight 216 lb (98 kg), SpO2 98 %. Body mass index is 33.83 kg/m.     Examination: General Appearance: The patient is well-developed, well-nourished, and in no distress. Neck Circumference:  Skin: Gross inspection of skin unremarkable. Head: normocephalic, no gross deformities. Eyes: no gross deformities noted. ENT: ears appear grossly normal Neurologic: Alert and oriented. No involuntary movements.    EPWORTH SLEEPINESS SCALE:  Scale:  (0)= no chance of dozing; (1)= slight chance of dozing; (2)= moderate chance of dozing; (3)= high chance of dozing  Chance  Situtation    Sitting and reading: 3    Watching TV: 0    Sitting Inactive in public: 0    As a passenger in car: 0      Lying down to rest: 3    Sitting and talking: 0    Sitting quielty after lunch: 3    In a car, stopped in traffic: 0   TOTAL SCORE:   9 out of 24    SLEEP STUDIES:  PSG (04/2018) AHI 21 hr, Supine REM 56/hr, min Sp02 70% Titration (04/2018) CPAP_1    CPAP COMPLIANCE DATA:  Date Range: 11/24/2020-11/23/2021  Average Daily Use: 7 hours 23 min  Median Use: 7 hrs 29 min  Compliance for > 4 Hours: 100% days  AHI: 5.0 respiratory events per hour  Days Used: 365/365  Mask Leak: 7.4  95th Percentile Pressure: 5         LABS: Recent Results (from the past 2160 hour(s))  Sed Rate (ESR)     Status: None   Collection Time:  09/09/21  3:17 PM  Result Value Ref Range   Sed Rate 17 0 - 30 mm/h  C-reactive protein     Status: None   Collection Time: 09/09/21  3:17 PM  Result Value Ref Range   CRP 1.9 <8.0 mg/L  Rheumatoid Factor     Status: None   Collection Time: 09/09/21  3:17 PM  Result Value Ref Range   Rhuematoid fact SerPl-aCnc <14 <14 IU/mL  Antinuclear Antib (ANA)     Status: None   Collection Time: 09/09/21  3:17 PM  Result Value Ref Range   Anti Nuclear Antibody (ANA) NEGATIVE NEGATIVE    Comment: ANA IFA is a first line screen for detecting the presence of up to  approximately 150 autoantibodies in various autoimmune diseases. A negative ANA IFA result suggests an ANA-associated autoimmune disease is not present at this time, but is not definitive. If there is high clinical suspicion for Sjogren's syndrome, testing for anti-SS-A/Ro antibody should be considered. Anti-Jo-1 antibody should be considered for clinically suspected inflammatory myopathies. . AC-0: Negative . International Consensus on ANA Patterns (https://www.hernandez-brewer.com/) . For additional information, please refer to http://education.[employer].com/faq/FAQ177 (This link is being provided for informational/ educational purposes only.) .     Radiology: DG Knee Complete 4 Views Left  Result Date: 09/15/2021 CLINICAL DATA:  Chronic pain EXAM: LEFT KNEE - COMPLETE 4+ VIEW COMPARISON:  None. FINDINGS: No fracture or dislocation is seen. Small bony spurs seen in the medial and patellofemoral compartments. There is narrowing of joint space in the medial compartment. There is small effusion in the suprapatellar bursa. IMPRESSION: No recent fracture or dislocation is seen in the left knee. Degenerative changes are noted with bony spurs in the medial and patellofemoral compartments. Small effusion is present in the suprapatellar bursa. Electronically Signed   By: Elmer Picker M.D.   On: 09/15/2021 14:09     No results found.  No results found.    Assessment and Plan: Patient Active Problem List   Diagnosis Date Noted   Mixed hyperlipidemia 12/05/2020   Vaginal atrophy 12/05/2020   Hepatitis C virus infection without hepatic coma 03/11/2018   OSA (obstructive sleep apnea) 11/25/2017   Paroxysmal atrial fibrillation ([place]) 12/01/2016   Adult hypothyroidism 11/25/2015   Essential hypertension 11/25/2015   Class 2 obesity due to excess calories with body mass index (BMI) of 39.0 to 39.9 in adult       The patient does tolerate PAP and reports benefit from PAP use. The patient was reminded how to adjust mask fit and advised to change supplies regularly. The patient was also counselled on nightly use. The compliance is excellent. The AHI is 5.0. Since AHI borderline will increases pressure to 6cm H2O and check remote download in 2 weeks. Patient will call if not tolerated. Patient continues to require CPAP to treat her OSA and is medically necessary.   1. OSA (obstructive sleep apnea) Continue excellent compliance  2. CPAP (continuous positive airway pressure) dependence CPAP couseling-Discussed importance of adequate CPAP use as well as proper care and cleaning techniques of machine and all supplies.  3. Essential hypertension Continue current medication and f/u with PCP.  4. Paroxysmal atrial fibrillation (HCC) Followed by cardiology, on eliquis  5. Adult hypothyroidism Continue current medication and f/u with PCP.  6. Obesity (BMI 30.0-34.9) Obesity Counseling: Had a lengthy discussion regarding patients BMI and weight issues. Patient was instructed on portion control as well as increased activity. Also discussed caloric restrictions with trying to maintain intake less than 2000 Kcal. Discussions were made in accordance with the 5As of weight management. Simple actions such as not eating late and if able to, taking a walk is suggested.    General Counseling: I have  discussed the findings of the evaluation and examination with Hinton Dyer.  I have also discussed any further diagnostic evaluation thatmay be needed or ordered today. Makaiah verbalizes understanding of the findings of todays visit. We also reviewed her medications today and discussed drug interactions and side effects including but not limited excessive drowsiness and altered mental states. We also discussed that there is always a risk not just to her but also people around her. she has been encouraged to call the office with any questions  or concerns that should arise related to todays visit.  No orders of the defined types were placed in this encounter.       I have personally obtained a history, examined the patient, evaluated laboratory and imaging results, formulated the assessment and plan and placed orders.  This patient was seen by Drema Dallas, PA-C in collaboration with Dr. Devona Konig as a part of collaborative care agreement.  Allyne Gee, MD [facility] Diplomate ABMS Pulmonary Critical Care Medicine and Sleep Medicine

## 2021-11-25 NOTE — Patient Instructions (Signed)

## 2021-12-13 ENCOUNTER — Encounter: Payer: Self-pay | Admitting: Cardiovascular Disease

## 2021-12-15 ENCOUNTER — Ambulatory Visit: Payer: Medicare PPO | Admitting: Internal Medicine

## 2021-12-23 ENCOUNTER — Ambulatory Visit: Payer: Medicare PPO | Admitting: Internal Medicine

## 2021-12-23 ENCOUNTER — Encounter: Payer: Self-pay | Admitting: Internal Medicine

## 2021-12-23 VITALS — BP 122/80 | HR 81 | Temp 97.1°F | Wt 247.0 lb

## 2021-12-23 DIAGNOSIS — I10 Essential (primary) hypertension: Secondary | ICD-10-CM | POA: Diagnosis not present

## 2021-12-23 DIAGNOSIS — B182 Chronic viral hepatitis C: Secondary | ICD-10-CM | POA: Diagnosis not present

## 2021-12-23 DIAGNOSIS — G4733 Obstructive sleep apnea (adult) (pediatric): Secondary | ICD-10-CM | POA: Diagnosis not present

## 2021-12-23 DIAGNOSIS — E782 Mixed hyperlipidemia: Secondary | ICD-10-CM

## 2021-12-23 DIAGNOSIS — M1990 Unspecified osteoarthritis, unspecified site: Secondary | ICD-10-CM | POA: Insufficient documentation

## 2021-12-23 DIAGNOSIS — N952 Postmenopausal atrophic vaginitis: Secondary | ICD-10-CM | POA: Diagnosis not present

## 2021-12-23 DIAGNOSIS — M1712 Unilateral primary osteoarthritis, left knee: Secondary | ICD-10-CM | POA: Diagnosis not present

## 2021-12-23 DIAGNOSIS — K219 Gastro-esophageal reflux disease without esophagitis: Secondary | ICD-10-CM

## 2021-12-23 DIAGNOSIS — Z6838 Body mass index (BMI) 38.0-38.9, adult: Secondary | ICD-10-CM

## 2021-12-23 DIAGNOSIS — E039 Hypothyroidism, unspecified: Secondary | ICD-10-CM | POA: Diagnosis not present

## 2021-12-23 DIAGNOSIS — I480 Paroxysmal atrial fibrillation: Secondary | ICD-10-CM | POA: Diagnosis not present

## 2021-12-23 MED ORDER — OMEPRAZOLE 20 MG PO CPDR: 20.0000 mg | DELAYED_RELEASE_CAPSULE | Freq: Every day | ORAL | 1 refills | Status: DC

## 2021-12-23 MED ORDER — CELECOXIB 100 MG PO CAPS: 100.0000 mg | ORAL_CAPSULE | Freq: Two times a day (BID) | ORAL | 1 refills | Status: DC

## 2021-12-23 MED ORDER — PREMARIN 0.625 MG/GM VA CREA: TOPICAL_CREAM | Freq: Every day | VAGINAL | 5 refills | Status: DC

## 2021-12-23 NOTE — Assessment & Plan Note (Signed)
Continue metoprolol and HCTZ C-Met today Reinforced DASH diet and exercise for weight loss We will monitor

## 2021-12-23 NOTE — Assessment & Plan Note (Signed)
Encourage weight loss as this can help reduce sleep apnea symptoms Continue CPAP use 

## 2021-12-23 NOTE — Assessment & Plan Note (Signed)
Encourage diet and exercise for weight loss 

## 2021-12-23 NOTE — Assessment & Plan Note (Signed)
Continue Omeprazole while on Celebrex

## 2021-12-23 NOTE — Progress Notes (Signed)
Subjective:    Patient ID: Meghan Cole, female    DOB: 1950-11-16, 71 y.o.   MRN: [id number]  HPI  Patient presents to clinic today for 106-month follow-up of chronic conditions.  HTN: Her BP today is 122/80.  She is taking HCTZ and Metoprolol as prescribed.  ECG from 03/2021 reviewed.  Hypothyroidism: She denies any issues on her current dose of Levothyroxine.  She does not follow with endocrinology.  OSA: She averages 8 hours of sleep per night with the use of her CPAP.  Sleep study from 08/2018 reviewed.  History of Hep C: In remission status post antiviral treatment.  She no longer follows with the liver care clinic.  A-fib: Managed on Metoprolol and Eliquis.  ECG from 03/2021 reviewed.  She follows with cardiology.  HLD: Her last LDL was 54, triglycerides 85, 06/2021.  She denies myalgias on Rosuvastatin.  She tries to consume low-fat diet.  GERD: Triggered by NSAID use.  She denies breakthrough on Omeprazole.  There is no upper GI on file.  OA: Mainly in her left leg.  She is taking Celebrex as prescribed with good relief of symptoms.  She does not follow with orthopedics.  Postmenopausal Vaginal Atrophy: Managed with Premarin cream. Review of Systems     Past Medical History:  Diagnosis Date   Anxiety 11/25/2015   Atrial septal defect    Chest pain 03/11/2018   Clinical depression 11/25/2015   Gravida 2 para 2 11/25/2015   2.     Heart disease    Hypertension    Thyroid disease     Current Outpatient Medications  Medication Sig Dispense Refill   celecoxib (CELEBREX) 100 MG capsule Take 1 capsule (100 mg total) by mouth 2 (two) times daily. 180 capsule 1   cholecalciferol (VITAMIN D) 1000 units tablet Take 1 tablet (1,000 Units total) by mouth daily. (Patient taking differently: Take 2,000 Units by mouth daily.)     Cranberry 125 MG TABS Take by mouth.     diphenhydrAMINE (BENADRYL) 12.5 MG chewable tablet Chew 12.5 mg by mouth as needed for sleep.     ELIQUIS 5 MG  TABS tablet TAKE 1 TABLET(5 MG) BY MOUTH TWICE DAILY 180 tablet 1   hydrochlorothiazide (HYDRODIURIL) 25 MG tablet Take 1 tablet (25 mg total) by mouth daily. 90 tablet 1   levothyroxine (SYNTHROID) 50 MCG tablet TAKE 1 TABLET BY MOUTH BEFORE BREAKFAST 90 tablet 1   Melatonin-Pyridoxine 5-10 MG TBCR Take by mouth.     metoprolol succinate (TOPROL-XL) 25 MG 24 hr tablet TAKE 1 TABLET BY MOUTH TWICE DAILY. TAKEWITH OR IMMEDIATELY FOLLOWING A MEAL 180 tablet 3   omeprazole (PRILOSEC) 20 MG capsule Take 1 capsule (20 mg total) by mouth daily. 30 capsule 3   PREMARIN vaginal cream PLACE VAGINALLY AT BEDTIME 30 g 2   rosuvastatin (CRESTOR) 10 MG tablet Take 1 tablet (10 mg total) by mouth daily. 90 tablet 3   No current facility-administered medications for this visit.    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)  Tobacco Use   Smoking status: Former   Smokeless tobacco: Former  Building services engineer Use: Never used  Substance and Sexual Activity   Alcohol use: No   Drug use: No   Sexual activity: Not on file  Other Topics Concern   Not on file  Social History Narrative   Not on file   Social Determinants of Health   Financial Resource Strain: Not on file  Food Insecurity: Not on file  Transportation Needs: Not on file  Physical Activity: Not on file  Stress: Not on file  Social Connections: Not on file  Intimate Partner Violence: Not on file     Constitutional: Denies fever, malaise,  fatigue, headache or abrupt weight changes.  HEENT: Denies eye pain, eye redness, ear pain, ringing in the ears, wax buildup, runny nose, nasal congestion, bloody nose, or sore throat. Respiratory: Denies difficulty breathing, shortness of breath, cough or sputum production.   Cardiovascular: Denies chest pain, chest tightness, palpitations or swelling in the hands or feet.  Gastrointestinal: Denies abdominal pain, bloating, constipation, diarrhea or blood in the stool.  GU: Denies urgency, frequency, pain with urination, burning sensation, blood in urine, odor or discharge. Musculoskeletal: Patient reports left leg pain.  Denies decrease in range of motion, difficulty with gait, muscle pain or joint swelling.  Skin: Denies redness, rashes, lesions or ulcercations.  Neurological: Denies dizziness, difficulty with memory, difficulty with speech or problems with balance and coordination.  Psych: Denies anxiety, depression, SI/HI.  No other specific complaints in a complete review of systems (except as listed in HPI above).  Objective:   Physical Exam BP 122/80 (BP Location: Left Arm, Patient Position: Sitting, Cuff Size: Large)   Pulse 81   Temp (!) 97.1 F (36.2 C) (Temporal)   Wt 247 lb (112 kg)   SpO2 99%   BMI 38.69 kg/m   Wt Readings from Last 3 Encounters:  11/25/21 216 lb (98 kg)  09/09/21 255 lb (115.7 kg)  06/16/21 255 lb 12.8 oz (116 kg)    General: Appears her stated age, obese, in NAD. Skin: Warm, dry and intact.  HEENT: Head: normal shape and size; Eyes: sclera white, no icterus, conjunctiva pink, PERRLA and EOMs intact;  Neck:  Neck supple, trachea midline. No masses, lumps or thyromegaly present.  Cardiovascular: Normal rate and rhythm. S1,S2 noted.  No murmur, rubs or gallops noted. No JVD or BLE edema. No carotid bruits noted. Pulmonary/Chest: Normal effort and positive vesicular breath sounds. No respiratory distress. No wheezes, rales or ronchi noted.   Musculoskeletal: No difficulty with gait.  Neurological: Alert and oriented.    BMET    Component Value Date/Time   NA 140 06/17/2021 0831   NA 139 10/06/2019 0905   K 4.0 06/17/2021 0831   CL 100 06/17/2021 0831   CO2 32 06/17/2021 0831   GLUCOSE 92 06/17/2021 0831   BUN 12 06/17/2021 0831   BUN 10 10/06/2019 0905   CREATININE 0.77 06/17/2021 0831   CALCIUM 9.7 06/17/2021 0831   GFRNONAA 91 12/06/2020 0742   GFRAA 105 12/06/2020 0742    Lipid Panel     Component Value Date/Time   CHOL 131 06/17/2021 0831   CHOL 140 10/06/2019 0905   TRIG 85 06/17/2021 0831   HDL 60 06/17/2021 0831  HDL 70 10/06/2019 0905   CHOLHDL 2.2 06/17/2021 0831   LDLCALC 54 06/17/2021 0831    CBC    Component Value Date/Time   WBC 6.5 06/17/2021 0831   RBC 3.99 06/17/2021 0831   HGB 12.3 06/17/2021 0831   HGB 12.6 10/06/2019 0905   HCT 37.8 06/17/2021 0831   HCT 37.6 10/06/2019 0905   PLT 166 06/17/2021 0831   PLT 185 10/06/2019 0905   MCV 94.7 06/17/2021 0831   MCV 91 10/06/2019 0905   MCH 30.8 06/17/2021 0831   MCHC 32.5 06/17/2021 0831   RDW 13.1 06/17/2021 0831   RDW 13.2 10/06/2019 0905   LYMPHSABS 1.5 10/06/2019 0905   EOSABS 0.1 10/06/2019 0905   BASOSABS 0.1 10/06/2019 0905    Hgb A1C Lab Results  Component Value Date   HGBA1C 5.4 12/06/2020           Assessment & Plan:   RTC in 6 months for your annual exam Nicki Reaper, NP

## 2021-12-23 NOTE — Assessment & Plan Note (Signed)
C-Met and lipid profile today Encouraged her to consume a low-fat diet Continue rosuvastatin 

## 2021-12-23 NOTE — Assessment & Plan Note (Signed)
TSH and free T4 today We will adjust levothyroxine if needed based on labs 

## 2021-12-23 NOTE — Assessment & Plan Note (Signed)
Continue Celebrex. 

## 2021-12-23 NOTE — Patient Instructions (Signed)
Heart-Healthy Eating Plan Heart-healthy meal planning includes: Eating less unhealthy fats. Eating more healthy fats. Making other changes in your diet. Talk with your doctor or a diet specialist (dietitian) to create an eating plan that is right for you. What is my plan? Your doctor may recommend an eating plan that includes: Total fat: ______% or less of total calories a day. Saturated fat: ______% or less of total calories a day. Cholesterol: less than _________mg a day. What are tips for following this plan? Cooking Avoid frying your food. Try to bake, boil, grill, or broil it instead. You can also reduce fat by: Removing the skin from poultry. Removing all visible fats from meats. Steaming vegetables in water or broth. Meal planning  At meals, divide your plate into four equal parts: Fill one-half of your plate with vegetables and green salads. Fill one-fourth of your plate with whole grains. Fill one-fourth of your plate with lean protein foods. Eat 4-5 servings of vegetables per day. A serving of vegetables is: 1 cup of raw or cooked vegetables. 2 cups of raw leafy greens. Eat 4-5 servings of fruit per day. A serving of fruit is: 1 medium whole fruit.  cup of dried fruit.  cup of fresh, frozen, or canned fruit.  cup of 100% fruit juice. Eat more foods that have soluble fiber. These are apples, broccoli, carrots, beans, peas, and barley. Try to get 20-30 g of fiber per day. Eat 4-5 servings of nuts, legumes, and seeds per week: 1 serving of dried beans or legumes equals  cup after being cooked. 1 serving of nuts is  cup. 1 serving of seeds equals 1 tablespoon. General information Eat more home-cooked food. Eat less restaurant, buffet, and fast food. Limit or avoid alcohol. Limit foods that are high in starch and sugar. Avoid fried foods. Lose weight if you are overweight. Keep track of how much salt (sodium) you eat. This is important if you have high blood  pressure. Ask your doctor to tell you more about this. Try to add vegetarian meals each week. Fats Choose healthy fats. These include olive oil and canola oil, flaxseeds, walnuts, almonds, and seeds. Eat more omega-3 fats. These include salmon, mackerel, sardines, tuna, flaxseed oil, and ground flaxseeds. Try to eat fish at least 2 times each week. Check food labels. Avoid foods with trans fats or high amounts of saturated fat. Limit saturated fats. These are often found in animal products, such as meats, butter, and cream. These are also found in plant foods, such as palm oil, palm kernel oil, and coconut oil. Avoid foods with partially hydrogenated oils in them. These have trans fats. Examples are stick margarine, some tub margarines, cookies, crackers, and other baked goods. What foods can I eat? Fruits All fresh, canned (in natural juice), or frozen fruits. Vegetables Fresh or frozen vegetables (raw, steamed, roasted, or grilled). Green salads. Grains Most grains. Choose whole wheat and whole grains most of the time. Rice and pasta, including brown rice and pastas made with whole wheat. Meats and other proteins Lean, well-trimmed beef, veal, pork, and lamb. Chicken and turkey without skin. All fish and shellfish. Wild duck, rabbit, pheasant, and venison. Egg whites or low-cholesterol egg substitutes. Dried beans, peas, lentils, and tofu. Seeds and most nuts. Dairy Low-fat or nonfat cheeses, including ricotta and mozzarella. Skim or 1% milk that is liquid, powdered, or evaporated. Buttermilk that is made with low-fat milk. Nonfat or low-fat yogurt. Fats and oils Non-hydrogenated (trans-free) margarines. Vegetable oils, including   soybean, sesame, sunflower, olive, peanut, safflower, corn, canola, and cottonseed. Salad dressings or mayonnaise made with a vegetable oil. Beverages Mineral water. Coffee and tea. Diet carbonated beverages. Sweets and desserts Sherbet, gelatin, and fruit ice.  Small amounts of dark chocolate. Limit all sweets and desserts. Seasonings and condiments All seasonings and condiments. The items listed above may not be a complete list of foods and drinks you can eat. Contact a dietitian for more options. What foods should I avoid? Fruits Canned fruit in heavy syrup. Fruit in cream or butter sauce. Fried fruit. Limit coconut. Vegetables Vegetables cooked in cheese, cream, or butter sauce. Fried vegetables. Grains Breads that are made with saturated or trans fats, oils, or whole milk. Croissants. Sweet rolls. Donuts. High-fat crackers, such as cheese crackers. Meats and other proteins Fatty meats, such as hot dogs, ribs, sausage, bacon, rib-eye roast or steak. High-fat deli meats, such as salami and bologna. Caviar. Domestic duck and goose. Organ meats, such as liver. Dairy Cream, sour cream, cream cheese, and creamed cottage cheese. Whole-milk cheeses. Whole or 2% milk that is liquid, evaporated, or condensed. Whole buttermilk. Cream sauce or high-fat cheese sauce. Yogurt that is made from whole milk. Fats and oils Meat fat, or shortening. Cocoa butter, hydrogenated oils, palm oil, coconut oil, palm kernel oil. Solid fats and shortenings, including bacon fat, salt pork, lard, and butter. Nondairy cream substitutes. Salad dressings with cheese or sour cream. Beverages Regular sodas and juice drinks with added sugar. Sweets and desserts Frosting. Pudding. Cookies. Cakes. Pies. Milk chocolate or white chocolate. Buttered syrups. Full-fat ice cream or ice cream drinks. The items listed above may not be a complete list of foods and drinks to avoid. Contact a dietitian for more information. Summary Heart-healthy meal planning includes eating less unhealthy fats, eating more healthy fats, and making other changes in your diet. Eat a balanced diet. This includes fruits and vegetables, low-fat or nonfat dairy, lean protein, nuts and legumes, whole grains, and  heart-healthy oils and fats. This information is not intended to replace advice given to you by your health care provider. Make sure you discuss any questions you have with your health care provider. Document Revised: 10/03/2020 Document Reviewed: 10/03/2020 Elsevier Patient Education  2022 Elsevier Inc.  

## 2021-12-23 NOTE — Assessment & Plan Note (Signed)
Continue metoprolol and Eliquis She will continue to follow with cardiology 

## 2021-12-23 NOTE — Assessment & Plan Note (Signed)
Continue Premarin

## 2021-12-23 NOTE — Assessment & Plan Note (Signed)
C-Met today 

## 2021-12-24 LAB — COMPLETE METABOLIC PANEL WITH GFR
AG Ratio: 1.7 (calc) (ref 1.0–2.5)
ALT: 14 U/L (ref 6–29)
AST: 22 U/L (ref 10–35)
Albumin: 4.3 g/dL (ref 3.6–5.1)
Alkaline phosphatase (APISO): 64 U/L (ref 37–153)
BUN: 16 mg/dL (ref 7–25)
CO2: 32 mmol/L (ref 20–32)
Calcium: 9.8 mg/dL (ref 8.6–10.4)
Chloride: 99 mmol/L (ref 98–110)
Creat: 0.78 mg/dL (ref 0.50–1.05)
Globulin: 2.5 g/dL (calc) (ref 1.9–3.7)
Glucose, Bld: 85 mg/dL (ref 65–139)
Potassium: 3.6 mmol/L (ref 3.5–5.3)
Sodium: 139 mmol/L (ref 135–146)
Total Bilirubin: 0.9 mg/dL (ref 0.2–1.2)
Total Protein: 6.8 g/dL (ref 6.1–8.1)
eGFR: 83 mL/min/{1.73_m2} (ref >=60)

## 2021-12-24 LAB — T4, FREE: Free T4: 1.2 ng/dL (ref 0.8–1.8)

## 2021-12-24 LAB — CBC
HCT: 36.3 % (ref 35.0–45.0)
Hemoglobin: 12.2 g/dL (ref 11.7–15.5)
MCH: 31.3 pg (ref 27.0–33.0)
MCHC: 33.6 g/dL (ref 32.0–36.0)
MCV: 93.1 fL (ref 80.0–100.0)
MPV: 10.9 fL (ref 7.5–12.5)
Platelets: 184 10*3/uL (ref 140–400)
RBC: 3.9 10*6/uL (ref 3.80–5.10)
RDW: 13.5 % (ref 11.0–15.0)
WBC: 5.2 10*3/uL (ref 3.8–10.8)

## 2021-12-24 LAB — LIPID PANEL
Cholesterol: 135 mg/dL (ref <200)
HDL: 65 mg/dL (ref >=50)
LDL Cholesterol (Calc): 54 mg/dL (calc)
Non-HDL Cholesterol (Calc): 70 mg/dL (calc) (ref <130)
Total CHOL/HDL Ratio: 2.1 (calc) (ref <5.0)
Triglycerides: 82 mg/dL (ref <150)

## 2021-12-24 LAB — TSH: TSH: 1.65 mIU/L (ref 0.40–4.50)

## 2021-12-26 DIAGNOSIS — F411 Generalized anxiety disorder: Secondary | ICD-10-CM | POA: Diagnosis not present

## 2022-01-15 ENCOUNTER — Encounter: Payer: Self-pay | Admitting: Internal Medicine

## 2022-02-06 DIAGNOSIS — F411 Generalized anxiety disorder: Secondary | ICD-10-CM | POA: Diagnosis not present

## 2022-02-13 DIAGNOSIS — F411 Generalized anxiety disorder: Secondary | ICD-10-CM | POA: Diagnosis not present

## 2022-02-20 DIAGNOSIS — F411 Generalized anxiety disorder: Secondary | ICD-10-CM | POA: Diagnosis not present

## 2022-03-06 DIAGNOSIS — F411 Generalized anxiety disorder: Secondary | ICD-10-CM | POA: Diagnosis not present

## 2022-03-10 ENCOUNTER — Encounter: Payer: Self-pay | Admitting: Internal Medicine

## 2022-03-13 DIAGNOSIS — F411 Generalized anxiety disorder: Secondary | ICD-10-CM | POA: Diagnosis not present

## 2022-03-31 NOTE — Progress Notes (Unsigned)
Cardiology Office Note  Date:  04/01/2022   ID:  Meghan Cole, DOB 1950/10/19, MRN [id number]  PCP:  Jearld Fenton, NP   Chief Complaint  Patient presents with   12 month follow up     "Doing well." Medications reviewed by the patient verbally.     HPI:  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  who presents for follow-up of her permanent atrial fibrillation  LOV 10/22  Weight down >20 pounds, eating less Stays busy  Denies any chest pain or shortness of breath on exertion Has noted episodes of low heart rate in the 40s, short-lived sometimes also with tachycardia rate in the 120s Remains on metoprolol succinate 25 twice daily No leg edema, no PND orthopnea  Some stress Mother is 57 in [place], ailing health Lost son in March 2021  CT coronary calcium scoring 1. Coronary calcium score of 430.  2.  An occluder device is seen in the interatrial septum.  EKG personally reviewed by myself on todays visit Shows atrial fibrillation ventricular rate 66 bpm nonspecific T wave abnormality precordial leads, No significant change   Other past medical history reviewed Echocardiogram October 2019 Left ventricle: The cavity size was normal. Wall thickness was   normal. Systolic function was normal. The estimated ejection   fraction was in the range of 55% to 60%. Wall motion was normal;   there were no regional wall motion abnormalities. Left   ventricular diastolic function parameters were normal. - Left atrium: The atrium was mildly dilated.   husband with bypass surgery and valve disease  Seen in the office  09/2017 Propafenone increased up to 150 TID  With metoprolol succinate 25 BID DCCV Oct 13, 2017  lasted only 6 days  EKG 10/2017: atrial fibrillation at [facility] Propafenone stopped by [facility] 10/2017 [facility] EP Discussed other options for rhythm control including dofetilide loading vs ablation. Compliant with anticoagulation/eliquis  Other past medical history reviewed Did not tolerate flecainide, some nausea  Previous event monitor  Documenting atrial fibrillation Several episodes of atrial fibrillation both on May 19, first episode appeared regular and rapid, second episode or irregular consistent with atrial fibrillation Atrial fib last  Sat, 10/24/2016 10:45 Am, rate 154, up to 174 on tele,  Back to NSR 12:45 Had episode at 5 PM  Father with CAD, MI,  atrial fib  PMH:   has a past medical history of Anxiety (11/25/2015), Atrial septal defect, Chest pain (03/11/2018), Clinical depression (11/25/2015), 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  PSH:    Past Surgical History:  Procedure Laterality Date   ATRIAL SEPTAL DEFECT(ASD) CLOSURE     CARDIOVERSION N/A 10/13/2017   Procedure: CARDIOVERSION;  Surgeon: Minna Merritts, MD;  Location: [facility] ORS;  Service: Cardiovascular;  Laterality: N/A;   COLONOSCOPY WITH PROPOFOL     2011   EXPLORATION POST OPERATIVE OPEN HEART      Current Outpatient Medications  Medication Sig Dispense Refill   celecoxib (CELEBREX) 100 MG capsule Take 1 capsule (100 mg total) by mouth 2 (two) times daily. 180 capsule 1   cholecalciferol (VITAMIN D) 1000 units  tablet Take 1 tablet (1,000 Units total) by mouth daily. (Patient taking differently: Take 2,000 Units by mouth daily.)     conjugated estrogens (PREMARIN) vaginal cream Place vaginally at bedtime. 30 g 5   Cranberry 125 MG TABS Take by mouth.     diphenhydrAMINE (BENADRYL) 12.5 MG chewable tablet Chew 12.5 mg by mouth as needed for sleep.     hydrochlorothiazide  (HYDRODIURIL) 25 MG tablet Take 1 tablet (25 mg total) by mouth daily. 90 tablet 1   levothyroxine (SYNTHROID) 50 MCG tablet TAKE 1 TABLET BY MOUTH BEFORE BREAKFAST 90 tablet 1   Melatonin-Pyridoxine 5-10 MG TBCR Take by mouth.     omeprazole (PRILOSEC) 20 MG capsule Take 1 capsule (20 mg total) by mouth daily. 90 capsule 1   apixaban (ELIQUIS) 5 MG TABS tablet TAKE 1 TABLET(5 MG) BY MOUTH TWICE DAILY 180 tablet 3   metoprolol succinate (TOPROL-XL) 25 MG 24 hr tablet TAKE 1 TABLET BY MOUTH TWICE DAILY. TAKEWITH OR IMMEDIATELY FOLLOWING A MEAL 180 tablet 3   rosuvastatin (CRESTOR) 10 MG tablet Take 1 tablet (10 mg total) by mouth daily. 90 tablet 3   No current facility-administered medications for this visit.    Allergies:   Patient has no known allergies.   Social History:  The patient  reports that she has quit smoking. She has quit using smokeless tobacco. She reports that she does not drink alcohol and does not use drugs.   Family History:   family history includes Breast cancer in her maternal grandmother; Cancer in her mother; Heart attack in her father; Heart disease in her father.   Review of Systems: Review of Systems  Constitutional: Negative.   HENT: Negative.    Respiratory: Negative.    Cardiovascular: Negative.   Gastrointestinal: Negative.   Musculoskeletal: Negative.   Neurological: Negative.   Psychiatric/Behavioral: Negative.    All other systems reviewed and are negative.   PHYSICAL EXAM: VS:  BP 120/60 (BP Location: Left Arm, Patient Position: Sitting, Cuff Size: Normal)   Pulse 66   Ht 5' 7.5" (1.715 m)   Wt 232 lb 6 oz (105.4 kg)   SpO2 97%   BMI 35.86 kg/m  , BMI Body mass index is 35.86 kg/m. Constitutional:  oriented to person, place, and time. No distress.  HENT:  Head: Grossly normal Eyes:  no discharge. No scleral icterus.  Neck: No JVD, no carotid bruits  Cardiovascular: Regular rate and rhythm, no murmurs appreciated Pulmonary/Chest: Clear to  auscultation bilaterally, no wheezes or rails Abdominal: Soft.  no distension.  no tenderness.  Musculoskeletal: Normal range of motion Neurological:  normal muscle tone. Coordination normal. No atrophy Skin: Skin warm and dry Psychiatric: normal affect, pleasant   Recent Labs: 12/23/2021: ALT 14; BUN 16; Creat 0.78; Hemoglobin 12.2; Platelets 184; Potassium 3.6; Sodium 139; TSH 1.65    Lipid Panel Lab Results  Component Value Date   CHOL 135 12/23/2021   HDL 65 12/23/2021   LDLCALC 54 12/23/2021   TRIG 82 12/23/2021    Wt Readings from Last 3 Encounters:  04/01/22 232 lb 6 oz (105.4 kg)  12/23/21 247 lb (112 kg)  11/25/21 216 lb (98 kg)     ASSESSMENT AND PLAN:   Permanent atrial fibrillation Dating back to 2017  Eliquis 5 twice daily, on metoprolol,  Asymptomatic in general Does report some episodes running low ventricular rate in the 40s, other times up to the 120s without provocation Recommended if symptoms persist or get worse,  Zio monitor could be ordered  Chest pain No active chest pain symptoms Prior smoking history 30 years but stopped 20 years ago No further ischemic work-up at this time  Essential hypertension - Blood pressure is well controlled on today's visit. No changes made to the medications.  Morbid obesity Weight down over 20 pounds, has changed diet    Total encounter time more than 30 minutes  Greater than 50% was spent in counseling and coordination of care with the patient    Orders Placed This Encounter  Procedures   EKG 12-Lead     Signed, Dossie Arbour, M.D., Ph.D. 04/01/2022  [facility] [place], [place] [phone]

## 2022-04-01 ENCOUNTER — Ambulatory Visit: Payer: Medicare PPO | Attending: Cardiovascular Disease | Admitting: Cardiovascular Disease

## 2022-04-01 ENCOUNTER — Encounter: Payer: Self-pay | Admitting: Cardiovascular Disease

## 2022-04-01 VITALS — BP 120/60 | HR 66 | Ht 67.5 in | Wt 232.4 lb

## 2022-04-01 DIAGNOSIS — I10 Essential (primary) hypertension: Secondary | ICD-10-CM | POA: Diagnosis not present

## 2022-04-01 DIAGNOSIS — I4821 Permanent atrial fibrillation: Secondary | ICD-10-CM

## 2022-04-01 DIAGNOSIS — I7090 Unspecified atherosclerosis: Secondary | ICD-10-CM | POA: Diagnosis not present

## 2022-04-01 DIAGNOSIS — Z87891 Personal history of nicotine dependence: Secondary | ICD-10-CM

## 2022-04-01 DIAGNOSIS — E7800 Pure hypercholesterolemia, unspecified: Secondary | ICD-10-CM | POA: Diagnosis not present

## 2022-04-01 DIAGNOSIS — R079 Chest pain, unspecified: Secondary | ICD-10-CM | POA: Diagnosis not present

## 2022-04-01 MED ORDER — METOPROLOL SUCCINATE ER 25 MG PO TB24: ORAL_TABLET | ORAL | 3 refills | Status: DC

## 2022-04-01 MED ORDER — APIXABAN 5 MG PO TABS: ORAL_TABLET | ORAL | 3 refills | Status: DC

## 2022-04-01 MED ORDER — ROSUVASTATIN CALCIUM 10 MG PO TABS: 10.0000 mg | ORAL_TABLET | Freq: Every day | ORAL | 3 refills | Status: DC

## 2022-04-01 NOTE — Patient Instructions (Addendum)
Medication Instructions:  No changes  If you need a refill on your cardiac medications before your next appointment, please call your pharmacy.   Lab work: No new labs needed  Testing/Procedures: No new testing needed  Follow-Up: At [facility], you and your health needs are our priority.  As part of our continuing mission to provide you with exceptional heart care, we have created designated Provider Care Teams.  These Care Teams include your primary Cardiologist (physician) and Advanced Practice Providers (APPs -  Physician Assistants and Nurse Practitioners) who all work together to provide you with the care you need, when you need it.  You will need a follow up appointment in 12 months  Providers on your designated Care Team:   Christopher Berge, NP Ryan Dunn, PA-C Cadence Furth, PA-C  COVID-19 Vaccine Information can be found at: [email or web address] For questions related to vaccine distribution or appointments, please email [email or web address] or call [phone].   

## 2022-04-02 DIAGNOSIS — F411 Generalized anxiety disorder: Secondary | ICD-10-CM | POA: Diagnosis not present

## 2022-04-07 ENCOUNTER — Encounter: Payer: Self-pay | Admitting: Internal Medicine

## 2022-05-12 ENCOUNTER — Encounter: Payer: Self-pay | Admitting: Cardiovascular Disease

## 2022-05-13 ENCOUNTER — Encounter: Payer: Self-pay | Admitting: Internal Medicine

## 2022-05-13 DIAGNOSIS — F411 Generalized anxiety disorder: Secondary | ICD-10-CM | POA: Diagnosis not present

## 2022-05-14 MED ORDER — HYDROCHLOROTHIAZIDE 25 MG PO TABS: 25.0000 mg | ORAL_TABLET | Freq: Every day | ORAL | 0 refills | Status: DC

## 2022-05-15 DIAGNOSIS — F411 Generalized anxiety disorder: Secondary | ICD-10-CM | POA: Diagnosis not present

## 2022-05-21 DIAGNOSIS — F411 Generalized anxiety disorder: Secondary | ICD-10-CM | POA: Diagnosis not present

## 2022-05-27 DIAGNOSIS — F411 Generalized anxiety disorder: Secondary | ICD-10-CM | POA: Diagnosis not present

## 2022-06-12 DIAGNOSIS — F1021 Alcohol dependence, in remission: Secondary | ICD-10-CM | POA: Diagnosis not present

## 2022-06-12 DIAGNOSIS — F411 Generalized anxiety disorder: Secondary | ICD-10-CM | POA: Diagnosis not present

## 2022-06-12 DIAGNOSIS — F4381 Prolonged grief disorder: Secondary | ICD-10-CM | POA: Diagnosis not present

## 2022-06-25 DIAGNOSIS — F411 Generalized anxiety disorder: Secondary | ICD-10-CM | POA: Diagnosis not present

## 2022-07-01 ENCOUNTER — Encounter: Payer: Self-pay | Admitting: Internal Medicine

## 2022-07-01 ENCOUNTER — Ambulatory Visit: Payer: Medicare PPO | Admitting: Internal Medicine

## 2022-07-01 VITALS — BP 126/84 | HR 61 | Temp 96.9°F | Ht 67.0 in | Wt 236.0 lb

## 2022-07-01 DIAGNOSIS — Z6836 Body mass index (BMI) 36.0-36.9, adult: Secondary | ICD-10-CM

## 2022-07-01 DIAGNOSIS — N952 Postmenopausal atrophic vaginitis: Secondary | ICD-10-CM | POA: Diagnosis not present

## 2022-07-01 DIAGNOSIS — Z0001 Encounter for general adult medical examination with abnormal findings: Secondary | ICD-10-CM | POA: Diagnosis not present

## 2022-07-01 DIAGNOSIS — E039 Hypothyroidism, unspecified: Secondary | ICD-10-CM | POA: Diagnosis not present

## 2022-07-01 DIAGNOSIS — E782 Mixed hyperlipidemia: Secondary | ICD-10-CM | POA: Diagnosis not present

## 2022-07-01 DIAGNOSIS — E66812 Obesity, class 2: Secondary | ICD-10-CM

## 2022-07-01 DIAGNOSIS — R7309 Other abnormal glucose: Secondary | ICD-10-CM | POA: Diagnosis not present

## 2022-07-01 DIAGNOSIS — R739 Hyperglycemia, unspecified: Secondary | ICD-10-CM

## 2022-07-01 MED ORDER — CELECOXIB 100 MG PO CAPS: 100.0000 mg | ORAL_CAPSULE | Freq: Two times a day (BID) | ORAL | 1 refills | Status: DC

## 2022-07-01 MED ORDER — OMEPRAZOLE 20 MG PO CPDR: 20.0000 mg | DELAYED_RELEASE_CAPSULE | Freq: Every day | ORAL | 1 refills | Status: DC

## 2022-07-01 MED ORDER — PREMARIN 0.625 MG/GM VA CREA: TOPICAL_CREAM | Freq: Every day | VAGINAL | 5 refills | Status: DC

## 2022-07-01 MED ORDER — HYDROCHLOROTHIAZIDE 25 MG PO TABS: 25.0000 mg | ORAL_TABLET | Freq: Every day | ORAL | 1 refills | Status: DC

## 2022-07-01 NOTE — Progress Notes (Signed)
Subjective:    Patient ID: Meghan Cole, female    DOB: Mar 23, 1951, 72 y.o.   MRN: [id number]  HPI  Patient presents to clinic today for her annual exam.  Flu: 03/2022 Tetanus: unsure COVID: X 4 Pneumovax: 06/2021 Prevnar: 02/2019 Shingrix: 10/2016, 12/2016 Pap smear: 11/2017 Mammogram: 10/2019 Bone density: never Colon screening: 02/2019, Cologuard Vision screening: annually Dentist: biannually  Diet: She does eat meat. She consumes fruits and veggies. She does eat some fried foods. She drinks mostly water. Exercise: Elliptical, walking  Review of Systems    Past Medical History:  Diagnosis Date   Anxiety 11/25/2015   Atrial septal defect    Chest pain 03/11/2018   Clinical depression 11/25/2015   Gravida 2 para 2 11/25/2015   2.     Heart disease    Hypertension    Thyroid disease     Current Outpatient Medications  Medication Sig Dispense Refill   apixaban (ELIQUIS) 5 MG TABS tablet TAKE 1 TABLET(5 MG) BY MOUTH TWICE DAILY 180 tablet 3   celecoxib (CELEBREX) 100 MG capsule Take 1 capsule (100 mg total) by mouth 2 (two) times daily. 180 capsule 1   cholecalciferol (VITAMIN D) 1000 units tablet Take 1 tablet (1,000 Units total) by mouth daily. (Patient taking differently: Take 2,000 Units by mouth daily.)     conjugated estrogens (PREMARIN) vaginal cream Place vaginally at bedtime. 30 g 5   Cranberry 125 MG TABS Take by mouth.     diphenhydrAMINE (BENADRYL) 12.5 MG chewable tablet Chew 12.5 mg by mouth as needed for sleep.     hydrochlorothiazide (HYDRODIURIL) 25 MG tablet Take 1 tablet (25 mg total) by mouth daily. Please schedule an appointment before anymore refills. 90 tablet 0   levothyroxine (SYNTHROID) 50 MCG tablet TAKE 1 TABLET BY MOUTH BEFORE BREAKFAST 90 tablet 1   Melatonin-Pyridoxine 5-10 MG TBCR Take by mouth.     metoprolol succinate (TOPROL-XL) 25 MG 24 hr tablet TAKE 1 TABLET BY MOUTH TWICE DAILY. TAKEWITH OR IMMEDIATELY FOLLOWING A MEAL 180 tablet 3    omeprazole (PRILOSEC) 20 MG capsule Take 1 capsule (20 mg total) by mouth daily. 90 capsule 1   rosuvastatin (CRESTOR) 10 MG tablet Take 1 tablet (10 mg total) by mouth daily. 90 tablet 3   No current facility-administered medications for this visit.    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)  Tobacco Use   Smoking status: Former   Smokeless tobacco: Former  Scientific laboratory technician Use: Never used  Substance and Sexual Activity   Alcohol use: No   Drug use: No   Sexual activity: Not on file  Other Topics Concern   Not on file  Social History Narrative   Not on file   Social Determinants of Health   Financial Resource Strain: Not on file  Food Insecurity: Not on  file  Transportation Needs: Not on file  Physical Activity: Not on file  Stress: Not on file  Social Connections: Not on file  Intimate Partner Violence: Not on file     Constitutional: Denies fever, malaise, fatigue, headache or abrupt weight changes.  HEENT: Denies eye pain, eye redness, ear pain, ringing in the ears, wax buildup, runny nose, nasal congestion, bloody nose, or sore throat. Respiratory: Denies difficulty breathing, shortness of breath, cough or sputum production.   Cardiovascular: Denies chest pain, chest tightness, palpitations or swelling in the hands or feet.  Gastrointestinal: Denies abdominal pain, bloating, constipation, diarrhea or blood in the stool.  GU: Denies urgency, frequency, pain with urination,  burning sensation, blood in urine, odor or discharge. Musculoskeletal: Denies decrease in range of motion, difficulty with gait, muscle pain or joint pain and swelling.  Skin: Denies redness, rashes, lesions or ulcercations.  Neurological: Denies dizziness, difficulty with memory, difficulty with speech or problems with balance and coordination.  Psych: Denies anxiety, depression, SI/HI.  No other specific complaints in a complete review of systems (except as listed in HPI above).  Objective:   Physical Exam   BP 126/84 (BP Location: Left Arm, Patient Position: Sitting, Cuff Size: Normal)   Pulse 61   Temp (!) 96.9 F (36.1 C) (Temporal)   Ht 5\' 7"  (1.702 m)   Wt 236 lb (107 kg)   SpO2 100%   BMI 36.96 kg/m   Wt Readings from Last 3 Encounters:  04/01/22 232 lb 6 oz (105.4 kg)  12/23/21 247 lb (112 kg)  11/25/21 216 lb (98 kg)    General: Appears her stated age, obese, in NAD. Skin: Warm, dry and intact. No rashes, lesions or ulcerations noted. HEENT: Head: normal shape and size; Eyes: sclera white, no icterus, conjunctiva pink, PERRLA and EOMs intact;  Neck:  Neck supple, trachea midline. No masses, lumps or thyromegaly present.  Cardiovascular: Normal rate and rhythm. S1,S2 noted.  No murmur, rubs or gallops noted. No JVD or BLE edema. No carotid bruits noted. Pulmonary/Chest: Normal effort and positive vesicular breath sounds. No respiratory distress. No wheezes, rales or ronchi noted.  Abdomen: Normal bowel sounds.  Musculoskeletal: Strength 5/5 BUE/BLE.  No difficulty with gait.  Neurological: Alert and oriented. Cranial nerves II-XII grossly intact. Coordination normal.  Psychiatric: Mood and affect normal. Behavior is normal. Judgment and thought content normal.    BMET    Component Value Date/Time   NA 139 12/23/2021 0847   NA 139 10/06/2019 0905   K 3.6 12/23/2021 0847   CL 99 12/23/2021 0847   CO2 32 12/23/2021 0847   GLUCOSE 85 12/23/2021 0847   BUN 16  12/23/2021 0847   BUN 10 10/06/2019 0905   CREATININE 0.78 12/23/2021 0847   CALCIUM 9.8 12/23/2021 0847   GFRNONAA 91 12/06/2020 0742   GFRAA 105 12/06/2020 0742    Lipid Panel     Component Value Date/Time   CHOL 135 12/23/2021 0847   CHOL 140 10/06/2019 0905   TRIG 82 12/23/2021 0847   HDL 65 12/23/2021 0847   HDL 70 10/06/2019 0905   CHOLHDL 2.1 12/23/2021 0847   LDLCALC 54 12/23/2021 0847    CBC    Component Value Date/Time   WBC 5.2 12/23/2021 0847   RBC 3.90 12/23/2021 0847   HGB 12.2 12/23/2021 0847   HGB 12.6 10/06/2019 0905   HCT 36.3 12/23/2021 0847   HCT 37.6 10/06/2019 0905   PLT 184 12/23/2021 0847   PLT  185 10/06/2019 0905   MCV 93.1 12/23/2021 0847   MCV 91 10/06/2019 0905   MCH 31.3 12/23/2021 0847   MCHC 33.6 12/23/2021 0847   RDW 13.5 12/23/2021 0847   RDW 13.2 10/06/2019 0905   LYMPHSABS 1.5 10/06/2019 0905   EOSABS 0.1 10/06/2019 0905   BASOSABS 0.1 10/06/2019 0905    Hgb A1C Lab Results  Component Value Date   HGBA1C 5.4 12/06/2020           Assessment & Plan:   Preventative Health Maintenance:  Flu shot UTD She declines tetanus for financial reasons, advised if she gets better could go get this done at the pharmacy Encouraged her to get her COVID booster Pneumovax and Prevnar UTD Shingrix UTD She no longer wants to screen for cervical cancer She does not want to screen for breast cancer She does not want to screen for osteoporosis Cologuard ordered Encouraged her to consume a balanced diet and exercise regimen Advised her seeing eye doctor and dentist annually We will check CBC, c-Met, TSH, free T4, lipid and A1c today  RTC in 6 months, follow-up chronic conditions Webb Silversmith, NP

## 2022-07-01 NOTE — Patient Instructions (Signed)
Health Maintenance for Postmenopausal Women Menopause is a normal process in which your ability to get pregnant comes to an end. This process happens slowly over many months or years, usually between the ages of 48 and 55. Menopause is complete when you have missed your menstrual period for 12 months. It is important to talk with your health care provider about some of the most common conditions that affect women after menopause (postmenopausal women). These include heart disease, cancer, and bone loss (osteoporosis). Adopting a healthy lifestyle and getting preventive care can help to promote your health and wellness. The actions you take can also lower your chances of developing some of these common conditions. What are the signs and symptoms of menopause? During menopause, you may have the following symptoms: Hot flashes. These can be moderate or severe. Night sweats. Decrease in sex drive. Mood swings. Headaches. Tiredness (fatigue). Irritability. Memory problems. Problems falling asleep or staying asleep. Talk with your health care provider about treatment options for your symptoms. Do I need hormone replacement therapy? Hormone replacement therapy is effective in treating symptoms that are caused by menopause, such as hot flashes and night sweats. Hormone replacement carries certain risks, especially as you become older. If you are thinking about using estrogen or estrogen with progestin, discuss the benefits and risks with your health care provider. How can I reduce my risk for heart disease and stroke? The risk of heart disease, heart attack, and stroke increases as you age. One of the causes may be a change in the body's hormones during menopause. This can affect how your body uses dietary fats, triglycerides, and cholesterol. Heart attack and stroke are medical emergencies. There are many things that you can do to help prevent heart disease and stroke. Watch your blood pressure High  blood pressure causes heart disease and increases the risk of stroke. This is more likely to develop in people who have high blood pressure readings or are overweight. Have your blood pressure checked: Every 3-5 years if you are 18-39 years of age. Every year if you are 40 years old or older. Eat a healthy diet  Eat a diet that includes plenty of vegetables, fruits, low-fat dairy products, and lean protein. Do not eat a lot of foods that are high in solid fats, added sugars, or sodium. Get regular exercise Get regular exercise. This is one of the most important things you can do for your health. Most adults should: Try to exercise for at least 150 minutes each week. The exercise should increase your heart rate and make you sweat (moderate-intensity exercise). Try to do strengthening exercises at least twice each week. Do these in addition to the moderate-intensity exercise. Spend less time sitting. Even light physical activity can be beneficial. Other tips Work with your health care provider to achieve or maintain a healthy weight. Do not use any products that contain nicotine or tobacco. These products include cigarettes, chewing tobacco, and vaping devices, such as e-cigarettes. If you need help quitting, ask your health care provider. Know your numbers. Ask your health care provider to check your cholesterol and your blood sugar (glucose). Continue to have your blood tested as directed by your health care provider. Do I need screening for cancer? Depending on your health history and family history, you may need to have cancer screenings at different stages of your life. This may include screening for: Breast cancer. Cervical cancer. Lung cancer. Colorectal cancer. What is my risk for osteoporosis? After menopause, you may be   at increased risk for osteoporosis. Osteoporosis is a condition in which bone destruction happens more quickly than new bone creation. To help prevent osteoporosis or  the bone fractures that can happen because of osteoporosis, you may take the following actions: If you are 19-50 years old, get at least 1,000 mg of calcium and at least 600 international units (IU) of vitamin D per day. If you are older than age 50 but younger than age 70, get at least 1,200 mg of calcium and at least 600 international units (IU) of vitamin D per day. If you are older than age 70, get at least 1,200 mg of calcium and at least 800 international units (IU) of vitamin D per day. Smoking and drinking excessive alcohol increase the risk of osteoporosis. Eat foods that are rich in calcium and vitamin D, and do weight-bearing exercises several times each week as directed by your health care provider. How does menopause affect my mental health? Depression may occur at any age, but it is more common as you become older. Common symptoms of depression include: Feeling depressed. Changes in sleep patterns. Changes in appetite or eating patterns. Feeling an overall lack of motivation or enjoyment of activities that you previously enjoyed. Frequent crying spells. Talk with your health care provider if you think that you are experiencing any of these symptoms. General instructions See your health care provider for regular wellness exams and vaccines. This may include: Scheduling regular health, dental, and eye exams. Getting and maintaining your vaccines. These include: Influenza vaccine. Get this vaccine each year before the flu season begins. Pneumonia vaccine. Shingles vaccine. Tetanus, diphtheria, and pertussis (Tdap) booster vaccine. Your health care provider may also recommend other immunizations. Tell your health care provider if you have ever been abused or do not feel safe at home. Summary Menopause is a normal process in which your ability to get pregnant comes to an end. This condition causes hot flashes, night sweats, decreased interest in sex, mood swings, headaches, or lack  of sleep. Treatment for this condition may include hormone replacement therapy. Take actions to keep yourself healthy, including exercising regularly, eating a healthy diet, watching your weight, and checking your blood pressure and blood sugar levels. Get screened for cancer and depression. Make sure that you are up to date with all your vaccines. This information is not intended to replace advice given to you by your health care provider. Make sure you discuss any questions you have with your health care provider. Document Revised: 10/14/2020 Document Reviewed: 10/14/2020 Elsevier Patient Education  2023 Else[employer] Inc[employer]  

## 2022-07-01 NOTE — Assessment & Plan Note (Signed)
Encourage diet and exercise for weight loss 

## 2022-07-02 ENCOUNTER — Telehealth (INDEPENDENT_AMBULATORY_CARE_PROVIDER_SITE_OTHER): Payer: Medicare PPO | Admitting: Internal Medicine

## 2022-07-02 ENCOUNTER — Encounter: Payer: Self-pay | Admitting: Internal Medicine

## 2022-07-02 DIAGNOSIS — U071 COVID-19: Secondary | ICD-10-CM | POA: Diagnosis not present

## 2022-07-02 MED ORDER — MOLNUPIRAVIR EUA 200MG CAPSULE: 4.0000 | ORAL_CAPSULE | Freq: Two times a day (BID) | ORAL | 0 refills | Status: AC

## 2022-07-02 NOTE — Progress Notes (Signed)
Virtual Visit via Video Note  I connected with Meghan Cole on 07/02/22 at 11:20 AM EST by a video enabled telemedicine application and verified that I am speaking with the correct person using two identifiers.  Location: Patient: Home Provider: Office  Persons participating in this video call: Nicki Reaper, NP and Nori Riis   I discussed the limitations of evaluation and management by telemedicine and the availability of in person appointments. The patient expressed understanding and agreed to proceed.  History of Present Illness:  Patient reports headache, runny nose, cough and burning sensation in chest.  Her symptoms started last night.  She is blowing clear mucus out of her nose.  The cough is nonproductive.  She denies ear pain, sore throat, shortness of breath, nausea, vomiting or diarrhea.  She reports she felt like she was running a fever but has not checked her temperature.  She denies chills or bodyaches.  She has not taken anything OTC for symptoms.  She tested positive for COVID this morning.  She has had sick contacts diagnosed with COVID.    Past Medical History:  Diagnosis Date   Anxiety 11/25/2015   Atrial septal defect    Chest pain 03/11/2018   Clinical depression 11/25/2015   Gravida 2 para 2 11/25/2015   2.     Heart disease    Hypertension    Thyroid disease     Current Outpatient Medications  Medication Sig Dispense Refill   apixaban (ELIQUIS) 5 MG TABS tablet TAKE 1 TABLET(5 MG) BY MOUTH TWICE DAILY 180 tablet 3   celecoxib (CELEBREX) 100 MG capsule Take 1 capsule (100 mg total) by mouth 2 (two) times daily. 180 capsule 1   cholecalciferol (VITAMIN D) 1000 units tablet Take 1 tablet (1,000 Units total) by mouth daily. (Patient taking differently: Take 2,000 Units by mouth daily.)     conjugated estrogens (PREMARIN) vaginal cream Place vaginally at bedtime. 30 g 5   Cranberry 125 MG TABS Take by mouth.     diphenhydrAMINE (BENADRYL) 12.5 MG chewable tablet  Chew 12.5 mg by mouth as needed for sleep.     hydrochlorothiazide (HYDRODIURIL) 25 MG tablet Take 1 tablet (25 mg total) by mouth daily. Please schedule an appointment before anymore refills. 90 tablet 1   levothyroxine (SYNTHROID) 50 MCG tablet TAKE 1 TABLET BY MOUTH BEFORE BREAKFAST 90 tablet 1   Melatonin-Pyridoxine 5-10 MG TBCR Take by mouth.     metoprolol succinate (TOPROL-XL) 25 MG 24 hr tablet TAKE 1 TABLET BY MOUTH TWICE DAILY. TAKEWITH OR IMMEDIATELY FOLLOWING A MEAL 180 tablet 3   omeprazole (PRILOSEC) 20 MG capsule Take 1 capsule (20 mg total) by mouth daily. 90 capsule 1   rosuvastatin (CRESTOR) 10 MG tablet Take 1 tablet (10 mg total) by mouth daily. 90 tablet 3   No current facility-administered medications for this visit.    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  Denies fever, malaise,  fatigue, or abrupt weight changes.  HEENT: Patient reports runny nose.  Denies eye pain, eye redness, ear pain, ringing in the ears, wax buildup, nasal congestion, bloody nose, or sore throat. Respiratory: Patient reports cough.  Denies difficulty breathing, shortness of breath, or sputum production.   Cardiovascular: Denies chest pain, chest tightness, palpitations or swelling in the hands or feet.  Gastrointestinal: Denies abdominal pain, bloating, constipation, diarrhea or blood in the stool.  GU: Denies urgency, frequency, pain with urination, burning sensation, blood in urine, odor or discharge. Musculoskeletal: Denies decrease in range of motion, difficulty with gait, muscle pain or joint pain and swelling.  Skin: Denies redness, rashes, lesions or ulcercations.  Neurological: Denies dizziness, difficulty with memory, difficulty with speech or problems with balance and coordination.   No other specific complaints in a complete review of systems (except as listed in HPI above).  Observations/Objective:   Wt Readings from Last 3 Encounters:  07/01/22 236 lb (107 kg)  04/01/22 232 lb 6 oz (105.4 kg)  12/23/21 247 lb (112 kg)    General: Appears her stated age, in NAD. HEENT: Nose: No congestion noted; Throat/Mouth: No hoarseness noted Pulmonary/Chest: Normal effort. No respiratory distress.  Neurological: Alert and oriented.   BMET    Component Value Date/Time   NA 139 12/23/2021 0847   NA 139 10/06/2019 0905   K 3.6 12/23/2021 0847   CL 99 12/23/2021 0847   CO2 32 12/23/2021 0847   GLUCOSE 85 12/23/2021 0847   BUN 16 12/23/2021 0847   BUN 10 10/06/2019 0905   CREATININE 0.78 12/23/2021 0847   CALCIUM 9.8 12/23/2021 0847   GFRNONAA 91 12/06/2020 0742   GFRAA 105 12/06/2020 0742    Lipid Panel     Component Value Date/Time   CHOL 135 12/23/2021 0847   CHOL 140 10/06/2019 0905   TRIG 82 12/23/2021 0847   HDL 65 12/23/2021 0847   HDL 70 10/06/2019 0905   CHOLHDL 2.1  12/23/2021 0847   LDLCALC 54 12/23/2021 0847    CBC    Component Value Date/Time   WBC 5.2 12/23/2021 0847   RBC 3.90 12/23/2021 0847   HGB 12.2 12/23/2021 0847   HGB 12.6 10/06/2019 0905   HCT 36.3 12/23/2021 0847   HCT 37.6 10/06/2019 0905   PLT 184 12/23/2021 0847   PLT 185 10/06/2019 0905   MCV 93.1 12/23/2021 0847   MCV 91 10/06/2019 0905   MCH 31.3 12/23/2021 0847   MCHC 33.6 12/23/2021 0847   RDW 13.5 12/23/2021 0847   RDW 13.2 10/06/2019 0905   LYMPHSABS 1.5 10/06/2019 0905   EOSABS 0.1 10/06/2019 0905   BASOSABS 0.1 10/06/2019 0905    Hgb A1C Lab Results  Component Value Date   HGBA1C 5.4 12/06/2020       Assessment and Plan:  COVID-19:  Encourage rest and fluids RX for Molnupiravir 800 mg BID x 5 days (hold rosuvastatin) Can take Tylenol OTC as needed for headache Continue Zyrtec or Flonase OTC as needed for runny nose and cough Quarantine precautions discussed   Follow Up Instructions:    I discussed the assessment and treatment plan with the patient. The patient was provided an opportunity to ask questions and all were answered. The patient agreed with the plan and demonstrated an understanding of the instructions.   The patient was advised to call back or seek an in-person evaluation if the symptoms worsen or if the condition fails to improve as anticipated.    Webb Silversmith, NP

## 2022-07-02 NOTE — Patient Instructions (Signed)

## 2022-07-10 ENCOUNTER — Encounter: Payer: Self-pay | Admitting: Internal Medicine

## 2022-07-10 ENCOUNTER — Other Ambulatory Visit: Payer: Medicare PPO

## 2022-07-10 DIAGNOSIS — E039 Hypothyroidism, unspecified: Secondary | ICD-10-CM | POA: Diagnosis not present

## 2022-07-10 DIAGNOSIS — E782 Mixed hyperlipidemia: Secondary | ICD-10-CM | POA: Diagnosis not present

## 2022-07-10 DIAGNOSIS — R7309 Other abnormal glucose: Secondary | ICD-10-CM | POA: Diagnosis not present

## 2022-07-11 LAB — COMPLETE METABOLIC PANEL WITH GFR
AG Ratio: 1.3 (calc) (ref 1.0–2.5)
ALT: 11 U/L (ref 6–29)
AST: 20 U/L (ref 10–35)
Albumin: 4 g/dL (ref 3.6–5.1)
Alkaline phosphatase (APISO): 74 U/L (ref 37–153)
BUN: 14 mg/dL (ref 7–25)
CO2: 31 mmol/L (ref 20–32)
Calcium: 9.5 mg/dL (ref 8.6–10.4)
Chloride: 100 mmol/L (ref 98–110)
Creat: 0.64 mg/dL (ref 0.50–1.05)
Globulin: 3.1 g/dL (calc) (ref 1.9–3.7)
Glucose, Bld: 89 mg/dL (ref 65–99)
Potassium: 3.5 mmol/L (ref 3.5–5.3)
Sodium: 141 mmol/L (ref 135–146)
Total Bilirubin: 0.9 mg/dL (ref 0.2–1.2)
Total Protein: 7.1 g/dL (ref 6.1–8.1)
eGFR: 96 mL/min/{1.73_m2} (ref >=60)

## 2022-07-11 LAB — HEMOGLOBIN A1C
Hgb A1c MFr Bld: 5.5 % of total Hgb (ref <5.7)
Mean Plasma Glucose: 111 mg/dL
eAG (mmol/L): 6.2 mmol/L

## 2022-07-11 LAB — LIPID PANEL
Cholesterol: 132 mg/dL (ref <200)
HDL: 56 mg/dL (ref >=50)
LDL Cholesterol (Calc): 61 mg/dL (calc)
Non-HDL Cholesterol (Calc): 76 mg/dL (calc) (ref <130)
Total CHOL/HDL Ratio: 2.4 (calc) (ref <5.0)
Triglycerides: 73 mg/dL (ref <150)

## 2022-07-11 LAB — CBC
HCT: 35.9 % (ref 35.0–45.0)
Hemoglobin: 12.2 g/dL (ref 11.7–15.5)
MCH: 30.8 pg (ref 27.0–33.0)
MCHC: 34 g/dL (ref 32.0–36.0)
MCV: 90.7 fL (ref 80.0–100.0)
MPV: 11.1 fL (ref 7.5–12.5)
Platelets: 175 10*3/uL (ref 140–400)
RBC: 3.96 10*6/uL (ref 3.80–5.10)
RDW: 12.6 % (ref 11.0–15.0)
WBC: 5.7 10*3/uL (ref 3.8–10.8)

## 2022-07-11 LAB — TSH: TSH: 1.84 mIU/L (ref 0.40–4.50)

## 2022-07-11 LAB — T4, FREE: Free T4: 1.3 ng/dL (ref 0.8–1.8)

## 2022-07-14 ENCOUNTER — Other Ambulatory Visit: Payer: Self-pay

## 2022-07-14 DIAGNOSIS — L570 Actinic keratosis: Secondary | ICD-10-CM | POA: Diagnosis not present

## 2022-07-14 DIAGNOSIS — L821 Other seborrheic keratosis: Secondary | ICD-10-CM | POA: Diagnosis not present

## 2022-07-14 DIAGNOSIS — L298 Other pruritus: Secondary | ICD-10-CM | POA: Diagnosis not present

## 2022-07-17 DIAGNOSIS — F411 Generalized anxiety disorder: Secondary | ICD-10-CM | POA: Diagnosis not present

## 2022-07-17 DIAGNOSIS — F4381 Prolonged grief disorder: Secondary | ICD-10-CM | POA: Diagnosis not present

## 2022-07-17 DIAGNOSIS — F1021 Alcohol dependence, in remission: Secondary | ICD-10-CM | POA: Diagnosis not present

## 2022-07-28 DIAGNOSIS — G4733 Obstructive sleep apnea (adult) (pediatric): Secondary | ICD-10-CM | POA: Diagnosis not present

## 2022-08-07 DIAGNOSIS — F411 Generalized anxiety disorder: Secondary | ICD-10-CM | POA: Diagnosis not present

## 2022-08-12 ENCOUNTER — Encounter: Payer: Self-pay | Admitting: Internal Medicine

## 2022-08-12 DIAGNOSIS — E039 Hypothyroidism, unspecified: Secondary | ICD-10-CM

## 2022-08-12 DIAGNOSIS — Z1211 Encounter for screening for malignant neoplasm of colon: Secondary | ICD-10-CM

## 2022-08-13 MED ORDER — LEVOTHYROXINE SODIUM 50 MCG PO TABS: ORAL_TABLET | ORAL | 1 refills | Status: DC

## 2022-08-26 DIAGNOSIS — Z1211 Encounter for screening for malignant neoplasm of colon: Secondary | ICD-10-CM | POA: Diagnosis not present

## 2022-09-01 LAB — COLOGUARD: COLOGUARD: NEGATIVE

## 2022-09-03 DIAGNOSIS — F411 Generalized anxiety disorder: Secondary | ICD-10-CM | POA: Diagnosis not present

## 2022-10-08 DIAGNOSIS — F411 Generalized anxiety disorder: Secondary | ICD-10-CM | POA: Diagnosis not present

## 2022-10-14 ENCOUNTER — Ambulatory Visit: Payer: Medicare PPO | Admitting: Internal Medicine

## 2022-10-26 DIAGNOSIS — G4733 Obstructive sleep apnea (adult) (pediatric): Secondary | ICD-10-CM | POA: Diagnosis not present

## 2022-11-11 DIAGNOSIS — L218 Other seborrheic dermatitis: Secondary | ICD-10-CM | POA: Diagnosis not present

## 2022-11-11 DIAGNOSIS — L570 Actinic keratosis: Secondary | ICD-10-CM | POA: Diagnosis not present

## 2022-11-11 DIAGNOSIS — Z86018 Personal history of other benign neoplasm: Secondary | ICD-10-CM | POA: Diagnosis not present

## 2022-11-11 DIAGNOSIS — L718 Other rosacea: Secondary | ICD-10-CM | POA: Diagnosis not present

## 2022-11-11 DIAGNOSIS — Z872 Personal history of diseases of the skin and subcutaneous tissue: Secondary | ICD-10-CM | POA: Diagnosis not present

## 2022-11-11 DIAGNOSIS — D492 Neoplasm of unspecified behavior of bone, soft tissue, and skin: Secondary | ICD-10-CM | POA: Diagnosis not present

## 2022-11-11 DIAGNOSIS — L578 Other skin changes due to chronic exposure to nonionizing radiation: Secondary | ICD-10-CM | POA: Diagnosis not present

## 2022-11-23 NOTE — Progress Notes (Signed)
[facility] [address] [place], [place] [address]  Pulmonary Sleep Medicine   Office Visit Note  Patient Name: Meghan Cole DOB: 1950/11/29 MRN [id number]  I connected with  Ichelle Schoof on 11/24/22 by a video enabled telemedicine application and verified that I am speaking with the correct person using two identifiers.   I discussed the limitations of evaluation and management by telemedicine. The patient expressed understanding and agreed to proceed.   Chief Complaint: Obstructive Sleep Apnea visit  Brief History:  Yoko is seen today for an annual follow up visit for CPAP@ 4.6 cmH2O. The patient has a 5 year history of sleep apnea. Patient is using PAP nightly.  The patient feels rested after sleeping with PAP.  The patient reports benefiting from PAP use. Reported sleepiness is  improved and the Epworth Sleepiness Score is 6 out of 24. The patient does take naps. The patient complains of the following: none.  The compliance download shows 99% compliance with an average use time of 7 hours 19 minutes. The AHI is 3.1. The patient does admit to adjusting her own pressure slightly and was advised against this in the future. The patient does not complain of limb movements disrupting sleep. The patient continues to require PAP therapy in order to eliminate sleep apnea.   ROS  General: (-) fever, (-) chills, (-) night sweat Nose and Sinuses: (-) nasal stuffiness or itchiness, (-) postnasal drip, (-) nosebleeds, (-) sinus trouble. Mouth and Throat: (-) sore throat, (-) hoarseness. Neck: (-) swollen glands, (-) enlarged thyroid, (-) neck pain. Respiratory: - cough, - shortness of breath, - wheezing. Neurologic: - numbness, - tingling. Psychiatric: - anxiety, - depression   Current Medication: Outpatient Encounter Medications as of 11/24/2022  Medication Sig   apixaban (ELIQUIS) 5 MG TABS tablet TAKE 1 TABLET(5 MG) BY MOUTH TWICE DAILY   celecoxib (CELEBREX) 100 MG capsule  Take 1 capsule (100 mg total) by mouth 2 (two) times daily.   cholecalciferol (VITAMIN D) 1000 units tablet Take 1 tablet (1,000 Units total) by mouth daily. (Patient taking differently: Take 2,000 Units by mouth daily.)   conjugated estrogens (PREMARIN) vaginal cream Place vaginally at bedtime.   Cranberry 125 MG TABS Take by mouth.   diphenhydrAMINE (BENADRYL) 12.5 MG chewable tablet Chew 12.5 mg by mouth as needed for sleep.   hydrochlorothiazide (HYDRODIURIL) 25 MG tablet Take 1 tablet (25 mg total) by mouth daily. Please schedule an appointment before anymore refills.   levothyroxine (SYNTHROID) 50 MCG tablet TAKE 1 TABLET BY MOUTH BEFORE BREAKFAST   Melatonin-Pyridoxine 5-10 MG TBCR Take by mouth.   metoprolol succinate (TOPROL-XL) 25 MG 24 hr tablet TAKE 1 TABLET BY MOUTH TWICE DAILY. TAKEWITH OR IMMEDIATELY FOLLOWING A MEAL   omeprazole (PRILOSEC) 20 MG capsule Take 1 capsule (20 mg total) by mouth daily.   rosuvastatin (CRESTOR) 10 MG tablet Take 1 tablet (10 mg total) by mouth daily.   No facility-administered encounter medications on file as of 11/24/2022.    Surgical History: Past Surgical History:  Procedure Laterality Date   ATRIAL SEPTAL DEFECT(ASD) CLOSURE     CARDIOVERSION N/A 10/13/2017   Procedure: CARDIOVERSION;  Surgeon: Antonieta Iba, MD;  Location: [facility] ORS;  Service: Cardiovascular;  Laterality: N/A;   COLONOSCOPY WITH PROPOFOL     2011   EXPLORATION POST OPERATIVE OPEN HEART      Medical History: Past Medical History:  Diagnosis Date   Anxiety 11/25/2015   Atrial septal defect    Chest  pain 03/11/2018   Clinical depression 11/25/2015   Gravida 2 para 2 11/25/2015   2.     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)  Tobacco Use   Smoking status: Former   Smokeless tobacco: Former  Building services engineer Use: Never used  Substance and Sexual Activity   Alcohol use: No   Drug use: No   Sexual activity: Not on file  Other Topics Concern   Not on file  Social History Narrative   Not on file   Social Determinants of Health   Financial Resource Strain: Not on file  Food Insecurity: Not on file  Transportation Needs: Not on file  Physical Activity: Not on file  Stress: Not on file  Social Connections: Not on file  Intimate Partner Violence: Not on file    Vital Signs: Blood pressure 112/70, pulse 76, height 5' 7.5" (1.715 m), weight 220 lb (99.8 kg), SpO2 98 %. Body mass index is 33.95 kg/m.    Examination: General Appearance: The patient is well-developed, well-nourished, and in no distress. Skin: Gross inspection of skin unremarkable. Head: normocephalic, no gross deformities. Eyes: no gross deformities noted. ENT: ears appear grossly normal Neurologic: Alert and oriented. No involuntary movements.  STOP BANG RISK ASSESSMENT S (snore) Have you been told that you snore?     NO   T (tired) Are you often tired, fatigued, or sleepy during the day?   NO  O (obstruction) Do you stop breathing, choke, or gasp during sleep? NO   P (pressure) Do you have or are you being treated for high blood pressure? YES  B (BMI) Is your body index greater than 35 kg/m? NO   A (age) Are you 80 years old or older? YES   N (neck) Do you have a neck circumference greater than 16 inches?   YES/NO   G (gender) Are you a female? NO   TOTAL STOP/BANG "YES" ANSWERS        A STOP-Bang score of 2 or less is considered low risk, and a score of 5 or more is high risk for having either moderate or severe OSA. For people who score 3 or 4, doctors  may need to perform further assessment to determine how likely they are to have OSA.         EPWORTH SLEEPINESS SCALE:  Scale:  (0)= no chance of dozing; (1)= slight chance of dozing; (2)= moderate chance of dozing; (3)= high chance of dozing  Chance  Situtation    Sitting and reading: 1    Watching TV: 1    Sitting Inactive in public: 0    As a passenger in car: 0      Lying down to rest: 3    Sitting and talking: 0    Sitting quielty after lunch: 1    In a car, stopped in traffic: 0   TOTAL SCORE:   6 out of 24    SLEEP STUDIES:  PSG (04/2018) AHI 21/hr, Supine REM  AHI 56/hr, min SpO2 70% Titration (04/2018) CPAP@ 5 cmH2O   CPAP COMPLIANCE DATA:  Date Range: 11/23/2021-11/22/2022  Average Daily Use: 7 hours 19 minutes  Median Use: 7 hours 25 minutes  Compliance for > 4 Hours: 99%  AHI: 3.1 respiratory events per hour  Days Used: 363/365 days  Mask Leak: 9.0  95th Percentile Pressure: 4.6         LABS: No results found for this or any previous visit (from the past 2160 hour(s)).   Radiology: DG Knee Complete 4 Views Left  Result Date: 09/15/2021 CLINICAL DATA:  Chronic pain EXAM: LEFT KNEE - COMPLETE 4+ VIEW COMPARISON:  None. FINDINGS: No fracture or dislocation is seen. Small bony spurs seen in the medial and patellofemoral compartments. There is narrowing of joint space in the medial compartment. There is small effusion in the suprapatellar bursa. IMPRESSION: No recent fracture or dislocation is seen in the left knee. Degenerative changes are noted with bony spurs in the medial and patellofemoral compartments. Small effusion is present in the suprapatellar bursa. Electronically Signed   By: Ernie Avena M.D.   On: 09/15/2021 14:09    No results found.  No results found.    Assessment and Plan: Patient Active Problem List   Diagnosis Date Noted   GERD (gastroesophageal reflux disease) 12/23/2021   Osteoarthritis 12/23/2021    Mixed hyperlipidemia 12/05/2020   Vaginal atrophy 12/05/2020   Hepatitis C virus infection without hepatic coma 03/11/2018   OSA (obstructive sleep apnea) 11/25/2017   Paroxysmal atrial fibrillation (HCC) 12/01/2016   Adult hypothyroidism 11/25/2015   Essential hypertension 11/25/2015   Class 2 obesity due to excess calories with body mass index (BMI) of 36.0 to 36.9 in adult       The patient does tolerate PAP and reports benefit from PAP use. The patient was reminded how to adjust mask and advised to change supplies regularly. The patient was also counselled on nightly use. The compliance is excellent. The AHI is 3.1. Patient continues to require PAP to treat their apnea and is medically necessary.   1. OSA (obstructive sleep apnea) Continue excellent compliance  2. CPAP (continuous positive airway pressure) dependence CPAP couseling-Discussed importance of adequate CPAP use as well as proper care and cleaning techniques of machine and all supplies.  3. Essential hypertension Continue current medication and f/u with PCP.  4. Paroxysmal atrial fibrillation (HCC) Followed by cardiology  5. Adult hypothyroidism Continue current medication and f/u with PCP.  6. Obesity (BMI 30.0-34.9) Obesity Counseling: Had a lengthy discussion regarding patients BMI and weight issues. Patient was instructed on portion control as well as increased activity. Also discussed caloric restrictions with trying to maintain intake less than 2000 Kcal. Discussions were made in accordance with the 5As of weight management. Simple actions such as not eating late and if able to, taking a walk is suggested.    General Counseling: I have discussed the findings of the evaluation and examination with Annabelle Harman.  I have also discussed any further diagnostic evaluation thatmay be needed or ordered today. Andie verbalizes understanding of the findings of todays visit. We also reviewed her medications today and discussed  drug interactions and side effects including but not limited excessive drowsiness and altered mental states. We also discussed that there is always a risk not just to her but also people around her. she has been encouraged to call the office with any questions or concerns that should arise related to todays visit.  No orders of  the defined types were placed in this encounter.       I have personally obtained a history, examined the patient, evaluated laboratory and imaging results, formulated the assessment and plan and placed orders.  This patient was seen by Lynn Ito, PA-C in collaboration with Dr. Freda Munro as a part of collaborative care agreement.  Yevonne Pax, MD [facility] Diplomate ABMS Pulmonary Critical Care Medicine and Sleep Medicine

## 2022-11-24 ENCOUNTER — Ambulatory Visit: Payer: Medicare PPO | Admitting: Internal Medicine

## 2022-11-24 VITALS — BP 112/70 | HR 76 | Ht 67.5 in | Wt 220.0 lb

## 2022-11-24 DIAGNOSIS — I10 Essential (primary) hypertension: Secondary | ICD-10-CM | POA: Diagnosis not present

## 2022-11-24 DIAGNOSIS — Z9989 Dependence on other enabling machines and devices: Secondary | ICD-10-CM

## 2022-11-24 DIAGNOSIS — I480 Paroxysmal atrial fibrillation: Secondary | ICD-10-CM

## 2022-11-24 DIAGNOSIS — E669 Obesity, unspecified: Secondary | ICD-10-CM

## 2022-11-24 DIAGNOSIS — G4733 Obstructive sleep apnea (adult) (pediatric): Secondary | ICD-10-CM

## 2022-11-24 DIAGNOSIS — E039 Hypothyroidism, unspecified: Secondary | ICD-10-CM | POA: Diagnosis not present

## 2022-11-24 NOTE — Patient Instructions (Signed)

## 2022-11-26 DIAGNOSIS — F411 Generalized anxiety disorder: Secondary | ICD-10-CM | POA: Diagnosis not present

## 2022-12-29 ENCOUNTER — Encounter: Payer: Self-pay | Admitting: Internal Medicine

## 2022-12-29 MED ORDER — HYDROCHLOROTHIAZIDE 25 MG PO TABS: 25.0000 mg | ORAL_TABLET | Freq: Every day | ORAL | 0 refills | Status: DC

## 2023-01-21 DIAGNOSIS — F411 Generalized anxiety disorder: Secondary | ICD-10-CM | POA: Diagnosis not present

## 2023-01-26 DIAGNOSIS — G4733 Obstructive sleep apnea (adult) (pediatric): Secondary | ICD-10-CM | POA: Diagnosis not present

## 2023-02-15 DIAGNOSIS — L821 Other seborrheic keratosis: Secondary | ICD-10-CM | POA: Diagnosis not present

## 2023-02-15 DIAGNOSIS — L298 Other pruritus: Secondary | ICD-10-CM | POA: Diagnosis not present

## 2023-02-16 ENCOUNTER — Encounter: Payer: Self-pay | Admitting: Internal Medicine

## 2023-03-09 ENCOUNTER — Ambulatory Visit: Payer: Medicare PPO | Admitting: Internal Medicine

## 2023-03-09 ENCOUNTER — Encounter: Payer: Self-pay | Admitting: Internal Medicine

## 2023-03-09 VITALS — BP 114/78 | HR 65 | Temp 96.8°F | Wt 247.0 lb

## 2023-03-09 DIAGNOSIS — M1712 Unilateral primary osteoarthritis, left knee: Secondary | ICD-10-CM

## 2023-03-09 DIAGNOSIS — E039 Hypothyroidism, unspecified: Secondary | ICD-10-CM | POA: Diagnosis not present

## 2023-03-09 DIAGNOSIS — I480 Paroxysmal atrial fibrillation: Secondary | ICD-10-CM | POA: Diagnosis not present

## 2023-03-09 DIAGNOSIS — E66812 Obesity, class 2: Secondary | ICD-10-CM

## 2023-03-09 DIAGNOSIS — N952 Postmenopausal atrophic vaginitis: Secondary | ICD-10-CM

## 2023-03-09 DIAGNOSIS — K219 Gastro-esophageal reflux disease without esophagitis: Secondary | ICD-10-CM | POA: Diagnosis not present

## 2023-03-09 DIAGNOSIS — B182 Chronic viral hepatitis C: Secondary | ICD-10-CM | POA: Diagnosis not present

## 2023-03-09 DIAGNOSIS — Z6838 Body mass index (BMI) 38.0-38.9, adult: Secondary | ICD-10-CM

## 2023-03-09 DIAGNOSIS — G4733 Obstructive sleep apnea (adult) (pediatric): Secondary | ICD-10-CM | POA: Diagnosis not present

## 2023-03-09 DIAGNOSIS — R739 Hyperglycemia, unspecified: Secondary | ICD-10-CM

## 2023-03-09 DIAGNOSIS — E782 Mixed hyperlipidemia: Secondary | ICD-10-CM | POA: Diagnosis not present

## 2023-03-09 DIAGNOSIS — Z23 Encounter for immunization: Secondary | ICD-10-CM

## 2023-03-09 DIAGNOSIS — I10 Essential (primary) hypertension: Secondary | ICD-10-CM

## 2023-03-09 MED ORDER — ALBUTEROL SULFATE HFA 108 (90 BASE) MCG/ACT IN AERS: 1.0000 | INHALATION_SPRAY | Freq: Four times a day (QID) | RESPIRATORY_TRACT | 0 refills | Status: DC | PRN

## 2023-03-09 MED ORDER — CELECOXIB 100 MG PO CAPS: 100.0000 mg | ORAL_CAPSULE | Freq: Two times a day (BID) | ORAL | 0 refills | Status: DC

## 2023-03-09 MED ORDER — HYDROCHLOROTHIAZIDE 25 MG PO TABS: 25.0000 mg | ORAL_TABLET | Freq: Every day | ORAL | 0 refills | Status: DC

## 2023-03-09 MED ORDER — OMEPRAZOLE 20 MG PO CPDR: 20.0000 mg | DELAYED_RELEASE_CAPSULE | Freq: Every day | ORAL | 0 refills | Status: DC

## 2023-03-09 NOTE — Assessment & Plan Note (Signed)
Encourage weight loss as this can help reduce joint pain Continue Celebrex

## 2023-03-09 NOTE — Assessment & Plan Note (Signed)
In remission.

## 2023-03-09 NOTE — Assessment & Plan Note (Signed)
C-Met and lipid profile today  Encouraged her to consume a low-fat diet Continue rosuvastatin 

## 2023-03-09 NOTE — Progress Notes (Signed)
Subjective:    Patient ID: Meghan Cole, female    DOB: 01-18-1951, 72 y.o.   MRN: [id number]  HPI  Patient presents to clinic today for follow-up of chronic conditions.  HTN: Her BP today is 114/78.  She is taking HCTZ and metoprolol as prescribed.  ECG from 03/2022 reviewed.  Hypothyroidism: She denies any issues on her current dose of levothyroxine.  She does not follow with endocrinology.  OSA: She averages 8 hours of sleep per night with the use of her CPAP.  Sleep study from 08/2018 reviewed.  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  She no longer follows with the liver care clinic.  A-fib: Managed with metoprolol and eliquis as prescribed.  ECG from 03/2022 reviewed.  She follows with cardiology.  HLD: Her last LDL was 61, triglycerides 73, 07/2022.  She denies myalgias on rosuvastatin.  She tries to consume a low-fat diet.  GERD: Triggered by NSAID use.  She denies breakthrough on omeprazole.  There is no upper GI on file.  OA: Mainly in her left knee.  She is taking celebrex as prescribed with good relief of symptoms.  She does not follow with orthopedics.  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  She does not follow with GYN.  Review of Systems     Past Medical History:  Diagnosis Date   Anxiety 11/25/2015   Atrial septal defect    Chest pain 03/11/2018   Clinical depression 11/25/2015   Gravida 2 para 2 11/25/2015   2.     Heart disease    Hypertension    Thyroid disease     Current Outpatient Medications  Medication Sig Dispense Refill   apixaban (ELIQUIS) 5 MG TABS tablet TAKE 1 TABLET(5 MG) BY MOUTH TWICE DAILY 180 tablet 3   celecoxib (CELEBREX) 100 MG capsule Take 1 capsule (100 mg total) by mouth 2 (two) times daily. 180 capsule 1   cholecalciferol (VITAMIN D) 1000 units tablet Take 1 tablet (1,000 Units total) by mouth daily. (Patient taking differently: Take 2,000 Units by mouth daily.)     conjugated estrogens  (PREMARIN) vaginal cream Place vaginally at bedtime. 30 g 5   Cranberry 125 MG TABS Take by mouth.     diphenhydrAMINE (BENADRYL) 12.5 MG chewable tablet Chew 12.5 mg by mouth as needed for sleep.     hydrochlorothiazide (HYDRODIURIL) 25 MG tablet Take 1 tablet (25 mg total) by mouth daily. Please schedule an appointment before anymore refills. 30 tablet 0   levothyroxine (SYNTHROID) 50 MCG tablet TAKE 1 TABLET BY MOUTH BEFORE BREAKFAST 90 tablet 1   Melatonin-Pyridoxine 5-10 MG TBCR Take by mouth.     metoprolol succinate (TOPROL-XL) 25 MG 24 hr tablet TAKE 1 TABLET BY MOUTH TWICE DAILY. TAKEWITH OR IMMEDIATELY FOLLOWING A MEAL 180 tablet 3   omeprazole (PRILOSEC) 20 MG capsule Take 1 capsule (20 mg total) by mouth daily. 90 capsule 1   rosuvastatin (CRESTOR) 10 MG tablet Take 1 tablet (10 mg total) by mouth daily. 90 tablet 3   No current facility-administered medications for this visit.    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)  Tobacco Use   Smoking status: Former   Smokeless tobacco: Former  Building services engineer status: Never Used  Substance and Sexual Activity   Alcohol use: No   Drug use: No   Sexual activity: Not on file  Other Topics Concern   Not on file  Social History Narrative   Not on file   Social Determinants of Health   Financial Resource Strain: Not on file  Food Insecurity: Not on file  Transportation Needs: Not on file  Physical Activity: Not on file  Stress: Not on  file  Social Connections: Not on file  Intimate Partner Violence: Not on file     Constitutional: Denies fever, malaise, fatigue, headache or abrupt weight changes.  HEENT: Denies eye pain, eye redness, ear pain, ringing in the ears, wax buildup, runny nose, nasal congestion, bloody nose, or sore throat. Respiratory: Denies difficulty breathing, shortness of breath, cough or sputum production.   Cardiovascular: Denies chest pain, chest tightness, palpitations or swelling in the hands or feet.  Gastrointestinal: Patient reports intermittent right upper quadrant pain.  Denies bloating, constipation, diarrhea or blood in the stool.  GU: Denies urgency, frequency, pain with urination, burning sensation, blood in urine, odor or discharge. Musculoskeletal: Pt reports left knee pain. Denies decrease in range of motion, difficulty with gait, muscle pain or joint swelling.  Skin: Denies redness, rashes, lesions or ulcercations.  Neurological: Denies dizziness, difficulty with memory, difficulty with speech or problems with balance and coordination.  Psych: Denies anxiety, depression, SI/HI.  No other specific complaints in a complete review of systems (except as listed in HPI above).  Objective:   Physical Exam   BP 114/78 (BP Location: Left Arm, Patient Position: Sitting, Cuff Size: Normal)   Pulse 65   Temp (!) 96.8 F (36 C) (Temporal)   Wt 247 lb (112 kg)   SpO2 95%   BMI 38.11 kg/m   Wt Readings from Last 3 Encounters:  11/24/22 220 lb (99.8 kg)  07/01/22 236 lb (107 kg)  04/01/22 232 lb 6 oz (105.4 kg)    General: Appears her stated age, obese, in NAD. Skin: Warm, dry and intact.  Spider veins noted of bilateral lower extremity. HEENT: Head: normal shape and size; Eyes: sclera white, no icterus, conjunctiva pink, PERRLA and EOMs intact;  Neck:  Neck supple, trachea midline. No masses, lumps or thyromegaly present.  Cardiovascular: Normal rate and rhythm. S1,S2 noted.  No  murmur, rubs or gallops noted. No JVD or BLE edema. No carotid bruits noted. Pulmonary/Chest: Normal effort and positive vesicular breath sounds. No respiratory distress. No wheezes, rales or ronchi noted.  Abdomen: Soft and nontender. Normal bowel sounds.  Musculoskeletal: Normal range of motion. No signs of joint swelling. No difficulty with gait.  Neurological: Alert and oriented. Cranial nerves II-XII grossly intact. Coordination normal.  Psychiatric: Mood and affect normal. Behavior is normal. Judgment and thought content normal.    BMET    Component Value Date/Time   NA 141 07/10/2022 0801   NA 139 10/06/2019 0905   K 3.5 07/10/2022 0801   CL 100 07/10/2022 0801   CO2 31 07/10/2022 0801   GLUCOSE 89 07/10/2022 0801  BUN 14 07/10/2022 0801   BUN 10 10/06/2019 0905   CREATININE 0.64 07/10/2022 0801   CALCIUM 9.5 07/10/2022 0801   GFRNONAA 91 12/06/2020 0742   GFRAA 105 12/06/2020 0742    Lipid Panel     Component Value Date/Time   CHOL 132 07/10/2022 0801   CHOL 140 10/06/2019 0905   TRIG 73 07/10/2022 0801   HDL 56 07/10/2022 0801   HDL 70 10/06/2019 0905   CHOLHDL 2.4 07/10/2022 0801   LDLCALC 61 07/10/2022 0801    CBC    Component Value Date/Time   WBC 5.7 07/10/2022 0801   RBC 3.96 07/10/2022 0801   HGB 12.2 07/10/2022 0801   HGB 12.6 10/06/2019 0905   HCT 35.9 07/10/2022 0801   HCT 37.6 10/06/2019 0905   PLT 175 07/10/2022 0801   PLT 185 10/06/2019 0905   MCV 90.7 07/10/2022 0801   MCV 91 10/06/2019 0905   MCH 30.8 07/10/2022 0801   MCHC 34.0 07/10/2022 0801   RDW 12.6 07/10/2022 0801   RDW 13.2 10/06/2019 0905   LYMPHSABS 1.5 10/06/2019 0905   EOSABS 0.1 10/06/2019 0905   BASOSABS 0.1 10/06/2019 0905    Hgb A1C Lab Results  Component Value Date   HGBA1C 5.5 07/10/2022           Assessment & Plan:      RTC in 3 months for annual exam Nicki Reaper, NP

## 2023-03-09 NOTE — Assessment & Plan Note (Signed)
Continue Premarin

## 2023-03-09 NOTE — Assessment & Plan Note (Signed)
Encouraged diet and exercise for weight loss ?

## 2023-03-09 NOTE — Assessment & Plan Note (Signed)
TSH and free T4 today We will adjust levothyroxine if needed based on labs 

## 2023-03-09 NOTE — Assessment & Plan Note (Signed)
Controlled on HCTZ and metoprolol Reinforced DASH diet and exercise for weight loss C-Met today

## 2023-03-09 NOTE — Patient Instructions (Signed)

## 2023-03-09 NOTE — Assessment & Plan Note (Signed)
Continue metoprolol and Eliquis as prescribed She will continue to follow with cardiology

## 2023-03-09 NOTE — Assessment & Plan Note (Signed)
Avoid foods/medications that trigger reflux Encourage weight loss as this can help reduce reflux symptoms Continue omeprazole

## 2023-03-09 NOTE — Assessment & Plan Note (Signed)
Encourage weight loss as this can help reduce sleep apnea symptoms Continue CPAP use 

## 2023-03-10 ENCOUNTER — Encounter: Payer: Self-pay | Admitting: Internal Medicine

## 2023-03-10 DIAGNOSIS — E039 Hypothyroidism, unspecified: Secondary | ICD-10-CM

## 2023-03-10 LAB — CBC
HCT: 38 % (ref 35.0–45.0)
Hemoglobin: 12.2 g/dL (ref 11.7–15.5)
MCH: 30 pg (ref 27.0–33.0)
MCHC: 32.1 g/dL (ref 32.0–36.0)
MCV: 93.4 fL (ref 80.0–100.0)
MPV: 10.9 fL (ref 7.5–12.5)
Platelets: 164 10*3/uL (ref 140–400)
RBC: 4.07 10*6/uL (ref 3.80–5.10)
RDW: 13.6 % (ref 11.0–15.0)
WBC: 4.6 10*3/uL (ref 3.8–10.8)

## 2023-03-10 LAB — COMPLETE METABOLIC PANEL WITH GFR
AG Ratio: 1.3 (calc) (ref 1.0–2.5)
ALT: 14 U/L (ref 6–29)
AST: 26 U/L (ref 10–35)
Albumin: 4.1 g/dL (ref 3.6–5.1)
Alkaline phosphatase (APISO): 75 U/L (ref 37–153)
BUN: 11 mg/dL (ref 7–25)
CO2: 30 mmol/L (ref 20–32)
Calcium: 9.6 mg/dL (ref 8.6–10.4)
Chloride: 98 mmol/L (ref 98–110)
Creat: 0.8 mg/dL (ref 0.50–1.05)
Globulin: 3.2 g/dL (ref 1.9–3.7)
Glucose, Bld: 84 mg/dL (ref 65–99)
Potassium: 3.6 mmol/L (ref 3.5–5.3)
Sodium: 136 mmol/L (ref 135–146)
Total Bilirubin: 1.3 mg/dL — ABNORMAL HIGH (ref 0.2–1.2)
Total Protein: 7.3 g/dL (ref 6.1–8.1)
eGFR: 80 mL/min/{1.73_m2} (ref >=60)

## 2023-03-10 LAB — HEMOGLOBIN A1C
Hgb A1c MFr Bld: 5.5 %{Hb} (ref <5.7)
Mean Plasma Glucose: 111 mg/dL
eAG (mmol/L): 6.2 mmol/L

## 2023-03-10 LAB — LIPID PANEL
Cholesterol: 124 mg/dL (ref <200)
HDL: 66 mg/dL (ref >=50)
LDL Cholesterol (Calc): 44 mg/dL
Non-HDL Cholesterol (Calc): 58 mg/dL (ref <130)
Total CHOL/HDL Ratio: 1.9 (calc) (ref <5.0)
Triglycerides: 68 mg/dL (ref <150)

## 2023-03-10 LAB — T4, FREE: Free T4: 1.2 ng/dL (ref 0.8–1.8)

## 2023-03-10 LAB — TSH: TSH: 1.81 m[IU]/L (ref 0.40–4.50)

## 2023-03-10 MED ORDER — LEVOTHYROXINE SODIUM 50 MCG PO TABS: ORAL_TABLET | ORAL | 1 refills | Status: DC

## 2023-03-18 DIAGNOSIS — F411 Generalized anxiety disorder: Secondary | ICD-10-CM | POA: Diagnosis not present

## 2023-04-28 DIAGNOSIS — G4733 Obstructive sleep apnea (adult) (pediatric): Secondary | ICD-10-CM | POA: Diagnosis not present

## 2023-05-02 ENCOUNTER — Encounter: Payer: Self-pay | Admitting: Cardiovascular Disease

## 2023-05-02 DIAGNOSIS — E7800 Pure hypercholesterolemia, unspecified: Secondary | ICD-10-CM

## 2023-05-03 MED ORDER — ROSUVASTATIN CALCIUM 10 MG PO TABS: 10.0000 mg | ORAL_TABLET | Freq: Every day | ORAL | 0 refills | Status: DC

## 2023-05-20 DIAGNOSIS — F411 Generalized anxiety disorder: Secondary | ICD-10-CM | POA: Diagnosis not present

## 2023-06-07 ENCOUNTER — Other Ambulatory Visit: Payer: Self-pay | Admitting: Internal Medicine

## 2023-06-11 NOTE — Telephone Encounter (Signed)
 Requested Prescriptions  Pending Prescriptions Disp Refills   hydrochlorothiazide  (HYDRODIURIL ) 25 MG tablet [Pharmacy Med Name: HYDROCHLOROTHIAZIDE  25MG  TABLETS] 90 tablet 0    Sig: TAKE 1 TABLET(25 MG) BY MOUTH DAILY     Cardiovascular: Diuretics - Thiazide Passed - 06/11/2023  3:22 PM      Passed - Cr in normal range and within 180 days    Creat  Date Value Ref Range Status  03/09/2023 0.80 0.50 - 1.05 mg/dL Final         Passed - K in normal range and within 180 days    Potassium  Date Value Ref Range Status  03/09/2023 3.6 3.5 - 5.3 mmol/L Final         Passed - Na in normal range and within 180 days    Sodium  Date Value Ref Range Status  03/09/2023 136 135 - 146 mmol/L Final  10/06/2019 139 134 - 144 mmol/L Final         Passed - Last BP in normal range    BP Readings from Last 1 Encounters:  03/09/23 114/78         Passed - Valid encounter within last 6 months    Recent Outpatient Visits           3 months ago Adult hypothyroidism   [place] [facility] [place], Angeline ORN, NP   11 months ago COVID-19   [place] [facility] [place], Angeline ORN, NP   11 months ago Encounter for general adult medical examination with abnormal findings   [place] [facility] [place], Angeline ORN, NP   1 year ago Adult hypothyroidism   [place] [facility] [place], Angeline ORN, NP   1 year ago Multiple joint pain   [place] [facility] [employer], Angeline ORN, NP       Future Appointments             In 1 week Gollan, Evalene PARAS, MD [facility] [place]   In 3 weeks [place], Angeline ORN, NP [place] [facility]

## 2023-06-14 ENCOUNTER — Other Ambulatory Visit: Payer: Self-pay

## 2023-06-14 ENCOUNTER — Encounter: Payer: Self-pay | Admitting: Internal Medicine

## 2023-06-14 MED ORDER — OMEPRAZOLE 20 MG PO CPDR: 20.0000 mg | DELAYED_RELEASE_CAPSULE | Freq: Every day | ORAL | 0 refills | Status: DC

## 2023-06-21 NOTE — Progress Notes (Signed)
 Cardiology Office Note  Date:  06/22/2023   ID:  Meghan Cole, DOB [birth year], MRN [id number]  PCP:  Antonette Angeline ORN, NP   Chief Complaint  Patient presents with   12 month follow up     Doing well.     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  who presents for follow-up of her permanent atrial fibrillation  LOV 10/23 In follow-up today reports feeling relatively well  Weight trended back up compared to June 2024 Continues to work with her NuStep Despite her efforts, weight continues to trend up Denies chest pain or shortness of breath on exertion  Remains on metoprolol  succinate 25 twice daily Remains in atrial fibrillation with rate control No leg edema, no PND orthopnea  Mother in [place]  died age 79 Lost son in September 12, 2019 CT coronary calcium  scoring 1. Coronary calcium  score of 430.  2.  An occluder device is seen in the interatrial septum.  EKG personally reviewed by myself on todays visit EKG Interpretation Date/Time:  Tuesday June 22 2023 08:23:44 EST Ventricular Rate:  73 PR Interval:    QRS Duration:  94 QT Interval:  390 QTC Calculation: 429 R Axis:   71  Text Interpretation: Atrial fibrillation ST & T wave abnormality, consider anterolateral ischemia When compared with ECG of 13-Oct-2017 07:38, No significant change was found Confirmed by Perla Lye ([id number]) on 06/22/2023 8:29:29 AM     Other past medical history reviewed Echocardiogram October 2019 Left ventricle: The cavity size was normal. Wall thickness was   normal. Systolic function was normal. The estimated ejection   fraction  was in the range of 55% to 60%. Wall motion was normal;   there were no regional wall motion abnormalities. Left   ventricular diastolic function parameters were normal. - Left atrium: The atrium was mildly dilated.   husband with bypass surgery and valve disease  Seen in the office 09/2017 Propafenone  increased up to 150 TID  With metoprolol  succinate 25 BID DCCV Oct 13, 2017  lasted only 6 days  EKG 10/2017: atrial fibrillation at [facility] Propafenone  stopped by [facility] 10/2017 [facility] EP Discussed other options for rhythm control including dofetilide loading vs ablation. Compliant with anticoagulation/eliquis   Did not tolerate flecainide , some nausea  Previous event monitor  Documenting atrial fibrillation Several episodes of atrial fibrillation both on May 19, first episode appeared regular and rapid, second episode or irregular consistent with atrial fibrillation Atrial fib last  Sat, 10/24/2016 10:45 Am, rate 154, up to 174 on tele,  Back to NSR 12:45 Had episode at 5 PM  Father with CAD, MI,  atrial fib  PMH:   has a past medical history of Anxiety (11/25/2015), Atrial septal defect, Chest pain (03/11/2018), Clinical depression (11/25/2015), Gravida 2 para 2 (11/25/2015), Heart disease, Hypertension, and Thyroid  disease.  PSH:    Past Surgical History:  Procedure Laterality Date   ATRIAL SEPTAL DEFECT(ASD) CLOSURE     CARDIOVERSION N/A 10/13/2017   Procedure: CARDIOVERSION;  Surgeon: Perla Lye PARAS, MD;  Location: [facility] ORS;  Service: Cardiovascular;  Laterality: N/A;   COLONOSCOPY WITH PROPOFOL      2011  EXPLORATION POST OPERATIVE OPEN HEART      Current Outpatient Medications  Medication Sig Dispense Refill   albuterol  (VENTOLIN  HFA) 108 (90 Base) MCG/ACT inhaler Inhale 1-2 puffs into the lungs every 6 (six) hours as needed for wheezing or shortness of breath. 8 g 0   celecoxib  (CELEBREX ) 100 MG capsule Take 1 capsule (100 mg total) by mouth 2 (two) times daily. 180 capsule 0    conjugated estrogens  (PREMARIN ) vaginal cream Place vaginally at bedtime. 30 g 5   diphenhydrAMINE (BENADRYL) 12.5 MG chewable tablet Chew 12.5 mg by mouth as needed for sleep.     hydrochlorothiazide  (HYDRODIURIL ) 25 MG tablet TAKE 1 TABLET(25 MG) BY MOUTH DAILY 90 tablet 0   levothyroxine  (SYNTHROID ) 50 MCG tablet TAKE 1 TABLET BY MOUTH BEFORE BREAKFAST 90 tablet 1   Magnesium  100 MG TABS Take 100 mg by mouth daily.     Melatonin-Pyridoxine 5-10 MG TBCR Take by mouth.     omeprazole  (PRILOSEC ) 20 MG capsule Take 1 capsule (20 mg total) by mouth daily. 90 capsule 0   apixaban  (ELIQUIS ) 5 MG TABS tablet TAKE 1 TABLET(5 MG) BY MOUTH TWICE DAILY 180 tablet 3   metoprolol  succinate (TOPROL -XL) 25 MG 24 hr tablet TAKE 1 TABLET BY MOUTH TWICE DAILY. TAKEWITH OR IMMEDIATELY FOLLOWING A MEAL 180 tablet 3   rosuvastatin  (CRESTOR ) 10 MG tablet Take 1 tablet (10 mg total) by mouth daily. 90 tablet 3   No current facility-administered medications for this visit.    Allergies:   Patient has no known allergies.   Social History:  The patient  reports that she has quit smoking. She has quit using smokeless tobacco. She reports that she does not drink alcohol and does not use drugs.   Family History:   family history includes Breast cancer in her maternal grandmother; Cancer in her mother; Heart attack in her father; Heart disease in her father.   Review of Systems: Review of Systems  Constitutional: Negative.   HENT: Negative.    Respiratory: Negative.    Cardiovascular: Negative.   Gastrointestinal: Negative.   Musculoskeletal: Negative.   Neurological: Negative.   Psychiatric/Behavioral: Negative.    All other systems reviewed and are negative.   PHYSICAL EXAM: VS:  BP 120/80 (BP Location: Left Arm, Patient Position: Sitting, Cuff Size: Large)   Pulse 73   Ht 5' 7.5 (1.715 m)   Wt 263 lb 2 oz (119.4 kg)   SpO2 98%   BMI 40.60 kg/m  , BMI Body mass index is 40.6 kg/m. Constitutional:   oriented to person, place, and time. No distress.  HENT:  Head: Grossly normal Eyes:  no discharge. No scleral icterus.  Neck: No JVD, no carotid bruits  Cardiovascular: Regular rate and rhythm, no murmurs appreciated Pulmonary/Chest: Clear to auscultation bilaterally, no wheezes or rails Abdominal: Soft.  no distension.  no tenderness.  Musculoskeletal: Normal range of motion Neurological:  normal muscle tone. Coordination normal. No atrophy Skin: Skin warm and dry Psychiatric: normal affect, pleasant   Recent Labs: 03/09/2023: ALT 14; BUN 11; Creat 0.80; Hemoglobin 12.2; Platelets 164; Potassium 3.6; Sodium 136; TSH 1.81    Lipid Panel Lab Results  Component Value Date   CHOL 124 03/09/2023   HDL 66 03/09/2023   LDLCALC 44 03/09/2023   TRIG 68 03/09/2023    Wt Readings from Last 3 Encounters:  06/22/23 263 lb 2 oz (119.4 kg)  03/09/23 247 lb (112 kg)  11/24/22 220 lb (99.8 kg)  ASSESSMENT AND PLAN:   Permanent atrial fibrillation Dating back to 2017 now permanent  Eliquis  5 twice daily, on metoprolol  succinate 25 twice daily Asymptomatic, good exercise tolerance, reasonable rate control  Chest pain Coronary calcification previously noted, cholesterol at goal Prior smoking history 30 years but stopped 20 years ago Currently with no symptoms of angina. No further workup at this time. Continue current medication regimen.  Essential hypertension - Blood pressure is well controlled on today's visit. No changes made to the medications.  Morbid obesity Previously lost weight through strict diet, had lost her appetite, weight has trended back reports more than 40 pounds since June 2024 We have encouraged continued exercise, careful diet management  Despite her exercise she continues to have difficulty losing weight Given underlying cardiovascular disease, long discussion concerning various treatment options She is interested in starting GLP-1 agonist, prescription  sent in for Zepbound  2.5 with plan to slowly titrate upwards as tolerated  Hyperlipidemia Cholesterol is at goal on the current lipid regimen. No changes to the medications were made.  Continue Crestor  10    Orders Placed This Encounter  Procedures   EKG 12-Lead     Signed, Velinda Lunger, M.D., Ph.D. 06/22/2023  [facility] [place], [place] [phone]

## 2023-06-22 ENCOUNTER — Encounter: Payer: Self-pay | Admitting: Cardiovascular Disease

## 2023-06-22 ENCOUNTER — Ambulatory Visit: Payer: Medicare PPO | Attending: Cardiovascular Disease | Admitting: Cardiovascular Disease

## 2023-06-22 VITALS — BP 120/80 | HR 73 | Ht 67.5 in | Wt 263.1 lb

## 2023-06-22 DIAGNOSIS — Z87891 Personal history of nicotine dependence: Secondary | ICD-10-CM

## 2023-06-22 DIAGNOSIS — I10 Essential (primary) hypertension: Secondary | ICD-10-CM

## 2023-06-22 DIAGNOSIS — I7090 Unspecified atherosclerosis: Secondary | ICD-10-CM

## 2023-06-22 DIAGNOSIS — E782 Mixed hyperlipidemia: Secondary | ICD-10-CM

## 2023-06-22 DIAGNOSIS — G4733 Obstructive sleep apnea (adult) (pediatric): Secondary | ICD-10-CM | POA: Diagnosis not present

## 2023-06-22 DIAGNOSIS — I4821 Permanent atrial fibrillation: Secondary | ICD-10-CM

## 2023-06-22 DIAGNOSIS — E7800 Pure hypercholesterolemia, unspecified: Secondary | ICD-10-CM | POA: Diagnosis not present

## 2023-06-22 MED ORDER — TIRZEPATIDE-WEIGHT MANAGEMENT 5 MG/0.5ML ~~LOC~~ SOLN: 5.0000 mg | SUBCUTANEOUS | 0 refills | Status: DC

## 2023-06-22 MED ORDER — TIRZEPATIDE-WEIGHT MANAGEMENT 2.5 MG/0.5ML ~~LOC~~ SOLN: 2.5000 mg | SUBCUTANEOUS | 0 refills | Status: DC

## 2023-06-22 MED ORDER — METOPROLOL SUCCINATE ER 25 MG PO TB24: ORAL_TABLET | ORAL | 3 refills | Status: DC

## 2023-06-22 MED ORDER — APIXABAN 5 MG PO TABS: ORAL_TABLET | ORAL | 3 refills | Status: DC

## 2023-06-22 MED ORDER — ROSUVASTATIN CALCIUM 10 MG PO TABS: 10.0000 mg | ORAL_TABLET | Freq: Every day | ORAL | 3 refills | Status: DC

## 2023-06-22 NOTE — Patient Instructions (Addendum)
 Medication Instructions:  Zepbound   0.25 weekly, 4 weeks, Then up to 0.5 weekly  If tolerating then please call the office for the next dose.   If you need a refill on your cardiac medications before your next appointment, please call your pharmacy.   Lab work: No new labs needed  Testing/Procedures: No new testing needed  Follow-Up: At [facility], you and your health needs are our priority.  As part of our continuing mission to provide you with exceptional heart care, we have created designated Provider Care Teams.  These Care Teams include your primary Cardiologist (physician) and Advanced Practice Providers (APPs -  Physician Assistants and Nurse Practitioners) who all work together to provide you with the care you need, when you need it.  You will need a follow up appointment in 12 months  Providers on your designated Care Team:   Lonni Meager, NP Bernardino Bring, PA-C Cadence [place], [place]  COVID-19 Vaccine Information can be found at: [email or web address] For questions related to vaccine distribution or appointments, please email [email or web address] or call [phone].

## 2023-06-22 NOTE — Addendum Note (Signed)
 Addended by: Jani Gravel on: 06/22/2023 10:41 AM   Modules accepted: Orders

## 2023-07-06 ENCOUNTER — Ambulatory Visit (INDEPENDENT_AMBULATORY_CARE_PROVIDER_SITE_OTHER): Payer: Medicare PPO | Admitting: Internal Medicine

## 2023-07-06 ENCOUNTER — Encounter: Payer: Self-pay | Admitting: Internal Medicine

## 2023-07-06 VITALS — BP 128/78 | Ht 67.5 in | Wt 246.0 lb

## 2023-07-06 DIAGNOSIS — N952 Postmenopausal atrophic vaginitis: Secondary | ICD-10-CM | POA: Diagnosis not present

## 2023-07-06 DIAGNOSIS — Z0001 Encounter for general adult medical examination with abnormal findings: Secondary | ICD-10-CM

## 2023-07-06 DIAGNOSIS — Z6837 Body mass index (BMI) 37.0-37.9, adult: Secondary | ICD-10-CM

## 2023-07-06 DIAGNOSIS — E66812 Obesity, class 2: Secondary | ICD-10-CM

## 2023-07-06 DIAGNOSIS — E039 Hypothyroidism, unspecified: Secondary | ICD-10-CM | POA: Diagnosis not present

## 2023-07-06 MED ORDER — ALBUTEROL SULFATE HFA 108 (90 BASE) MCG/ACT IN AERS: 1.0000 | INHALATION_SPRAY | Freq: Four times a day (QID) | RESPIRATORY_TRACT | 1 refills | Status: AC | PRN

## 2023-07-06 MED ORDER — HYDROCHLOROTHIAZIDE 25 MG PO TABS: ORAL_TABLET | ORAL | 1 refills | Status: DC

## 2023-07-06 MED ORDER — PREMARIN 0.625 MG/GM VA CREA: TOPICAL_CREAM | Freq: Every day | VAGINAL | 5 refills | Status: AC

## 2023-07-06 MED ORDER — CELECOXIB 100 MG PO CAPS: 100.0000 mg | ORAL_CAPSULE | Freq: Two times a day (BID) | ORAL | 1 refills | Status: DC

## 2023-07-06 MED ORDER — OMEPRAZOLE 20 MG PO CPDR: 20.0000 mg | DELAYED_RELEASE_CAPSULE | Freq: Every day | ORAL | 1 refills | Status: DC

## 2023-07-06 NOTE — Patient Instructions (Signed)
Health Maintenance for Postmenopausal Women Menopause is a normal process in which your ability to get pregnant comes to an end. This process happens slowly over many months or years, usually between the ages of 53 and 48. Menopause is complete when you have missed your menstrual period for 12 months. It is important to talk with your health care provider about some of the most common conditions that affect women after menopause (postmenopausal women). These include heart disease, cancer, and bone loss (osteoporosis). Adopting a healthy lifestyle and getting preventive care can help to promote your health and wellness. The actions you take can also lower your chances of developing some of these common conditions. What are the signs and symptoms of menopause? During menopause, you may have the following symptoms: Hot flashes. These can be moderate or severe. Night sweats. Decrease in sex drive. Mood swings. Headaches. Tiredness (fatigue). Irritability. Memory problems. Problems falling asleep or staying asleep. Talk with your health care provider about treatment options for your symptoms. Do I need hormone replacement therapy? Hormone replacement therapy is effective in treating symptoms that are caused by menopause, such as hot flashes and night sweats. Hormone replacement carries certain risks, especially as you become older. If you are thinking about using estrogen or estrogen with progestin, discuss the benefits and risks with your health care provider. How can I reduce my risk for heart disease and stroke? The risk of heart disease, heart attack, and stroke increases as you age. One of the causes may be a change in the body's hormones during menopause. This can affect how your body uses dietary fats, triglycerides, and cholesterol. Heart attack and stroke are medical emergencies. There are many things that you can do to help prevent heart disease and stroke. Watch your blood pressure High  blood pressure causes heart disease and increases the risk of stroke. This is more likely to develop in people who have high blood pressure readings or are overweight. Have your blood pressure checked: Every 3-5 years if you are 81-82 years of age. Every year if you are 70 years old or older. Eat a healthy diet  Eat a diet that includes plenty of vegetables, fruits, low-fat dairy products, and lean protein. Do not eat a lot of foods that are high in solid fats, added sugars, or sodium. Get regular exercise Get regular exercise. This is one of the most important things you can do for your health. Most adults should: Try to exercise for at least 150 minutes each week. The exercise should increase your heart rate and make you sweat (moderate-intensity exercise). Try to do strengthening exercises at least twice each week. Do these in addition to the moderate-intensity exercise. Spend less time sitting. Even light physical activity can be beneficial. Other tips Work with your health care provider to achieve or maintain a healthy weight. Do not use any products that contain nicotine or tobacco. These products include cigarettes, chewing tobacco, and vaping devices, such as e-cigarettes. If you need help quitting, ask your health care provider. Know your numbers. Ask your health care provider to check your cholesterol and your blood sugar (glucose). Continue to have your blood tested as directed by your health care provider. Do I need screening for cancer? Depending on your health history and family history, you may need to have cancer screenings at different stages of your life. This may include screening for: Breast cancer. Cervical cancer. Lung cancer. Colorectal cancer. What is my risk for osteoporosis? After menopause, you may be  at increased risk for osteoporosis. Osteoporosis is a condition in which bone destruction happens more quickly than new bone creation. To help prevent osteoporosis or  the bone fractures that can happen because of osteoporosis, you may take the following actions: If you are 25-40 years old, get at least 1,000 mg of calcium and at least 600 international units (IU) of vitamin D per day. If you are older than age 7 but younger than age 34, get at least 1,200 mg of calcium and at least 600 international units (IU) of vitamin D per day. If you are older than age 41, get at least 1,200 mg of calcium and at least 800 international units (IU) of vitamin D per day. Smoking and drinking excessive alcohol increase the risk of osteoporosis. Eat foods that are rich in calcium and vitamin D, and do weight-bearing exercises several times each week as directed by your health care provider. How does menopause affect my mental health? Depression may occur at any age, but it is more common as you become older. Common symptoms of depression include: Feeling depressed. Changes in sleep patterns. Changes in appetite or eating patterns. Feeling an overall lack of motivation or enjoyment of activities that you previously enjoyed. Frequent crying spells. Talk with your health care provider if you think that you are experiencing any of these symptoms. General instructions See your health care provider for regular wellness exams and vaccines. This may include: Scheduling regular health, dental, and eye exams. Getting and maintaining your vaccines. These include: Influenza vaccine. Get this vaccine each year before the flu season begins. Pneumonia vaccine. Shingles vaccine. Tetanus, diphtheria, and pertussis (Tdap) booster vaccine. Your health care provider may also recommend other immunizations. Tell your health care provider if you have ever been abused or do not feel safe at home. Summary Menopause is a normal process in which your ability to get pregnant comes to an end. This condition causes hot flashes, night sweats, decreased interest in sex, mood swings, headaches, or lack  of sleep. Treatment for this condition may include hormone replacement therapy. Take actions to keep yourself healthy, including exercising regularly, eating a healthy diet, watching your weight, and checking your blood pressure and blood sugar levels. Get screened for cancer and depression. Make sure that you are up to date with all your vaccines. This information is not intended to replace advice given to you by your health care provider. Make sure you discuss any questions you have with your health care provider. Document Revised: 10/14/2020 Document Reviewed: 10/14/2020 Elsevier Patient Education  2024 [employer].

## 2023-07-06 NOTE — Assessment & Plan Note (Signed)
Encouraged diet and exercise for weight loss ?

## 2023-07-06 NOTE — Progress Notes (Signed)
Subjective:    Patient ID: Meghan Cole, female    DOB: 17-Aug-1950, 73 y.o.   MRN: [id number]  HPI  Patient presents to clinic today for her annual exam.  Flu: 03/2023 Tetanus: unsure COVID: X 4 Pneumovax: 06/2021 Prevnar: 02/2019 Shingrix: 10/2016, 12/2016 Pap smear: 11/2017 Mammogram: 10/2019 Bone density: never Colon screening: 08/2022, Cologuard Vision screening: annually Dentist: biannually  Diet: She does eat meat. She consumes fruits and veggies. She does eat some fried foods. She drinks mostly water. Exercise: Elliptical, walking  Review of Systems    Past Medical History:  Diagnosis Date   Anxiety 11/25/2015   Atrial septal defect    Chest pain 03/11/2018   Clinical depression 11/25/2015   Gravida 2 para 2 11/25/2015   2.     Heart disease    Hypertension    Thyroid disease     Current Outpatient Medications  Medication Sig Dispense Refill   albuterol (VENTOLIN HFA) 108 (90 Base) MCG/ACT inhaler Inhale 1-2 puffs into the lungs every 6 (six) hours as needed for wheezing or shortness of breath. 8 g 0   apixaban (ELIQUIS) 5 MG TABS tablet TAKE 1 TABLET(5 MG) BY MOUTH TWICE DAILY 180 tablet 3   celecoxib (CELEBREX) 100 MG capsule Take 1 capsule (100 mg total) by mouth 2 (two) times daily. 180 capsule 0   conjugated estrogens (PREMARIN) vaginal cream Place vaginally at bedtime. 30 g 5   diphenhydrAMINE (BENADRYL) 12.5 MG chewable tablet Chew 12.5 mg by mouth as needed for sleep.     hydrochlorothiazide (HYDRODIURIL) 25 MG tablet TAKE 1 TABLET(25 MG) BY MOUTH DAILY 90 tablet 0   levothyroxine (SYNTHROID) 50 MCG tablet TAKE 1 TABLET BY MOUTH BEFORE BREAKFAST 90 tablet 1   Magnesium 100 MG TABS Take 100 mg by mouth daily.     Melatonin-Pyridoxine 5-10 MG TBCR Take by mouth.     metoprolol succinate (TOPROL-XL) 25 MG 24 hr tablet TAKE 1 TABLET BY MOUTH TWICE DAILY. TAKEWITH OR IMMEDIATELY FOLLOWING A MEAL 180 tablet 3   omeprazole (PRILOSEC) 20 MG capsule Take 1 capsule  (20 mg total) by mouth daily. 90 capsule 0   rosuvastatin (CRESTOR) 10 MG tablet Take 1 tablet (10 mg total) by mouth daily. 90 tablet 3   tirzepatide (ZEPBOUND) 2.5 MG/0.5ML injection vial Inject 2.5 mg into the skin once a week. 2 mL 0   tirzepatide (ZEPBOUND) 2.5 MG/0.5ML injection vial Inject 2.5 mg into the skin once a week. 2 mL 0   tirzepatide 5 MG/0.5ML injection vial Inject 5 mg into the skin once a week. 2 mL 0   tirzepatide 5 MG/0.5ML injection vial Inject 5 mg into the skin once a week. 2 mL 0   No current facility-administered medications for this visit.    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)  Tobacco Use   Smoking  status: Former   Smokeless tobacco: Former  Building services engineer status: Never Used  Substance and Sexual Activity   Alcohol use: No   Drug use: No   Sexual activity: Not on file  Other Topics Concern   Not on file  Social History Narrative   Not on file   Social Drivers of Health   Financial Resource Strain: Not on file  Food Insecurity: Not on file  Transportation Needs: Not on file  Physical Activity: Not on file  Stress: Not on file  Social Connections: Not on file  Intimate Partner Violence: Not on file     Constitutional: Denies fever, malaise, fatigue, headache or abrupt weight changes.  HEENT: Denies eye pain, eye redness, ear pain, ringing in the ears, wax buildup, runny nose, nasal congestion, bloody nose, or sore throat. Respiratory: Denies  difficulty breathing, shortness of breath, cough or sputum production.   Cardiovascular: Denies chest pain, chest tightness, palpitations or swelling in the hands or feet.  Gastrointestinal: Denies abdominal pain, bloating, constipation, diarrhea or blood in the stool.  GU: Pt reports vaginal dryness. Denies urgency, frequency, pain with urination, burning sensation, blood in urine, odor or discharge. Musculoskeletal: Patient reports joint pain.  Denies decrease in range of motion, difficulty with gait, muscle pain or joint swelling.  Skin: Denies redness, rashes, lesions or ulcercations.  Neurological: Denies dizziness, difficulty with memory, difficulty with speech or problems with balance and coordination.  Psych: Denies anxiety, depression, SI/HI.  No other specific complaints in a complete review of systems (except as listed in HPI above).  Objective:   Physical Exam  BP 128/78 (BP Location: Left Arm, Patient Position: Sitting, Cuff Size: Large)   Ht 5' 7.5" (1.715 m)   Wt 246 lb (111.6 kg)   BMI 37.96 kg/m    Wt Readings from Last 3 Encounters:  06/22/23 263 lb 2 oz (119.4 kg)  03/09/23 247 lb (112 kg)  11/24/22 220 lb (99.8 kg)    General: Appears her stated age, obese, in NAD. Skin: Warm, dry and intact. No rashes, lesions or ulcerations noted. HEENT: Head: normal shape and size; Eyes: sclera white, no icterus, conjunctiva pink, PERRLA and EOMs intact;  Neck:  Neck supple, trachea midline. No masses, lumps or thyromegaly present.  Cardiovascular: Normal rate and rhythm. S1,S2 noted.  No murmur, rubs or gallops noted. No JVD or BLE edema. No carotid bruits noted. Pulmonary/Chest: Normal effort and positive vesicular breath sounds. No respiratory distress. No wheezes, rales or ronchi noted.  Abdomen: Normal bowel sounds.  Musculoskeletal: Strength 5/5 BUE/BLE.  No difficulty with gait.  Neurological: Alert and oriented. Cranial nerves II-XII grossly intact. Coordination  normal.  Psychiatric: Mood and affect normal. Behavior is normal. Judgment and thought content normal.    BMET    Component Value Date/Time   NA 136 03/09/2023 0906   NA 139 10/06/2019 0905   K 3.6 03/09/2023 0906   CL 98 03/09/2023 0906   CO2 30 03/09/2023 0906   GLUCOSE 84 03/09/2023 0906   BUN 11 03/09/2023 0906   BUN 10 10/06/2019 0905   CREATININE 0.80 03/09/2023 0906   CALCIUM 9.6 03/09/2023 0906   GFRNONAA 91 12/06/2020 0742   GFRAA 105 12/06/2020 0742    Lipid Panel     Component Value Date/Time   CHOL 124 03/09/2023 0906   CHOL 140 10/06/2019 0905   TRIG 68 03/09/2023 0906   HDL 66 03/09/2023 0906   HDL 70 10/06/2019 0905  CHOLHDL 1.9 03/09/2023 0906   LDLCALC 44 03/09/2023 0906    CBC    Component Value Date/Time   WBC 4.6 03/09/2023 0906   RBC 4.07 03/09/2023 0906   HGB 12.2 03/09/2023 0906   HGB 12.6 10/06/2019 0905   HCT 38.0 03/09/2023 0906   HCT 37.6 10/06/2019 0905   PLT 164 03/09/2023 0906   PLT 185 10/06/2019 0905   MCV 93.4 03/09/2023 0906   MCV 91 10/06/2019 0905   MCH 30.0 03/09/2023 0906   MCHC 32.1 03/09/2023 0906   RDW 13.6 03/09/2023 0906   RDW 13.2 10/06/2019 0905   LYMPHSABS 1.5 10/06/2019 0905   EOSABS 0.1 10/06/2019 0905   BASOSABS 0.1 10/06/2019 0905    Hgb A1C Lab Results  Component Value Date   HGBA1C 5.5 03/09/2023           Assessment & Plan:   Preventative Health Maintenance:  Flu shot UTD She declines tetanus for financial reasons, advised if she gets better could go get this done at the pharmacy Encouraged her to get her COVID booster Pneumovax and Prevnar UTD Shingrix UTD She no longer wants to screen for cervical cancer She does not want to screen for breast cancer She does not want to screen for osteoporosis Colon screening UTD Encouraged her to consume a balanced diet and exercise regimen Advised her seeing eye doctor and dentist annually We will check  TSH and Free T4 today  RTC in 6 months,  follow-up chronic conditions Nicki Reaper, NP

## 2023-07-07 ENCOUNTER — Encounter: Payer: Self-pay | Admitting: Internal Medicine

## 2023-07-07 LAB — T4, FREE: Free T4: 1.5 ng/dL (ref 0.8–1.8)

## 2023-07-07 LAB — TSH: TSH: 1.25 m[IU]/L (ref 0.40–4.50)

## 2023-07-08 DIAGNOSIS — F411 Generalized anxiety disorder: Secondary | ICD-10-CM | POA: Diagnosis not present

## 2023-07-13 ENCOUNTER — Other Ambulatory Visit: Payer: Self-pay | Admitting: Cardiovascular Disease

## 2023-07-13 NOTE — Telephone Encounter (Signed)
 Refill request

## 2023-07-15 ENCOUNTER — Encounter: Payer: Self-pay | Admitting: Internal Medicine

## 2023-07-15 ENCOUNTER — Ambulatory Visit: Payer: Medicare PPO | Admitting: Internal Medicine

## 2023-07-15 VITALS — BP 118/80 | Ht 67.5 in | Wt 242.4 lb

## 2023-07-15 DIAGNOSIS — R350 Frequency of micturition: Secondary | ICD-10-CM | POA: Diagnosis not present

## 2023-07-15 DIAGNOSIS — N3000 Acute cystitis without hematuria: Secondary | ICD-10-CM

## 2023-07-15 LAB — POCT URINE DIPSTICK
Glucose, UA: NEGATIVE mg/dL
Ketones, POC UA: NEGATIVE mg/dL
Nitrite, UA: POSITIVE — AB
Spec Grav, UA: 1.01 (ref 1.010–1.025)
Urobilinogen, UA: 0.2 U/dL
pH, UA: 6.5 (ref 5.0–8.0)

## 2023-07-15 MED ORDER — NITROFURANTOIN MONOHYD MACRO 100 MG PO CAPS: 100.0000 mg | ORAL_CAPSULE | Freq: Two times a day (BID) | ORAL | 0 refills | Status: DC

## 2023-07-15 NOTE — Progress Notes (Signed)
 HPI   Discussed the use of AI scribe software for clinical note transcription with the patient, who gave verbal consent to proceed.  Meghan Cole is a 73 year old female who presents with urinary symptoms including pressure and urgency.  She has been experiencing urinary symptoms for the past couple of days, characterized by pressure and urgency. There is no dysuria, hematuria, or vaginal symptoms. She tested positive for leukocytes at home, indicating a possible urinary tract infection.    Review of Systems  Past Medical History:  Diagnosis Date   Anxiety 11/25/2015   Atrial septal defect    Chest pain 03/11/2018   Clinical depression 11/25/2015   Gravida 2 para 2 11/25/2015   2.     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)  Tobacco Use   Smoking status: Former   Smokeless tobacco: Former  Building Services Engineer status: Never Used  Substance and Sexual Activity   Alcohol use: No   Drug use: No   Sexual activity: Not on file  Other Topics Concern   Not on file  Social History Narrative   Not on file   Social Drivers of Corporate Investment Banker Strain: Not on file  Food Insecurity: Not on file  Transportation Needs: Not on file  Physical Activity: Not on file  Stress: Not on file  Social Connections: Not on file  Intimate Partner Violence: Not on file    No Known Allergies   Constitutional: Denies fever, malaise, fatigue, headache or abrupt  weight changes.   GU: Pt reports bladder pressure, urgency. Denies burning sensation, frequency, dysuria, blood in urine, odor or discharge. Skin: Denies redness, rashes, lesions or ulcercations.   No other specific complaints in a complete review of systems (except as listed in HPI above).    Objective:   Physical Exam  BP 118/80 (BP Location: Left Arm, Patient Position: Sitting, Cuff Size: Large)   Ht 5' 7.5 (1.715 m)   Wt 242 lb 6.4 oz (110 kg)   BMI 37.40 kg/m   Wt Readings from Last 3 Encounters:  07/06/23 246 lb (111.6 kg)  06/22/23 263 lb 2 oz (119.4 kg)  03/09/23 247 lb (112 kg)    General: Appears her stated age, obese, in NAD. Cardiovascular: Normal rate and rhythm. S1,S2 noted.   Pulmonary/Chest: Normal effort and positive vesicular breath sounds. No respiratory distress. No wheezes, rales or ronchi noted.  Abdomen: Soft. Normal bowel sounds. No distention or masses noted.  Tender to palpation over the bladder area. No CVA tenderness.        Assessment & Plan:   Urgency, Frequency, Dysuria secondary to   Urinalysis: positive Will send urine culture eRx sent if for Macrobid  100 mg BID x 5 days OK to take AZO OTC Drink  plenty of fluids  RTC in 5 months for follow-up of chronic conditions Meghan Laura, NP

## 2023-07-15 NOTE — Patient Instructions (Signed)

## 2023-07-18 LAB — URINE CULTURE
MICRO NUMBER:: 16051949
SPECIMEN QUALITY:: ADEQUATE

## 2023-07-19 ENCOUNTER — Encounter: Payer: Self-pay | Admitting: Internal Medicine

## 2023-07-29 ENCOUNTER — Telehealth: Payer: Self-pay | Admitting: Pharmacy Technician

## 2023-07-29 ENCOUNTER — Encounter: Payer: Self-pay | Admitting: Cardiovascular Disease

## 2023-07-29 ENCOUNTER — Other Ambulatory Visit (HOSPITAL_COMMUNITY): Payer: Self-pay

## 2023-07-29 DIAGNOSIS — G4733 Obstructive sleep apnea (adult) (pediatric): Secondary | ICD-10-CM | POA: Diagnosis not present

## 2023-07-29 NOTE — Telephone Encounter (Signed)
 Pharmacy Patient Advocate Encounter   Received notification from Physician's Office that prior authorization for zepbound is required/requested.   Insurance verification completed.   The patient is insured through [employer] .   Per test claim: PA required; PA submitted to above mentioned insurance via CoverMyMeds Key/confirmation #/EOC [facility] Status is pending

## 2023-07-29 NOTE — Telephone Encounter (Signed)
 Pharmacy Patient Advocate Encounter  Received notification from [facility] that Prior Authorization for zepbound has been APPROVED from 06/09/23 to 06/07/24. Ran test claim, Copay is $64.00- one month. This test claim was processed through [facility]- copay amounts may vary at other pharmacies due to pharmacy/plan contracts, or as the patient moves through the different stages of their insurance plan.   PA #/Case ID/Reference #: [id number]

## 2023-07-30 MED ORDER — ZEPBOUND 5 MG/0.5ML ~~LOC~~ SOAJ
5.0000 mg | SUBCUTANEOUS | 0 refills | Status: DC
Start: 2023-07-30 — End: 2023-08-24

## 2023-07-30 NOTE — Addendum Note (Signed)
 Addended by: Cheree Ditto on: 07/30/2023 01:00 PM   Modules accepted: Orders

## 2023-08-02 ENCOUNTER — Other Ambulatory Visit: Payer: Self-pay | Admitting: Cardiovascular Disease

## 2023-08-02 NOTE — Telephone Encounter (Signed)
 Refill requested

## 2023-08-24 ENCOUNTER — Other Ambulatory Visit: Payer: Self-pay | Admitting: Cardiovascular Disease

## 2023-09-17 ENCOUNTER — Other Ambulatory Visit: Payer: Self-pay | Admitting: Internal Medicine

## 2023-09-17 ENCOUNTER — Other Ambulatory Visit: Payer: Self-pay | Admitting: Cardiovascular Disease

## 2023-09-17 DIAGNOSIS — E039 Hypothyroidism, unspecified: Secondary | ICD-10-CM

## 2023-09-17 NOTE — Telephone Encounter (Signed)
 Requested Prescriptions  Pending Prescriptions Disp Refills   levothyroxine (SYNTHROID) 50 MCG tablet [Pharmacy Med Name: LEVOTHYROXINE 0.05MG  ( ) TAB] 90 tablet 1    Sig: TAKE 1 TABLET BY MOUTH BEFORE BREAKFAST     Endocrinology:  Hypothyroid Agents Passed - 09/17/2023  3:36 PM      Passed - TSH in normal range and within 360 days    TSH  Date Value Ref Range Status  07/06/2023 1.25 0.40 - 4.50 mIU/L Final         Passed - Valid encounter within last 12 months    Recent Outpatient Visits           2 months ago Acute cystitis without hematuria   [facility] [place], Salvadore Oxford, NP       Future Appointments             In 3 months Baity, Salvadore Oxford, NP [place] [facility], [facility]

## 2023-09-18 NOTE — Progress Notes (Signed)
 [facility] [address] [place], [place] [address]  Pulmonary Sleep Medicine   Office Visit Note  Patient Name: Meghan Cole DOB: 31-May-1951 MRN [id number]    Chief Complaint: Obstructive Sleep Apnea visit  Brief History:  Shaima is seen today for follow up on CPAP @ 5cmH20. The patient has a 5 year history of sleep apnea. Patient is using PAP nightly.  The patient feels rested after sleeping with PAP.  The patient reports benefiting from PAP use. Reported sleepiness is  improved and the Epworth Sleepiness Score is 7 out of 24. The patient does take naps daily. The patient complains of the following: Patient is in need of a replacement unit as her current unit has reached maximum motor lifetime expectancy and is out of warranty. The compliance download shows 99% compliance with an average use time of 7 hours and 26 minutes. The AHI is 2.6  The patient does not complain of limb movements disrupting sleep. The patient continues to require PAP therapy in order to eliminate sleep apnea.  ROS  General: (-) fever, (-) chills, (-) night sweat Nose and Sinuses: (-) nasal stuffiness or itchiness, (-) postnasal drip, (-) nosebleeds, (-) sinus trouble. Mouth and Throat: (-) sore throat, (-) hoarseness. Neck: (-) swollen glands, (-) enlarged thyroid, (-) neck pain. Respiratory: - cough, - shortness of breath, - wheezing. Neurologic: - numbness, - tingling. Psychiatric: - anxiety, - depression   Current Medication: Outpatient Encounter Medications as of 09/20/2023  Medication Sig   albuterol (VENTOLIN HFA) 108 (90 Base) MCG/ACT inhaler Inhale 1-2 puffs into the lungs every 6 (six) hours as needed for wheezing or shortness of breath.   apixaban (ELIQUIS) 5 MG TABS tablet TAKE 1 TABLET(5 MG) BY MOUTH TWICE DAILY   celecoxib (CELEBREX) 100 MG capsule Take 1 capsule (100 mg total) by mouth 2 (two) times daily.   conjugated estrogens (PREMARIN) vaginal cream Place vaginally at bedtime.    diphenhydrAMINE (BENADRYL) 12.5 MG chewable tablet Chew 12.5 mg by mouth as needed for sleep.   hydrochlorothiazide (HYDRODIURIL) 25 MG tablet TAKE 1 TABLET(25 MG) BY MOUTH DAILY   levothyroxine (SYNTHROID) 50 MCG tablet TAKE 1 TABLET BY MOUTH BEFORE BREAKFAST   Magnesium 100 MG TABS Take 100 mg by mouth daily.   Melatonin-Pyridoxine 5-10 MG TBCR Take by mouth.   metoprolol succinate (TOPROL-XL) 25 MG 24 hr tablet TAKE 1 TABLET BY MOUTH TWICE DAILY. TAKEWITH OR IMMEDIATELY FOLLOWING A MEAL   omeprazole (PRILOSEC) 20 MG capsule Take 1 capsule (20 mg total) by mouth daily.   rosuvastatin (CRESTOR) 10 MG tablet Take 1 tablet (10 mg total) by mouth daily.   tirzepatide (ZEPBOUND) 10 MG/0.5ML Pen Inject 10 mg into the skin once a week.   [DISCONTINUED] nitrofurantoin, macrocrystal-monohydrate, (MACROBID) 100 MG capsule Take 1 capsule (100 mg total) by mouth 2 (two) times daily.   [DISCONTINUED] tirzepatide (ZEPBOUND) 7.5 MG/0.5ML Pen Inject 7.5 mg into the skin once a week.   No facility-administered encounter medications on file as of 09/20/2023.    Surgical History: Past Surgical History:  Procedure Laterality Date   ATRIAL SEPTAL DEFECT(ASD) CLOSURE     CARDIOVERSION N/A 10/13/2017   Procedure: CARDIOVERSION;  Surgeon: Devorah Fonder, MD;  Location: [facility] ORS;  Service: Cardiovascular;  Laterality: N/A;   COLONOSCOPY WITH PROPOFOL     2011   EXPLORATION POST OPERATIVE OPEN HEART      Medical History: Past Medical History:  Diagnosis Date   Anxiety 11/25/2015   Atrial septal  defect    Chest pain 03/11/2018   Clinical depression 11/25/2015   Gravida 2 para 2 11/25/2015   2.     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)  Tobacco Use   Smoking status: Former   Smokeless tobacco: Former  Building services engineer status: Never Used  Substance and Sexual Activity   Alcohol use: No   Drug use: No   Sexual activity: Not on file  Other Topics Concern   Not on file  Social History Narrative   Not on file   Social Drivers of Corporate investment banker Strain: Not on file  Food Insecurity: Not on file  Transportation Needs: Not on file  Physical Activity: Not on file  Stress: Not on file  Social Connections: Not on file  Intimate Partner Violence: Not on file    Vital Signs: Blood pressure 128/85, pulse (!) 58, height 5\' 7"  (1.702 m). Body mass index is 37.97 kg/m.    Examination: General Appearance: The patient is well-developed, well-nourished, and in no distress. Neck Circumference: 39 cm Skin: Gross inspection of skin unremarkable. Head: normocephalic, no gross deformities. Eyes: no gross deformities noted. ENT: ears appear grossly normal Neurologic: Alert and oriented. No involuntary movements.  STOP BANG RISK ASSESSMENT S (snore) Have you been told that you snore?     NO   T (tired) Are you often tired, fatigued, or sleepy during the day?   NO  O (obstruction) Do you stop breathing, choke, or gasp during sleep? NO   P (pressure) Do you have or are you being treated for high blood pressure? YES   B (BMI) Is your body index greater than 35 kg/m? YES   A (age) Are you 41 years old or older? YES   N (neck) Do you have a neck circumference greater than 16 inches?   NO   G (gender) Are you a female? NO   TOTAL STOP/BANG "YES" ANSWERS 3       A STOP-Bang score of 2 or less is considered low risk, and a score of 5 or more is high risk for having either moderate or severe OSA. For people who score 3 or 4, doctors may need to perform further  assessment to determine how likely they are to have OSA.         EPWORTH SLEEPINESS SCALE:  Scale:  (0)= no chance of dozing; (1)= slight chance of dozing; (2)= moderate chance of dozing; (3)= high chance of dozing  Chance  Situtation    Sitting and reading: 2    Watching TV: 1    Sitting Inactive in public: 0    As a passenger in car: 0      Lying down to rest: 3    Sitting and talking: 0    Sitting quielty after lunch: 1    In a car, stopped in traffic: 0   TOTAL SCORE:   7 out of 24    SLEEP STUDIES:  PSG - 04/2018 -  AHI 21/hr, Supine REM AHI 56/hr, min Sp02 70% Titration - 04/2018 - CPAP @ 5cmH20   CPAP COMPLIANCE DATA:  Date Range: 09/16/2022 - 09/15/2023  Average Daily Use: 7 hours 26 minutes  Median Use: 7 hours 34 minutes  Compliance for > 4 Hours: 97% days  AHI: 2.6 respiratory events per hour  Days Used: 365/365  Mask Leak: 15.2  95th Percentile Pressure: 5cmH20         LABS: Recent Results (from the past 2160 hours)  TSH     Status: None   Collection Time: 07/06/23  8:34 AM  Result Value Ref Range   TSH 1.25 0.40 - 4.50 mIU/L  T4, free     Status: None   Collection Time: 07/06/23  8:34 AM  Result Value Ref Range   Free T4 1.5 0.8 - 1.8 ng/dL  POCT URINE DIPSTICK     Status: Abnormal   Collection Time: 07/15/23 11:52 AM  Result Value Ref Range   Color, UA yellow yellow   Clarity, UA cloudy (A) clear   Glucose, UA negative negative mg/dL   Bilirubin, UA small (A) negative   Ketones, POC UA negative negative mg/dL   Spec Grav, UA 9.147 8.295 - 1.025   Blood, UA large (A) negative   pH, UA 6.5 5.0 - 8.0   POC PROTEIN,UA trace negative, trace   Urobilinogen, UA 0.2 0.2 or 1.0 E.U./dL   Nitrite, UA Positive (A) Negative   Leukocytes, UA Moderate (2+) (A) Negative  Urine Culture     Status: Abnormal   Collection Time: 07/15/23  1:06 PM   Specimen: Urine  Result Value Ref Range   MICRO NUMBER: [id number]    SPECIMEN QUALITY:  Adequate    Sample Source URINE, CLEAN CATCH    STATUS: FINAL    ISOLATE 1: Citrobacter koseri (A)     Comment: Greater than 100,000 CFU/mL of Citrobacter koseri      Susceptibility   Citrobacter koseri - URINE CULTURE, REFLEX    AMOX/CLAVULANIC <=2 Sensitive     CEFAZOLIN* <=4 Not Reportable      * For infections other than uncomplicated UTI caused by E. coli, K. pneumoniae or P. mirabilis: Cefazolin is resistant if MIC > or = 8 mcg/mL. (Distinguishing susceptible versus intermediate for isolates with MIC < or = 4 mcg/mL requires additional testing.) For uncomplicated UTI caused by E. coli, K. pneumoniae or P. mirabilis: Cefazolin is susceptible if MIC <32 mcg/mL and predicts susceptible to the oral agents cefaclor, cefdinir, cefpodoxime, cefprozil, cefuroxime, cephalexin and loracarbef.     CEFTAZIDIME <=1 Sensitive     CEFEPIME <=1 Sensitive     CEFTRIAXONE <=1 Sensitive     CIPROFLOXACIN <=0.25 Sensitive     LEVOFLOXACIN <=0.12 Sensitive     GENTAMICIN <=1 Sensitive     IMIPENEM <=0.25 Sensitive     NITROFURANTOIN 32 Sensitive     PIP/TAZO <=4 Sensitive     TOBRAMYCIN <=1 Sensitive     TRIMETH/SULFA* <=20 Sensitive      * For infections other than uncomplicated UTI caused by E. coli, K. pneumoniae or P. mirabilis: Cefazolin is resistant if MIC > or = 8 mcg/mL. (Distinguishing susceptible versus intermediate for isolates with MIC < or = 4 mcg/mL requires additional testing.) For uncomplicated UTI caused by E. coli, K. pneumoniae or P. mirabilis: Cefazolin is susceptible if MIC <32 mcg/mL and predicts susceptible to the oral agents cefaclor, cefdinir, cefpodoxime, cefprozil, cefuroxime, cephalexin and loracarbef. Legend: S = Susceptible  I = Intermediate R = Resistant  NS = Not susceptible SDD = Susceptible Dose Dependent * = Not Tested  NR = Not Reported **NN = See Therapy Comments     Radiology: DG Knee Complete 4 Views Left Result Date: 09/15/2021 CLINICAL  DATA:  Chronic pain EXAM: LEFT KNEE - COMPLETE 4+ VIEW COMPARISON:  None. FINDINGS: No fracture or dislocation is seen. Small bony spurs seen in the medial and patellofemoral compartments. There is narrowing of joint space in the medial compartment. There is small effusion in the suprapatellar bursa. IMPRESSION: No recent fracture or dislocation is seen in the left knee. Degenerative changes are noted with bony spurs in the medial and patellofemoral compartments. Small effusion is present in the suprapatellar bursa. Electronically Signed   By: Craven Do M.D.   On: 09/15/2021 14:09    No results found.  No results found.    Assessment and Plan: Patient Active Problem List   Diagnosis Date Noted   GERD (gastroesophageal reflux disease) 12/23/2021   Osteoarthritis 12/23/2021   Mixed hyperlipidemia 12/05/2020   Vaginal atrophy 12/05/2020   Hepatitis C virus infection without hepatic coma 03/11/2018   OSA (obstructive sleep apnea) 11/25/2017   Paroxysmal atrial fibrillation (HCC) 12/01/2016   Adult hypothyroidism 11/25/2015   Essential hypertension 11/25/2015   Class 2 obesity due to excess calories with body mass index (BMI) of 37.0 to 37.9 in adult    1. OSA (obstructive sleep apnea) (Primary) The patient does tolerate PAP and reports  benefit from PAP use. Machine is past the end of usable life and out of warranty and obsolete for repair and must be replaced. The patient was reminded how to clean equipment and advised to replace supplies routinely. The patient was also counselled on weight loss. The compliance is excellent. The AHI is 2.6.   OSA on cpap- controlled.  Replace machine. Continue with excellent compliance with pap. CPAP continues to be medically necessary to treat this patient's OSA. F/u after set up.   2. CPAP (continuous positive airway pressure) dependence CPAP Counseling: had a lengthy discussion with the patient regarding the importance of PAP therapy in  management of the sleep apnea. Patient appears to understand the risk factor reduction and also understands the risks associated with untreated sleep apnea. Patient will try to make a good faith effort to remain compliant with therapy. Also instructed the patient on proper cleaning of the device including the water must be changed daily if possible and use of distilled water is preferred. Patient understands that the machine should be regularly cleaned with appropriate recommended cleaning solutions that do not damage the PAP machine for example given white vinegar and water rinses. Other methods such as ozone treatment may not be as good as these simple methods to achieve cleaning.   3. Essential hypertension Controlled with metoprolol and hydrochlorothiazide. Continue.     General Counseling: I have discussed the findings of the evaluation and examination with Mel.  I have also discussed any further diagnostic evaluation thatmay be needed or ordered today. Maurice verbalizes understanding of the findings of todays visit. We also reviewed her medications today and discussed drug interactions and side effects including but not limited excessive drowsiness and altered mental states. We also discussed that there is always a risk not just to her but also people around her. she has been encouraged to call the office with any questions or concerns that should arise related to todays visit.  No orders of the defined types  were placed in this encounter.       I have personally obtained a history, examined the patient, evaluated laboratory and imaging results, formulated the assessment and plan and placed orders. This patient was seen today by Louann Rous, PA-C in collaboration with Dr. Cam Cava.   Cordie Deters, MD [facility] Diplomate ABMS Pulmonary Critical Care Medicine and Sleep Medicine

## 2023-09-20 ENCOUNTER — Ambulatory Visit (INDEPENDENT_AMBULATORY_CARE_PROVIDER_SITE_OTHER): Admitting: Internal Medicine

## 2023-09-20 VITALS — BP 128/85 | HR 58 | Ht 67.0 in

## 2023-09-20 DIAGNOSIS — I10 Essential (primary) hypertension: Secondary | ICD-10-CM

## 2023-09-20 DIAGNOSIS — G4733 Obstructive sleep apnea (adult) (pediatric): Secondary | ICD-10-CM

## 2023-09-20 DIAGNOSIS — Z9989 Dependence on other enabling machines and devices: Secondary | ICD-10-CM | POA: Diagnosis not present

## 2023-09-20 NOTE — Patient Instructions (Signed)

## 2023-09-23 ENCOUNTER — Other Ambulatory Visit: Payer: Self-pay | Admitting: Cardiovascular Disease

## 2023-10-23 ENCOUNTER — Encounter: Payer: Self-pay | Admitting: Cardiovascular Disease

## 2023-10-25 MED ORDER — ZEPBOUND 10 MG/0.5ML ~~LOC~~ SOAJ: 10.0000 mg | SUBCUTANEOUS | 0 refills | Status: DC

## 2023-11-22 ENCOUNTER — Other Ambulatory Visit: Payer: Self-pay

## 2023-11-22 ENCOUNTER — Encounter: Payer: Self-pay | Admitting: Cardiovascular Disease

## 2023-11-22 DIAGNOSIS — G4733 Obstructive sleep apnea (adult) (pediatric): Secondary | ICD-10-CM

## 2023-11-22 NOTE — Progress Notes (Unsigned)
 Dose increase - pended for review

## 2023-11-23 MED ORDER — TIRZEPATIDE-WEIGHT MANAGEMENT 12.5 MG/0.5ML ~~LOC~~ SOAJ: 12.5000 mg | SUBCUTANEOUS | 0 refills | Status: DC

## 2023-11-23 NOTE — Addendum Note (Signed)
 Addended by: Sunny English on: 11/23/2023 04:37 PM   Modules accepted: Orders

## 2023-12-17 ENCOUNTER — Encounter: Payer: Self-pay | Admitting: Internal Medicine

## 2023-12-17 ENCOUNTER — Ambulatory Visit: Admitting: Family Medicine

## 2023-12-17 ENCOUNTER — Encounter: Payer: Self-pay | Admitting: Family Medicine

## 2023-12-17 VITALS — BP 122/78 | HR 66 | Ht 67.0 in | Wt 219.5 lb

## 2023-12-17 DIAGNOSIS — N3001 Acute cystitis with hematuria: Secondary | ICD-10-CM | POA: Diagnosis not present

## 2023-12-17 DIAGNOSIS — R300 Dysuria: Secondary | ICD-10-CM | POA: Diagnosis not present

## 2023-12-17 LAB — POCT URINALYSIS DIPSTICK
Bilirubin, UA: NEGATIVE
Glucose, UA: NEGATIVE
Ketones, UA: NEGATIVE
Nitrite, UA: NEGATIVE
Odor: POSITIVE
Protein, UA: NEGATIVE
Spec Grav, UA: 1.01 (ref 1.010–1.025)
Urobilinogen, UA: 0.2 U/dL
pH, UA: 5 (ref 5.0–8.0)

## 2023-12-17 MED ORDER — NITROFURANTOIN MONOHYD MACRO 100 MG PO CAPS: 100.0000 mg | ORAL_CAPSULE | Freq: Two times a day (BID) | ORAL | 0 refills | Status: DC

## 2023-12-17 NOTE — Patient Instructions (Addendum)
 Thank you for coming to the office today.  1. You have a Urinary Tract Infection - this is very common, your symptoms are reassuring and you should get better within 1 week on the antibiotics - Start Macrobid  100mg  2 times daily for next 5 days, complete entire course, even if feeling better - We sent urine for a culture, we will call you within next few days if we need to change antibiotics - Please drink plenty of fluids, improve hydration over next 1 week  If symptoms worsening, developing nausea / vomiting, worsening back pain, fevers / chills / sweats, then please return for re-evaluation sooner.  If you take AZO OTC - limit this to 2-3 days MAX to avoid affecting kidneys  D-Mannose is a natural supplement that can actually help bind to urinary bacteria and reduce their effectiveness it can help prevent UTI from forming, and may reduce some symptoms. It likely cannot cure an active UTI but it is worth a try and good to prevent them with. Try 500mg  twice a day at a full dose if you want, or check package instructions for more info   Please schedule a Follow-up Appointment to: Return if symptoms worsen or fail to improve.  If you have any other questions or concerns, please feel free to call the office or send a message through MyChart. You may also schedule an earlier appointment if necessary.  Additionally, you may be receiving a survey about your experience at our office within a few days to 1 week by e-mail or mail. We value your feedback.  Marsa Officer, DO [facility], [place]

## 2023-12-17 NOTE — Progress Notes (Signed)
 Subjective:    Patient ID: Meghan Cole, female    DOB: 1950-11-04, 73 y.o.   MRN: [id number]  Meghan Cole is a 73 y.o. female presenting on 12/17/2023 for Dysuria (Started today)  Patient presents for a same day appointment.  PCP [name], FNP   HPI  Discussed the use of AI scribe software for clinical note transcription with the patient, who gave verbal consent to proceed.  History of Present Illness   Meghan Cole is a 73 year old female who presents with urinary burning and fatigue.  Suspected UTI Dysuria - Urinary burning present, frequency. She did home urine test that was abnormal - History of urinary tract infection in February 2025 treated with Macrobid  and it resolved - Previous infection identified as Citrobacter, sensitive to all treatments - Uses Azo for urinary symptom relief      12/17/2023    4:03 PM 07/06/2023    8:20 AM 03/09/2023    9:07 AM  Depression screen PHQ 2/9  Decreased Interest 0 0 0  Down, Depressed, Hopeless 0 0 0  PHQ - 2 Score 0 0 0       12/17/2023    4:03 PM 07/06/2023    8:20 AM 03/09/2023    9:07 AM 07/01/2022    9:29 AM  GAD 7 : Generalized Anxiety Score  Nervous, Anxious, on Edge 0 0 0 0  Control/stop worrying 0 0 0 0  Worry too much - different things 0 0 0 0  Trouble relaxing 0 0 0 0  Restless 0 0 0 0  Easily annoyed or irritable 0 0 0 0  Afraid - awful might happen 0 0 0 0  Total GAD 7 Score 0 0 0 0  Anxiety Difficulty  Not difficult at all Not difficult at all Not difficult at all    Social History   Tobacco Use   Smoking status: Former   Smokeless tobacco: Former  Building services engineer status: Never Used  Substance Use Topics   Alcohol use: No   Drug use: No    Review of Systems Per HPI unless specifically indicated above     Objective:    BP 122/78 (BP Location: Right Arm, Patient Position: Sitting, Cuff Size: Normal)   Pulse 66   Ht 5' 7 (1.702 m)   Wt 219 lb 8 oz (99.6 kg)   SpO2 99%   BMI 34.38  kg/m   Wt Readings from Last 3 Encounters:  12/17/23 219 lb 8 oz (99.6 kg)  07/15/23 242 lb 6.4 oz (110 kg)  07/06/23 246 lb (111.6 kg)    Physical Exam Vitals and nursing note reviewed.  Constitutional:      General: She is not in acute distress.    Appearance: Normal appearance. She is well-developed. She is not diaphoretic.     Comments: Well-appearing, comfortable, cooperative  HENT:     Head: Normocephalic and atraumatic.  Eyes:     General:        Right eye: No discharge.        Left eye: No discharge.     Conjunctiva/sclera: Conjunctivae normal.  Cardiovascular:     Rate and Rhythm: Normal rate.  Pulmonary:     Effort: Pulmonary effort is normal.  Skin:    General: Skin is warm and dry.     Findings: No erythema or rash.  Neurological:     Mental Status: She is alert and oriented to person, place, and time.  Psychiatric:        Mood and Affect: Mood normal.        Behavior: Behavior normal.        Thought Content: Thought content normal.     Comments: Well groomed, good eye contact, normal speech and thoughts     Results for orders placed or performed in visit on 12/17/23  POCT Urinalysis Dipstick   Collection Time: 12/17/23  4:00 PM  Result Value Ref Range   Color, UA yellow    Clarity, UA clear    Glucose, UA Negative Negative   Bilirubin, UA negative    Ketones, UA negative    Spec Grav, UA 1.010 1.010 - 1.025   Blood, UA small    pH, UA 5.0 5.0 - 8.0   Protein, UA Negative Negative   Urobilinogen, UA 0.2 0.2 or 1.0 E.U./dL   Nitrite, UA negative    Leukocytes, UA Trace (A) Negative   Appearance     Odor positive       Assessment & Plan:   Problem List Items Addressed This Visit   None Visit Diagnoses       Dysuria    -  Primary   Relevant Orders   POCT Urinalysis Dipstick (Completed)   Urine Culture        Urinary Tract Infection (UTI) Urinalysis indicates UTI. Previous UTI with Citrobacter. Sensitive to all Previously Macrobid   preferred for treatment. Will agree to repeat course. Also offered alternative with Keflex  if still has symptoms or resistance pattern  - Prescribe Macrobid  100 mg twice daily for 5 days. - Send urine culture to confirm diagnosis and adjust treatment if necessary. - Advise to monitor MyChart for culture results and report any concerns. - Limit Azo use to 2-3 days for urinary relief. - Consider D-mannose supplement for urinary health and prevention of recurrent UTIs.        Orders Placed This Encounter  Procedures   Urine Culture   POCT Urinalysis Dipstick    No orders of the defined types were placed in this encounter.   Follow up plan: No follow-ups on file.  Marsa Officer, DO [facility] 12/17/2023, 4:18 PM

## 2023-12-18 LAB — URINE CULTURE
MICRO NUMBER:: 16689455
SPECIMEN QUALITY:: ADEQUATE

## 2023-12-20 ENCOUNTER — Ambulatory Visit: Payer: Self-pay | Admitting: Internal Medicine

## 2024-01-02 ENCOUNTER — Encounter: Payer: Self-pay | Admitting: Cardiovascular Disease

## 2024-01-02 DIAGNOSIS — G4733 Obstructive sleep apnea (adult) (pediatric): Secondary | ICD-10-CM

## 2024-01-03 ENCOUNTER — Other Ambulatory Visit: Payer: Self-pay | Admitting: *Deleted

## 2024-01-03 DIAGNOSIS — G4733 Obstructive sleep apnea (adult) (pediatric): Secondary | ICD-10-CM

## 2024-01-03 MED ORDER — TIRZEPATIDE-WEIGHT MANAGEMENT 12.5 MG/0.5ML ~~LOC~~ SOAJ: 12.5000 mg | SUBCUTANEOUS | 5 refills | Status: DC

## 2024-01-04 ENCOUNTER — Encounter: Payer: Self-pay | Admitting: Internal Medicine

## 2024-01-04 ENCOUNTER — Ambulatory Visit: Payer: Self-pay | Admitting: Internal Medicine

## 2024-01-04 VITALS — BP 122/74 | Ht 67.0 in | Wt 217.6 lb

## 2024-01-04 DIAGNOSIS — K219 Gastro-esophageal reflux disease without esophagitis: Secondary | ICD-10-CM | POA: Diagnosis not present

## 2024-01-04 DIAGNOSIS — M1712 Unilateral primary osteoarthritis, left knee: Secondary | ICD-10-CM | POA: Diagnosis not present

## 2024-01-04 DIAGNOSIS — R739 Hyperglycemia, unspecified: Secondary | ICD-10-CM

## 2024-01-04 DIAGNOSIS — Z1231 Encounter for screening mammogram for malignant neoplasm of breast: Secondary | ICD-10-CM

## 2024-01-04 DIAGNOSIS — N952 Postmenopausal atrophic vaginitis: Secondary | ICD-10-CM | POA: Diagnosis not present

## 2024-01-04 DIAGNOSIS — E039 Hypothyroidism, unspecified: Secondary | ICD-10-CM

## 2024-01-04 DIAGNOSIS — I480 Paroxysmal atrial fibrillation: Secondary | ICD-10-CM | POA: Diagnosis not present

## 2024-01-04 DIAGNOSIS — G4733 Obstructive sleep apnea (adult) (pediatric): Secondary | ICD-10-CM | POA: Diagnosis not present

## 2024-01-04 DIAGNOSIS — E782 Mixed hyperlipidemia: Secondary | ICD-10-CM

## 2024-01-04 DIAGNOSIS — B182 Chronic viral hepatitis C: Secondary | ICD-10-CM | POA: Diagnosis not present

## 2024-01-04 DIAGNOSIS — I10 Essential (primary) hypertension: Secondary | ICD-10-CM | POA: Diagnosis not present

## 2024-01-04 DIAGNOSIS — I700 Atherosclerosis of aorta: Secondary | ICD-10-CM | POA: Insufficient documentation

## 2024-01-04 DIAGNOSIS — E66811 Obesity, class 1: Secondary | ICD-10-CM

## 2024-01-04 MED ORDER — OMEPRAZOLE 20 MG PO CPDR: 20.0000 mg | DELAYED_RELEASE_CAPSULE | Freq: Every day | ORAL | 1 refills | Status: AC

## 2024-01-04 MED ORDER — CELECOXIB 100 MG PO CAPS: 100.0000 mg | ORAL_CAPSULE | Freq: Two times a day (BID) | ORAL | 1 refills | Status: DC

## 2024-01-04 NOTE — Assessment & Plan Note (Signed)
 Encouraged diet and exercise for weight loss Continue tirzepatide  12.5 weekly as previously prescribed

## 2024-01-04 NOTE — Assessment & Plan Note (Signed)
 C-Met and lipid profile today Encouraged her to consume a low-fat diet Continue rosuvastatin  10 mg daily

## 2024-01-04 NOTE — Assessment & Plan Note (Addendum)
 Continue premarin  cream at bedtime Would consider weaning this to 3 x week

## 2024-01-04 NOTE — Assessment & Plan Note (Addendum)
 Encourage weight loss as this can help reduce joint pain Continue celebrex  100 mg BID, has tried to stop this medication in the past but was unsuccessful Would consider weaning this to 50 mg BID

## 2024-01-04 NOTE — Patient Instructions (Signed)
 Atherosclerosis  Atherosclerosis is when plaque builds up in the arteries. This causes narrowing and hardening of the arteries. Arteries are blood vessels that carry blood from the heart to all parts of the body. This blood contains oxygen. Plaque occurs due to inflammation or from a buildup of fat, cholesterol, calcium, waste products of cells, and a clotting material in the blood (fibrin). Plaque decreases the amount of blood that can flow through the artery. Atherosclerosis can affect any artery in your body, including: Heart arteries. Damage to these arteries may lead to coronary artery disease, which can cause a heart attack. Brain arteries. Damage to these arteries may cause a stroke. Leg, arm, and pelvis arteries. Peripheral artery disease (PAD) may result from damage to these arteries. Kidney arteries. Kidney (renal) failure may result from damage to kidney arteries. Treatment may slow the disease and prevent further damage to your heart, brain, peripheral arteries, and kidneys. What are the causes? This condition develops slowly over many years. The inner layers of your arteries become damaged and allow the gradual buildup of plaque. The exact cause of atherosclerosis is not fully understood. Symptoms of atherosclerosis do not occur until an artery becomes narrow or blocked. What increases the risk? The following factors may make you more likely to develop this condition: Being middle-aged or older. Certain medical conditions, including: High blood pressure. High cholesterol. High blood fats (triglycerides). Diabetes. Sleep apnea. Obesity. Certain lab levels, including: Elevated C-reactive protein (CRP). This is a sign of increased inflammation in your body. Elevated homocysteine levels. This is an amino acid that is associated with heart and blood vessel disease. Using tobacco or nicotine products. A family history of atherosclerosis. Not exercising enough (sedentary  lifestyle). Being stressed. Drinking too much alcohol or using drugs, such as cocaine or methamphetamine. What are the signs or symptoms? Symptoms of atherosclerosis do not occur until the plaque severely narrows or blocks the artery, which decreases blood flow. Sometimes, atherosclerosis does not cause symptoms. Symptoms of this condition include: Coronary artery disease. This may cause chest pain and shortness of breath. Decreased blood supply to your brain, which may cause a stroke. Signs of a stroke may include sudden: Weakness or numbness in your face, arm, or leg, especially on one side of your body. Trouble walking or difficulty moving your arms or legs. Loss of balance or coordination. Confusion. Slurred speech. Trouble speaking, or trouble understanding speech, or both (aphasia). Vision changes in one or both eyes. This may be double vision, blurred vision, or loss of vision. Severe headache with no known cause. The headache is often described as the worst headache ever experienced. PAD, which may cause pain, numbness, or nonhealing wounds, often in your legs and hips. Renal failure. This may cause tiredness, problems with urination, swelling, and itchy skin. How is this diagnosed? This condition is diagnosed based on your medical history and a physical exam. During the exam, your health care provider will: Check your pulse in different places. Listen for a "whooshing" sound over your arteries (bruit). You may also have tests, such as: Blood tests to check your levels of cholesterol, triglycerides, blood sugar, and CRP. Ankle-brachial index to compare blood pressure in your arms to blood pressure in your ankles to see how your blood is flowing. Heart (cardiac) tests. Electrocardiogram (ECG) to check for heart damage. Stress test to see how your heart reacts to exercise. Ultrasound tests. Ultrasound of your peripheral arteries to check blood flow. Echocardiogram to get images of  your heart's  chambers and valves. X-ray tests. Chest X-ray to see if you have an enlarged heart, which is a sign of heart failure. CT scan to check for damage to your heart, brain, or arteries. Angiogram. This is a test where dye is injected and X-rays are used to see the blood flow in the arteries. How is this treated? This condition is treated with lifestyle changes as the first step. These may include: Changing your diet. Losing weight. Reducing stress. Exercising and being physically active more regularly. Quitting smoking. You may also need medicine to: Lower triglycerides and cholesterol. Control blood pressure. Prevent blood clots. Lower inflammation in your body. Control your blood sugar. Sometimes, surgery is needed to: Remove plaque from an artery (endarterectomy). Open or widen a narrowed heart artery or peripheral artery (angioplasty). Create a new path for your blood with one of these procedures: Heart (coronary) artery bypass graft surgery. Peripheral artery bypass graft surgery. Place a small mesh tube (stent) in an artery to open or widen a narrowed artery. Follow these instructions at home: Eating and drinking  Eat a heart-healthy diet. Talk with your health care provider or a dietitian if you need help. A heart-healthy diet involves: Limiting unhealthy fats and increasing healthy fats. Some examples of healthy fats are avocados and olive oil. Eating plant-based foods, such as fruits, vegetables, nuts, whole grains, and legumes (such as peas and lentils). If you drink alcohol: Limit how much you have to: 0-1 drink a day for women who are not pregnant. 0-2 drinks a day for men. Know how much alcohol is in a drink. In the [place] one drink equals one 12 oz bottle of beer (355 mL), one 5 oz glass of wine (148 mL), or one 1 oz glass of hard liquor (44 mL). Lifestyle  Maintain a healthy weight. Lose weight if your health care provider says that you need to do  that. Follow an exercise program as told by your health care provider. Do not use any products that contain nicotine or tobacco. These products include cigarettes, chewing tobacco, and vaping devices, such as e-cigarettes. If you need help quitting, ask your health care provider. Do not use drugs. General instructions Take over-the-counter and prescription medicines only as told by your health care provider. Manage other health conditions as told. Keep all follow-up visits. This is important. Contact a health care provider if you have: An irregular heartbeat. Unexplained tiredness (fatigue). Trouble urinating, or you are producing less urine or foamy urine. Swelling of your hands or feet, or itchy skin. Unexplained pain or numbness in your legs or hips. A wound that is slow to heal or is not healing. Get help right away if: You have any symptoms of a heart attack. These may be: Chest pain. This includes squeezing chest pain that may feel like indigestion (angina). Shortness of breath. Pain in your neck, jaw, arms, back, or stomach. Cold sweat. Nausea. Light-headedness. Sudden pain, numbness, or coldness in a limb. You have any symptoms of a stroke. "BE FAST" is an easy way to remember the main warning signs of a stroke: B - Balance. Signs are dizziness, sudden trouble walking, or loss of balance. E - Eyes. Signs are trouble seeing or a sudden change in vision. F - Face. Signs are sudden weakness or numbness of the face, or the face or eyelid drooping on one side. A - Arms. Signs are weakness or numbness in an arm. This happens suddenly and usually on one side of the body. S -  Speech. Signs are sudden trouble speaking, slurred speech, or trouble understanding what people say. T - Time. Time to call emergency services. Write down what time symptoms started. You have other signs of a stroke, such as: A sudden, severe headache with no known cause. Nausea or vomiting. Seizure. These  symptoms may represent a serious problem that is an emergency. Do not wait to see if the symptoms will go away. Get medical help right away. Call your local emergency services (911 in the [place] Do not drive yourself to the hospital. Summary Atherosclerosis is when plaque builds up in the arteries and causes narrowing and hardening of the arteries. Plaque occurs due to inflammation or from a buildup of fat, cholesterol, calcium, cellular waste products, and fibrin. This condition may not cause any symptoms. Symptoms of atherosclerosis do not occur until the plaque severely narrows or blocks the artery. Treatment starts with lifestyle changes and may include medicines. In some cases, surgery is needed. Get help right away if you have any symptoms of a heart attack or stroke. This information is not intended to replace advice given to you by your health care provider. Make sure you discuss any questions you have with your health care provider. Document Revised: 08/28/2020 Document Reviewed: 08/28/2020 Elsevier Patient Education  2024 [employer].

## 2024-01-04 NOTE — Assessment & Plan Note (Signed)
 CMET and lipid profile today Encouraged her to consume a low fat diet Continue rosuvastatin  10 mg daily and apixiban 5 mg BID

## 2024-01-04 NOTE — Assessment & Plan Note (Signed)
 TSH and free T4 today We will adjust levothyroxine  50 mcg daily if needed based on labs

## 2024-01-04 NOTE — Assessment & Plan Note (Signed)
 Encourage weight loss as this can help reduce sleep apnea symptoms Continue CPAP use

## 2024-01-04 NOTE — Assessment & Plan Note (Signed)
 Controlled on HCTZ 12.5 mg and metoprolol  25 mg BID Reinforced DASH diet and exercise for weight loss C-Met today

## 2024-01-04 NOTE — Assessment & Plan Note (Signed)
 Continue metoprolol  25 mg BID and apixivan 5 mg BID as previously prescribed She will continue to follow with cardiology

## 2024-01-04 NOTE — Assessment & Plan Note (Signed)
 Avoid foods/medications that trigger reflux Encourage weight loss as this can help reduce reflux symptoms Continue omeprazole  20 mg daily

## 2024-01-04 NOTE — Assessment & Plan Note (Signed)
In remission CMET today

## 2024-01-04 NOTE — Progress Notes (Signed)
 Subjective:    Patient ID: Bettie Capistran, female    DOB: 1950/10/26, 73 y.o.   MRN: [id number]  HPI  Patient presents to clinic today for follow-up of chronic conditions.  HTN: Her BP today is 122/74.  She is taking HCTZ and metoprolol  as prescribed.  ECG from 06/2023 reviewed.  Hypothyroidism: She denies any issues on her current dose of levothyroxine .  She does not follow with endocrinology.  OSA: She averages 8 hours of sleep per night with the use of her CPAP.  Sleep study from 08/2018 reviewed.  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  She no longer follows with the liver care clinic.  A-fib: Managed with metoprolol  and apixaban  as prescribed.  ECG from 06/2023 reviewed.  She follows with cardiology.  HLD with aortic atherosclerosis: Her last LDL was 44, triglycerides 68, 03/2023.  She denies myalgias on rosuvastatin .  She is taking apixaban  as well.  She does not consume a low-fat diet.  GERD: Triggered by NSAID use.  She denies breakthrough on omeprazole .  There is no upper GI on file.  OA: Mainly in her left knee.  She is taking celebrex  as prescribed with good relief of symptoms.  She does not follow with orthopedics.  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  She does not follow with GYN.  Review of Systems     Past Medical History:  Diagnosis Date  . Anxiety 11/25/2015  . Atrial septal defect   . Chest pain 03/11/2018  . Clinical depression 11/25/2015  . Gravida 2 para 2 11/25/2015   2.    . Heart disease   . Hypertension   . Thyroid  disease     Current Outpatient Medications  Medication Sig Dispense Refill  . albuterol  (VENTOLIN  HFA) 108 (90 Base) MCG/ACT inhaler Inhale 1-2 puffs into the lungs every 6 (six) hours as needed for wheezing or shortness of breath. 8 g 1  . apixaban  (ELIQUIS ) 5 MG TABS tablet TAKE 1 TABLET(5 MG) BY MOUTH TWICE DAILY 180 tablet 3  . celecoxib  (CELEBREX ) 100 MG capsule Take 1 capsule (100 mg  total) by mouth 2 (two) times daily. (Patient not taking: Reported on 12/17/2023) 180 capsule 1  . conjugated estrogens  (PREMARIN ) vaginal cream Place vaginally at bedtime. 30 g 5  . diphenhydrAMINE (BENADRYL) 12.5 MG chewable tablet Chew 12.5 mg by mouth as needed for sleep.    . hydrochlorothiazide  (HYDRODIURIL ) 25 MG tablet TAKE 1 TABLET(25 MG) BY MOUTH DAILY (Patient taking differently: Take 12.5 mg by mouth daily. TAKE 1 TABLET(25 MG) BY MOUTH DAILY) 90 tablet 1  . levothyroxine  (SYNTHROID ) 50 MCG tablet TAKE 1 TABLET BY MOUTH BEFORE BREAKFAST 90 tablet 1  . Magnesium  100 MG TABS Take 100 mg by mouth daily.    . Melatonin-Pyridoxine 5-10 MG TBCR Take by mouth.    . metoprolol  succinate (TOPROL -XL) 25 MG 24 hr tablet TAKE 1 TABLET BY MOUTH TWICE DAILY. TAKEWITH OR IMMEDIATELY FOLLOWING A MEAL 180 tablet 3  . nitrofurantoin , macrocrystal-monohydrate, (MACROBID ) 100 MG capsule Take 1 capsule (100 mg total) by mouth 2 (two) times daily. 10 capsule 0  . omeprazole  (PRILOSEC ) 20 MG capsule Take 1 capsule (20 mg total) by mouth daily. 90 capsule 1  . rosuvastatin  (CRESTOR ) 10 MG tablet Take 1 tablet (10 mg total) by mouth daily. 90 tablet 3  . tirzepatide  (ZEPBOUND ) 12.5 MG/0.5ML Pen Inject 12.5 mg into the skin once a week. 2 mL 5   No current facility-administered  medications for this visit.    No Known Allergies  Family History  Problem Relation Age of Onset  . Cancer Mother        melanoma  . Heart disease Father   . Heart attack Father   . Breast cancer Maternal Grandmother        Breast Cancer    Social History   Socioeconomic History  . Marital status: Married    Spouse name: Nadie Fiumara  . Number of children: Not on file  . Years of education: Charity fundraiser  . Highest education level: Not on file  Occupational History  . 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 Smoking status: Former  . Smokeless tobacco:  Former  Advertising account planner  . Vaping status: Never Used  Substance and Sexual Activity  . Alcohol use: No  . Drug use: No  . Sexual activity: Not on file  Other Topics Concern  . Not on file  Social History Narrative  . Not on file   Social Drivers of Health   Financial Resource Strain: Not on file  Food Insecurity: Not on file  Transportation Needs: Not on file  Physical Activity: Not on file  Stress: Not on file  Social Connections: Not on file  Intimate Partner Violence: Not on file     Constitutional: Denies fever, malaise, fatigue, headache or abrupt weight changes.  HEENT: Denies eye pain, eye redness, ear pain, ringing in the ears, wax buildup, runny nose, nasal congestion, bloody nose, or sore throat. Respiratory: Denies difficulty breathing, shortness of breath, cough or sputum production.   Cardiovascular: Denies chest pain, chest tightness, palpitations or swelling in the hands or feet.  Gastrointestinal:  Pt reports nausea (after mounjaro  dose). Denies abdominal pain, bloating, constipation, diarrhea or blood in the stool.  GU: Patient reports vaginal dryness.  Denies urgency, frequency, pain with urination, burning sensation, blood in urine, odor or discharge. Musculoskeletal: Pt reports left knee pain. Denies decrease in range of motion, difficulty with gait, muscle pain or joint swelling.  Skin: Denies redness, rashes, lesions or ulcercations.  Neurological: Pt reports intermittent dizziness (after mounjaro  dose). Denies difficulty with memory, difficulty with speech or problems with balance and coordination.  Psych: Denies anxiety, depression, SI/HI.  No other specific complaints in a complete review of systems (except as listed in HPI above).  Objective:   Physical Exam   There were no vitals taken for this visit.  Wt Readings from Last 3 Encounters:  12/17/23 219 lb 8 oz (99.6 kg)  07/15/23 242 lb 6.4 oz (110 kg)  07/06/23 246 lb (111.6 kg)    General:  Appears her stated age, obese, in NAD. Skin: Warm, dry and intact.  Spider veins noted of bilateral lower extremity. HEENT: Head: normal shape and size; Eyes: sclera white, no icterus, conjunctiva pink, PERRLA and EOMs intact;  Neck:  Neck supple, trachea midline. No masses, lumps or thyromegaly present.  Cardiovascular: Normal rate and rhythm. S1,S2 noted.  No murmur, rubs or gallops noted. No JVD or BLE edema. No carotid bruits noted. Pulmonary/Chest: Normal effort and positive vesicular breath sounds. No respiratory distress. No wheezes, rales or ronchi noted.  Abdomen: Soft and nontender. Normal bowel sounds.  Musculoskeletal: Normal range of motion. No signs of joint swelling. No difficulty with gait.  Neurological: Alert and oriented. Cranial nerves II-XII grossly intact. Coordination normal.  Psychiatric: Mood and affect normal. Behavior is normal.  Judgment and thought content normal.    BMET    Component Value Date/Time   NA 136 03/09/2023 0906   NA 139 10/06/2019 0905   K 3.6 03/09/2023 0906   CL 98 03/09/2023 0906   CO2 30 03/09/2023 0906   GLUCOSE 84 03/09/2023 0906   BUN 11 03/09/2023 0906   BUN 10 10/06/2019 0905   CREATININE 0.80 03/09/2023 0906   CALCIUM  9.6 03/09/2023 0906   GFRNONAA 91 12/06/2020 0742   GFRAA 105 12/06/2020 0742    Lipid Panel     Component Value Date/Time   CHOL 124 03/09/2023 0906   CHOL 140 10/06/2019 0905   TRIG 68 03/09/2023 0906   HDL 66 03/09/2023 0906   HDL 70 10/06/2019 0905   CHOLHDL 1.9 03/09/2023 0906   LDLCALC 44 03/09/2023 0906    CBC    Component Value Date/Time   WBC 4.6 03/09/2023 0906   RBC 4.07 03/09/2023 0906   HGB 12.2 03/09/2023 0906   HGB 12.6 10/06/2019 0905   HCT 38.0 03/09/2023 0906   HCT 37.6 10/06/2019 0905   PLT 164 03/09/2023 0906   PLT 185 10/06/2019 0905   MCV 93.4 03/09/2023 0906   MCV 91 10/06/2019 0905   MCH 30.0 03/09/2023 0906   MCHC 32.1 03/09/2023 0906   RDW 13.6 03/09/2023 0906   RDW  13.2 10/06/2019 0905   LYMPHSABS 1.5 10/06/2019 0905   EOSABS 0.1 10/06/2019 0905   BASOSABS 0.1 10/06/2019 0905    Hgb A1C Lab Results  Component Value Date   HGBA1C 5.5 03/09/2023           Assessment & Plan:      RTC in 6 months for your annual exam [name], NP

## 2024-01-05 ENCOUNTER — Telehealth: Payer: Self-pay

## 2024-01-05 ENCOUNTER — Ambulatory Visit: Payer: Self-pay | Admitting: Internal Medicine

## 2024-01-05 LAB — CBC
HCT: 40.3 % (ref 35.0–45.0)
Hemoglobin: 13.1 g/dL (ref 11.7–15.5)
MCH: 31.5 pg (ref 27.0–33.0)
MCHC: 32.5 g/dL (ref 32.0–36.0)
MCV: 96.9 fL (ref 80.0–100.0)
MPV: 10.5 fL (ref 7.5–12.5)
Platelets: 202 Thousand/uL (ref 140–400)
RBC: 4.16 Million/uL (ref 3.80–5.10)
RDW: 12.9 % (ref 11.0–15.0)
WBC: 4.5 Thousand/uL (ref 3.8–10.8)

## 2024-01-05 LAB — COMPREHENSIVE METABOLIC PANEL WITH GFR
AG Ratio: 1.5 (calc) (ref 1.0–2.5)
ALT: 21 U/L (ref 6–29)
AST: 30 U/L (ref 10–35)
Albumin: 4.1 g/dL (ref 3.6–5.1)
Alkaline phosphatase (APISO): 65 U/L (ref 37–153)
BUN: 12 mg/dL (ref 7–25)
CO2: 31 mmol/L (ref 20–32)
Calcium: 9.9 mg/dL (ref 8.6–10.4)
Chloride: 99 mmol/L (ref 98–110)
Creat: 0.6 mg/dL (ref 0.60–1.00)
Globulin: 2.7 g/dL (ref 1.9–3.7)
Glucose, Bld: 87 mg/dL (ref 65–99)
Potassium: 3.6 mmol/L (ref 3.5–5.3)
Sodium: 137 mmol/L (ref 135–146)
Total Bilirubin: 0.9 mg/dL (ref 0.2–1.2)
Total Protein: 6.8 g/dL (ref 6.1–8.1)
eGFR: 97 mL/min/1.73m2 (ref >=60)

## 2024-01-05 LAB — LIPID PANEL
Cholesterol: 129 mg/dL (ref <200)
HDL: 61 mg/dL (ref >=50)
LDL Cholesterol (Calc): 54 mg/dL
Non-HDL Cholesterol (Calc): 68 mg/dL (ref <130)
Total CHOL/HDL Ratio: 2.1 (calc) (ref <5.0)
Triglycerides: 62 mg/dL (ref <150)

## 2024-01-05 LAB — T4, FREE: Free T4: 1.4 ng/dL (ref 0.8–1.8)

## 2024-01-05 LAB — TSH: TSH: 1.3 m[IU]/L (ref 0.40–4.50)

## 2024-01-05 LAB — HEMOGLOBIN A1C
Hgb A1c MFr Bld: 5.2 % (ref <5.7)
Mean Plasma Glucose: 103 mg/dL
eAG (mmol/L): 5.7 mmol/L

## 2024-01-05 MED ORDER — HYDROCHLOROTHIAZIDE 25 MG PO TABS: 12.5000 mg | ORAL_TABLET | Freq: Every day | ORAL | 1 refills | Status: AC

## 2024-01-05 NOTE — Telephone Encounter (Signed)
 refilled

## 2024-01-05 NOTE — Telephone Encounter (Signed)
 Requesting refills on Hydrochlorothiazide  12.5 mg. I see previous Rx was for 25 mg. Do you want me to send in the 25 mg or 12.5 mg? Thanks!

## 2024-01-05 NOTE — Addendum Note (Signed)
 Addended by: ANTONETTE ANGELINE ORN on: 01/05/2024 10:14 AM   Modules accepted: Orders

## 2024-01-07 NOTE — Progress Notes (Signed)
 [facility] [address] [place], [place] [address]  Pulmonary Sleep Medicine   Office Visit Note  Patient Name: Meghan Cole DOB: 04/01/1951 MRN [id number]    Chief Complaint: Obstructive Sleep Apnea visit  Brief History:  Meghan Cole is seen today for a follow up visit for CPAP@ 5 cmH2O. The patient has a 5 year history of sleep apnea. Patient is using PAP nightly.  The patient feels rested after sleeping with PAP.  The patient reports benefiting from PAP use. Reported sleepiness is  improved and the Epworth Sleepiness Score is 5 out of 24. The patient does take naps. The patient complains of the following: none.  The compliance download shows 100% compliance with an average use time of 7 hours 20 minutes. The AHI is 1.5.  The patient does not complain of limb movements disrupting sleep. Patient continues to require PAP therapy in order to eliminate sleep apnea.   ROS  General: (-) fever, (-) chills, (-) night sweat Nose and Sinuses: (-) nasal stuffiness or itchiness, (-) postnasal drip, (-) nosebleeds, (-) sinus trouble. Mouth and Throat: (-) sore throat, (-) hoarseness. Neck: (-) swollen glands, (-) enlarged thyroid , (-) neck pain. Respiratory: - cough, - shortness of breath, - wheezing. Neurologic: - numbness, - tingling. Psychiatric: - anxiety, - depression   Current Medication: Outpatient Encounter Medications as of 01/10/2024  Medication Sig   albuterol  (VENTOLIN  HFA) 108 (90 Base) MCG/ACT inhaler Inhale 1-2 puffs into the lungs every 6 (six) hours as needed for wheezing or shortness of breath.   apixaban  (ELIQUIS ) 5 MG TABS tablet TAKE 1 TABLET(5 MG) BY MOUTH TWICE DAILY   celecoxib  (CELEBREX ) 100 MG capsule Take 1 capsule (100 mg total) by mouth 2 (two) times daily.   conjugated estrogens  (PREMARIN ) vaginal cream Place vaginally at bedtime.   diphenhydrAMINE (BENADRYL) 12.5 MG chewable tablet Chew 12.5 mg by mouth as needed for sleep.   doxycycline (ADOXA) 100 MG  tablet Take 100 mg by mouth 2 (two) times daily.   hydrochlorothiazide  (HYDRODIURIL ) 25 MG tablet Take 0.5 tablets (12.5 mg total) by mouth daily.   levothyroxine  (SYNTHROID ) 50 MCG tablet TAKE 1 TABLET BY MOUTH BEFORE BREAKFAST   Magnesium  100 MG TABS Take 100 mg by mouth daily.   Melatonin-Pyridoxine 5-10 MG TBCR Take by mouth.   metoprolol  succinate (TOPROL -XL) 25 MG 24 hr tablet TAKE 1 TABLET BY MOUTH TWICE DAILY. TAKEWITH OR IMMEDIATELY FOLLOWING A MEAL   omeprazole  (PRILOSEC ) 20 MG capsule Take 1 capsule (20 mg total) by mouth daily.   rosuvastatin  (CRESTOR ) 10 MG tablet Take 1 tablet (10 mg total) by mouth daily.   tirzepatide  (ZEPBOUND ) 12.5 MG/0.5ML Pen Inject 12.5 mg into the skin once a week.   No facility-administered encounter medications on file as of 01/10/2024.    Surgical History: Past Surgical History:  Procedure Laterality Date   ATRIAL SEPTAL DEFECT(ASD) CLOSURE     CARDIOVERSION N/A 10/13/2017   Procedure: CARDIOVERSION;  Surgeon: Perla Evalene PARAS, MD;  Location: [facility] ORS;  Service: Cardiovascular;  Laterality: N/A;   COLONOSCOPY WITH PROPOFOL      2011   EXPLORATION POST OPERATIVE OPEN HEART      Medical History: Past Medical History:  Diagnosis Date   Anxiety 11/25/2015   Atrial septal defect    Chest pain 03/11/2018   Clinical depression 11/25/2015   Gravida 2 para 2 11/25/2015   2.     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)  Tobacco Use   Smoking status: Former   Smokeless tobacco: Former  Building services engineer status: Never Used  Substance and  Sexual Activity   Alcohol use: No   Drug use: No   Sexual activity: Not on file  Other Topics Concern   Not on file  Social History Narrative   Not on file   Social Drivers of Corporate investment banker Strain: Not on file  Food Insecurity: Not on file  Transportation Needs: Not on file  Physical Activity: Not on file  Stress: Not on file  Social Connections: Not on file  Intimate Partner Violence: Not on file    Vital Signs: There were no vitals taken for this visit. There is no height or weight on file to calculate BMI.    Examination: General Appearance: The patient is well-developed, well-nourished, and in no distress. Neck Circumference: 39 cm Skin: Gross inspection of skin unremarkable. Head: normocephalic, no gross deformities. Eyes: no gross deformities noted. ENT: ears appear grossly normal Neurologic: Alert and oriented. No involuntary movements.  STOP BANG RISK ASSESSMENT S (snore) Have you been told that you snore?     NO   T (tired) Are you often tired, fatigued, or sleepy during the day?   NO  O (obstruction) Do you stop breathing, choke, or gasp during sleep? NO   P (pressure) Do you have or are you being treated for high blood pressure? YES   B (BMI) Is your body index greater than 35 kg/m? YES   A (age) Are you 71 years old or older? YES   N (neck) Do you have a neck circumference greater than 16 inches?   NO   G (gender) Are you a female? NO   TOTAL STOP/BANG "YES" ANSWERS 3       A STOP-Bang score of 2 or less is considered low risk, and a score of 5 or more is high risk for having either moderate or severe OSA. For people who score 3 or 4, doctors may need to perform further assessment to determine how likely they are to have OSA.         EPWORTH SLEEPINESS SCALE:  Scale:  (0)= no chance of dozing; (1)= slight chance of dozing; (2)= moderate chance of dozing; (3)= high chance of dozing  Chance  Situtation    Sitting and reading: 1     Watching TV: 1    Sitting Inactive in public: 0    As a passenger in car: 0      Lying down to rest: 3    Sitting and talking: 0    Sitting quielty after lunch: 0    In a car, stopped in traffic: 0   TOTAL SCORE:   5 out of 24    SLEEP STUDIES:  PSG - 04/2018 - AHI 21/hr, Supine REM AHI 56/hr, min Sp02 70% Titration - 04/2018 - CPAP @ 5cmH20   CPAP COMPLIANCE DATA:  Date Range: 11/08/2023-01/06/2024  Average Daily Use: 7 hours 20 minutes  Median Use: 7 hours  27 minutes  Compliance for > 4 Hours: 100%  AHI: 1.5 respiratory events per hour  Days Used: 60/60 days  Mask Leak: 8.8  95th Percentile Pressure: 5         LABS: Recent Results (from the past 2160 hours)  POCT Urinalysis Dipstick     Status: Abnormal   Collection Time: 12/17/23  4:00 PM  Result Value Ref Range   Color, UA yellow    Clarity, UA clear    Glucose, UA Negative Negative   Bilirubin, UA negative    Ketones, UA negative    Spec Grav, UA 1.010 1.010 - 1.025   Blood, UA small    pH, UA 5.0 5.0 - 8.0   Protein, UA Negative Negative   Urobilinogen, UA 0.2 0.2 or 1.0 E.U./dL   Nitrite, UA negative    Leukocytes, UA Trace (A) Negative   Appearance     Odor positive   Urine Culture     Status: None   Collection Time: 12/17/23  4:03 PM   Specimen: Urine  Result Value Ref Range   MICRO NUMBER: [id number]    SPECIMEN QUALITY: Adequate    Sample Source URINE, CLEAN CATCH    STATUS: FINAL    Result:      Less than 10,000 CFU/mL of single Gram negative organism isolated. No further testing will be performed. If clinically indicated, recollection using a method to minimize contamination, with prompt transfer to Urine Culture Transport Tube, is recommended.  CBC     Status: None   Collection Time: 01/04/24  8:30 AM  Result Value Ref Range   WBC 4.5 3.8 - 10.8 Thousand/uL   RBC 4.16 3.80 - 5.10 Million/uL   Hemoglobin 13.1 11.7 - 15.5 g/dL   HCT 59.6 64.9 - 54.9 %   MCV 96.9 80.0 -  100.0 fL   MCH 31.5 27.0 - 33.0 pg   MCHC 32.5 32.0 - 36.0 g/dL    Comment: For adults, a slight decrease in the calculated MCHC value (in the range of 30 to 32 g/dL) is most likely not clinically significant; however, it should be interpreted with caution in correlation with other red cell parameters and the patient's clinical condition.    RDW 12.9 11.0 - 15.0 %   Platelets 202 140 - 400 Thousand/uL   MPV 10.5 7.5 - 12.5 fL  Comprehensive metabolic panel with GFR     Status: None   Collection Time: 01/04/24  8:30 AM  Result Value Ref Range   Glucose, Bld 87 65 - 99 mg/dL    Comment: .            Fasting reference interval .    BUN 12 7 - 25 mg/dL   Creat 9.39 9.39 - 8.99 mg/dL   eGFR 97 > OR = 60 fO/fpw/8.26f7   BUN/Creatinine Ratio SEE NOTE: 6 - 22 (calc)    Comment:    Not Reported: BUN and Creatinine are within    reference range. .    Sodium 137 135 - 146 mmol/L   Potassium 3.6 3.5 - 5.3 mmol/L   Chloride 99 98 - 110 mmol/L   CO2 31 20 - 32 mmol/L   Calcium  9.9 8.6 - 10.4 mg/dL   Total Protein 6.8 6.1 - 8.1 g/dL   Albumin 4.1 3.6 - 5.1 g/dL   Globulin 2.7 1.9 - 3.7 g/dL (calc)   AG Ratio 1.5 1.0 - 2.5 (calc)   Total Bilirubin 0.9 0.2 - 1.2 mg/dL  Alkaline phosphatase (APISO) 65 37 - 153 U/L   AST 30 10 - 35 U/L   ALT 21 6 - 29 U/L  TSH     Status: None   Collection Time: 01/04/24  8:30 AM  Result Value Ref Range   TSH 1.30 0.40 - 4.50 mIU/L  T4, free     Status: None   Collection Time: 01/04/24  8:30 AM  Result Value Ref Range   Free T4 1.4 0.8 - 1.8 ng/dL  Lipid panel     Status: None   Collection Time: 01/04/24  8:30 AM  Result Value Ref Range   Cholesterol 129 <200 mg/dL   HDL 61 > OR = 50 mg/dL   Triglycerides 62 <849 mg/dL   LDL Cholesterol (Calc) 54 mg/dL (calc)    Comment: Reference range: <100 . Desirable range <100 mg/dL for primary prevention;   <70 mg/dL for patients with CHD or diabetic patients  with > or = 2 CHD risk factors. SABRA LDL-C  is now calculated using the Martin-Hopkins  calculation, which is a validated novel method providing  better accuracy than the Friedewald equation in the  estimation of LDL-C.  Gladis APPLETHWAITE et al. SANDREA. 7986;689(80): 2061-2068  (http://education.[facility].com/faq/FAQ164)    Total CHOL/HDL Ratio 2.1 <5.0 (calc)   Non-HDL Cholesterol (Calc) 68 <869 mg/dL (calc)    Comment: For patients with diabetes plus 1 major ASCVD risk  factor, treating to a non-HDL-C goal of <100 mg/dL  (LDL-C of <29 mg/dL) is considered a therapeutic  option.   Hemoglobin A1c     Status: None   Collection Time: 01/04/24  8:30 AM  Result Value Ref Range   Hgb A1c MFr Bld 5.2 <5.7 %    Comment: For the purpose of screening for the presence of diabetes: . <5.7%       Consistent with the absence of diabetes 5.7-6.4%    Consistent with increased risk for diabetes             (prediabetes) > or =6.5%  Consistent with diabetes . This assay result is consistent with a decreased risk of diabetes. . Currently, no consensus exists regarding use of hemoglobin A1c for diagnosis of diabetes in children. . According to American Diabetes Association (ADA) guidelines, hemoglobin A1c <7.0% represents optimal control in non-pregnant diabetic patients. Different metrics may apply to specific patient populations.  Standards of Medical Care in Diabetes(ADA). .    Mean Plasma Glucose 103 mg/dL   eAG (mmol/L) 5.7 mmol/L    Radiology: DG Knee Complete 4 Views Left Result Date: 09/15/2021 CLINICAL DATA:  Chronic pain EXAM: LEFT KNEE - COMPLETE 4+ VIEW COMPARISON:  None. FINDINGS: No fracture or dislocation is seen. Small bony spurs seen in the medial and patellofemoral compartments. There is narrowing of joint space in the medial compartment. There is small effusion in the suprapatellar bursa. IMPRESSION: No recent fracture or dislocation is seen in the left knee. Degenerative changes are noted with bony spurs in the  medial and patellofemoral compartments. Small effusion is present in the suprapatellar bursa. Electronically Signed   By: Gearldine Mary M.D.   On: 09/15/2021 14:09    No results found.  No results found.    Assessment and Plan: Patient Active Problem List   Diagnosis Date Noted   Aortic atherosclerosis (HCC) 01/04/2024   GERD (gastroesophageal reflux disease) 12/23/2021   Osteoarthritis 12/23/2021   Mixed hyperlipidemia 12/05/2020   Vaginal atrophy 12/05/2020   Hepatitis C virus infection without hepatic  coma 03/11/2018   OSA (obstructive sleep apnea) 11/25/2017   Paroxysmal atrial fibrillation (HCC) 12/01/2016   Adult hypothyroidism 11/25/2015   Essential hypertension 11/25/2015   Class 1 obesity due to excess calories with body mass index (BMI) of 34.0 to 34.9 in adult     1. OSA (obstructive sleep apnea) (Primary) The patient does tolerate PAP and reports  benefit from PAP use. The patient was reminded how to cllean equipment and advised to replace supplies routinely. The patient was also counselled on weight loss. The compliance is excellent. The AHI is 1.5.   OSA on cpap- controlled. Continue with excellent compliance with pap. CPAP continues to be medically necessary to treat this patient's OSA. F/u one year.     2. CPAP (continuous positive airway pressure) dependence CPAP Counseling: had a lengthy discussion with the patient regarding the importance of PAP therapy in management of the sleep apnea. Patient appears to understand the risk factor reduction and also understands the risks associated with untreated sleep apnea. Patient will try to make a good faith effort to remain compliant with therapy. Also instructed the patient on proper cleaning of the device including the water must be changed daily if possible and use of distilled water is preferred. Patient understands that the machine should be regularly cleaned with appropriate recommended cleaning solutions that do  not damage the PAP machine for example given white vinegar and water rinses. Other methods such as ozone treatment may not be as good as these simple methods to achieve cleaning.   3. Obesity (BMI 30.0-34.9) Continue zepbound  per primary provider.    General Counseling: I have discussed the findings of the evaluation and examination with Meghan Cole.  I have also discussed any further diagnostic evaluation thatmay be needed or ordered today. Meghan Cole verbalizes understanding of the findings of todays visit. We also reviewed her medications today and discussed drug interactions and side effects including but not limited excessive drowsiness and altered mental states. We also discussed that there is always a risk not just to her but also people around her. she has been encouraged to call the office with any questions or concerns that should arise related to todays visit.  No orders of the defined types were placed in this encounter.       I have personally obtained a history, examined the patient, evaluated laboratory and imaging results, formulated the assessment and plan and placed orders. This patient was seen today by Lauraine Lay, PA-C in collaboration with Dr. Elfreda Bathe.   Elfreda DELENA Bathe, MD [facility] Diplomate ABMS Pulmonary Critical Care Medicine and Sleep Medicine

## 2024-01-10 ENCOUNTER — Ambulatory Visit (INDEPENDENT_AMBULATORY_CARE_PROVIDER_SITE_OTHER): Admitting: Internal Medicine

## 2024-01-10 VITALS — HR 75 | Resp 16 | Ht 67.0 in | Wt 216.0 lb

## 2024-01-10 DIAGNOSIS — G4733 Obstructive sleep apnea (adult) (pediatric): Secondary | ICD-10-CM | POA: Diagnosis not present

## 2024-01-10 DIAGNOSIS — E66811 Obesity, class 1: Secondary | ICD-10-CM

## 2024-01-10 DIAGNOSIS — Z9989 Dependence on other enabling machines and devices: Secondary | ICD-10-CM | POA: Diagnosis not present

## 2024-01-10 NOTE — Patient Instructions (Signed)

## 2024-01-21 ENCOUNTER — Ambulatory Visit
Admission: RE | Admit: 2024-01-21 | Discharge: 2024-01-21 | Disposition: A | Discharge status: Home / Self Care | Source: Ambulatory Visit | Attending: Internal Medicine | Admitting: Internal Medicine

## 2024-01-21 DIAGNOSIS — Z1231 Encounter for screening mammogram for malignant neoplasm of breast: Secondary | ICD-10-CM | POA: Diagnosis present

## 2024-01-25 ENCOUNTER — Other Ambulatory Visit: Payer: Self-pay | Admitting: Internal Medicine

## 2024-01-25 DIAGNOSIS — R928 Other abnormal and inconclusive findings on diagnostic imaging of breast: Secondary | ICD-10-CM

## 2024-01-28 ENCOUNTER — Ambulatory Visit
Admission: RE | Admit: 2024-01-28 | Discharge: 2024-01-28 | Disposition: A | Discharge status: Home / Self Care | Source: Ambulatory Visit | Attending: Internal Medicine | Admitting: Internal Medicine

## 2024-01-28 DIAGNOSIS — R928 Other abnormal and inconclusive findings on diagnostic imaging of breast: Secondary | ICD-10-CM | POA: Diagnosis present

## 2024-02-17 DIAGNOSIS — H0014 Chalazion left upper eyelid: Secondary | ICD-10-CM | POA: Diagnosis not present

## 2024-02-21 DIAGNOSIS — G4733 Obstructive sleep apnea (adult) (pediatric): Secondary | ICD-10-CM | POA: Diagnosis not present

## 2024-02-24 DIAGNOSIS — F411 Generalized anxiety disorder: Secondary | ICD-10-CM | POA: Diagnosis not present

## 2024-03-06 ENCOUNTER — Other Ambulatory Visit: Payer: Self-pay | Admitting: Internal Medicine

## 2024-03-06 DIAGNOSIS — E039 Hypothyroidism, unspecified: Secondary | ICD-10-CM

## 2024-03-07 ENCOUNTER — Encounter: Payer: Self-pay | Admitting: Internal Medicine

## 2024-03-07 DIAGNOSIS — E039 Hypothyroidism, unspecified: Secondary | ICD-10-CM

## 2024-03-07 MED ORDER — LEVOTHYROXINE SODIUM 50 MCG PO TABS: ORAL_TABLET | ORAL | 1 refills | Status: AC

## 2024-03-08 NOTE — Telephone Encounter (Signed)
 Duplicate request, refilled 03/07/24.  Requested Prescriptions  Pending Prescriptions Disp Refills   levothyroxine  (SYNTHROID ) 50 MCG tablet [Pharmacy Med Name: LEVOTHYROXINE  0.05MG  ( ) TAB] 90 tablet 1    Sig: TAKE 1 TABLET BY MOUTH BEFORE BREAKFAST     Endocrinology:  Hypothyroid Agents Passed - 03/08/2024 10:46 AM      Passed - TSH in normal range and within 360 days    TSH  Date Value Ref Range Status  01/04/2024 1.30 0.40 - 4.50 mIU/L Final         Passed - Valid encounter within last 12 months    Recent Outpatient Visits           2 months ago Mixed hyperlipidemia   [place] [facility] [place], [place] W, NP   2 months ago Acute cystitis with hematuria   [place] [facility] [place] Marsa PARAS, DO   7 months ago Acute cystitis without hematuria   [place] [facility] [place], Angeline ORN, [place]

## 2024-03-09 DIAGNOSIS — H0014 Chalazion left upper eyelid: Secondary | ICD-10-CM | POA: Diagnosis not present

## 2024-03-17 ENCOUNTER — Encounter: Payer: Self-pay | Admitting: Internal Medicine

## 2024-03-23 DIAGNOSIS — F411 Generalized anxiety disorder: Secondary | ICD-10-CM | POA: Diagnosis not present

## 2024-04-20 DIAGNOSIS — F411 Generalized anxiety disorder: Secondary | ICD-10-CM | POA: Diagnosis not present

## 2024-04-22 DIAGNOSIS — G4733 Obstructive sleep apnea (adult) (pediatric): Secondary | ICD-10-CM | POA: Diagnosis not present

## 2024-05-18 ENCOUNTER — Telehealth: Payer: Self-pay | Admitting: Pharmacy Technician

## 2024-05-18 ENCOUNTER — Other Ambulatory Visit (HOSPITAL_COMMUNITY): Payer: Self-pay

## 2024-05-18 DIAGNOSIS — F411 Generalized anxiety disorder: Secondary | ICD-10-CM | POA: Diagnosis not present

## 2024-05-18 NOTE — Telephone Encounter (Signed)
 Pharmacy Patient Advocate Encounter  Received notification from [employer] that Prior Authorization for Zepbound  has been APPROVED from 03/08/24 to 06/07/25   PA #/Case ID/Reference #: [id number]

## 2024-05-18 NOTE — Telephone Encounter (Signed)
 Pharmacy Patient Advocate Encounter   Received notification from CoverMyMeds that prior authorization for Zpbound 15 mg is required/requested.   Insurance verification completed.   The patient is insured through [place].   Per test claim: PA required; PA submitted to above mentioned insurance via Latent Key/confirmation #/EOC [id number] Status is pending

## 2024-06-06 ENCOUNTER — Encounter: Payer: Self-pay | Admitting: Cardiovascular Disease

## 2024-06-06 DIAGNOSIS — I10 Essential (primary) hypertension: Secondary | ICD-10-CM

## 2024-06-09 MED ORDER — APIXABAN 5 MG PO TABS: ORAL_TABLET | ORAL | 0 refills | Status: DC

## 2024-06-09 MED ORDER — METOPROLOL SUCCINATE ER 25 MG PO TB24: ORAL_TABLET | ORAL | 0 refills | Status: DC

## 2024-06-26 NOTE — Progress Notes (Unsigned)
 Cardiology Office Note  Date:  06/27/2024   ID:  Rhyen, Mazariego [birth year], MRN [id number]  PCP:  Antonette Angeline ORN, NP   Chief Complaint  Patient presents with   12 month follow up     Doing well.     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  who presents for follow-up of her permanent atrial fibrillation  LOV 1/25 In follow-up today reports doing well Significant weight loss over the past year,  Weight 263 to 209   Only complaint is random left SI joint sharp pain, shooting pain up her left back feels like electric shock, nerve type sensation To 3 times a day  Not on zepbound , side effects If she does use it, prefers to stay at the lowest dose available  No sx from atrial fibrillation Rate controlled on metoprolol  Tolerating Eliquis , no bleeding  Doing NuStep for exercise  Remains on metoprolol  succinate 25 twice daily No leg edema, no PND orthopnea  Mother in [place]  died age 73 Lost son in Sep 28, 2019 CT coronary calcium  scoring 1. Coronary calcium  score of 430.  2.  An occluder device is seen in the interatrial septum.  EKG personally reviewed by myself on todays visit EKG Interpretation Date/Time:  Tuesday June 27 2024 10:11:54 EST Ventricular Rate:  77 PR Interval:    QRS Duration:  86 QT Interval:  424 QTC Calculation: 479 R Axis:   55  Text Interpretation: Atrial fibrillation Nonspecific ST and T wave abnormality Prolonged QT When compared with ECG of 22-Jun-2023 08:23, Nonspecific T wave abnormality, improved in Inferior leads T wave inversion no longer evident in Lateral leads Confirmed  by Perla Lye ([id number]) on 06/27/2024 10:35:03 AM     Other past medical history reviewed Echocardiogram October 2019 Left ventricle: The cavity size was normal. Wall thickness was   normal. Systolic function was normal. The estimated ejection   fraction was in the range of 55% to 60%. Wall motion was normal;   there were no regional wall motion abnormalities. Left   ventricular diastolic function parameters were normal. - Left atrium: The atrium was mildly dilated.   husband with bypass surgery and valve disease  Seen in the office 09/2017 Propafenone  increased up to 150 TID  With metoprolol  succinate 25 BID DCCV Oct 13, 2017  lasted only 6 days  EKG 10/2017: atrial fibrillation at [facility] Propafenone  stopped by [facility] 10/2017 [facility] EP Discussed other options for rhythm control including dofetilide loading vs ablation. Compliant with anticoagulation/eliquis   Did not tolerate flecainide , some nausea  Previous event monitor  Documenting atrial fibrillation Several episodes of atrial fibrillation both on May 19, first episode appeared regular and rapid, second episode or irregular consistent with atrial fibrillation Atrial fib last  Sat, 10/24/2016 10:45 Am, rate 154, up to 174 on tele,  Back to NSR 12:45 Had episode at 5 PM  Father with CAD, MI,  atrial fib  PMH:   has a past medical history of Anxiety (11/25/2015), Atrial septal defect, Chest pain (03/11/2018), Clinical depression (11/25/2015), Gravida 2 para 2 (11/25/2015), Heart disease, Hypertension, and Thyroid  disease.  PSH:  Past Surgical History:  Procedure Laterality Date   ATRIAL SEPTAL DEFECT(ASD) CLOSURE     CARDIOVERSION N/A 10/13/2017   Procedure: CARDIOVERSION;  Surgeon: Perla Evalene PARAS, MD;  Location: [facility] ORS;  Service: Cardiovascular;  Laterality: N/A;   COLONOSCOPY WITH PROPOFOL      2011   EXPLORATION POST OPERATIVE OPEN HEART    Lonell Clarity had no medications administered during this visit.    Allergies:    Patient has no known allergies.   Social History:  The patient  reports that she has quit smoking. She has quit using smokeless tobacco. She reports that she does not drink alcohol and does not use drugs.   Family History:   family history includes Breast cancer in her maternal grandmother; Cancer in her mother; Heart attack in her father; Heart disease in her father.   Review of Systems: Review of Systems  Constitutional: Negative.   HENT: Negative.    Respiratory: Negative.    Cardiovascular: Negative.   Gastrointestinal: Negative.   Musculoskeletal: Negative.   Neurological: Negative.   Psychiatric/Behavioral: Negative.    All other systems reviewed and are negative.   PHYSICAL EXAM: VS:  BP 118/66 (BP Location: Left Arm, Patient Position: Sitting, Cuff Size: Normal)   Pulse 77   Ht 5' 7 (1.702 m)   Wt 209 lb 2 oz (94.9 kg)   SpO2 98%   BMI 32.75 kg/m  , BMI Body mass index is 32.75 kg/m. Constitutional:  oriented to person, place, and time. No distress.  HENT:  Head: Grossly normal Eyes:  no discharge. No scleral icterus.  Neck: No JVD, no carotid bruits  Cardiovascular: Regular rate and rhythm, no murmurs appreciated Pulmonary/Chest: Clear to auscultation bilaterally, no wheezes or rails Abdominal: Soft.  no distension.  no tenderness.  Musculoskeletal: Normal range of motion Neurological:  normal muscle tone. Coordination normal. No atrophy Skin: Skin warm and dry Psychiatric: normal affect, pleasant   Recent Labs: 01/04/2024: ALT 21; BUN 12; Creat 0.60; Hemoglobin 13.1; Platelets 202; Potassium 3.6; Sodium 137; TSH 1.30    Lipid Panel Lab Results  Component Value Date   CHOL 129 01/04/2024   HDL 61 01/04/2024   LDLCALC 54 01/04/2024   TRIG 62 01/04/2024    Wt Readings from Last 3 Encounters:  06/27/24 209 lb 2 oz (94.9 kg)  01/10/24 216 lb (98 kg)  01/04/24 217 lb 9.6 oz (98.7 kg)     ASSESSMENT AND PLAN:   Permanent atrial fibrillation Dating  back to 2017 now permanent  Eliquis  5 twice daily, on metoprolol  succinate 25 twice daily Rate well-controlled, no bleeding  Chest pain Coronary calcification previously noted, cholesterol at goal Prior smoking history 30 years but stopped 20 years ago Currently with no symptoms of angina. No further workup at this time. Continue current medication regimen.  Essential hypertension - Blood pressure is well controlled on today's visit. No changes made to the medications.  Morbid obesity Significant weight loss 50 pounds or more over the past year Able to tolerate lowest dose of Zepbound , has not been taking this in a consistent fashion given good lifestyle, uses NuStep, calorie restriction with good results  Hyperlipidemia Cholesterol is at goal on the current lipid regimen. No changes to the medications were made.   Orders Placed This Encounter  Procedures   EKG 12-Lead     Signed, Velinda Perla, M.D., Ph.D. 06/27/2024  [facility] [place], [place] [phone]

## 2024-06-27 ENCOUNTER — Encounter: Payer: Self-pay | Admitting: Cardiovascular Disease

## 2024-06-27 ENCOUNTER — Ambulatory Visit: Attending: Cardiovascular Disease | Admitting: Cardiovascular Disease

## 2024-06-27 VITALS — BP 118/66 | HR 77 | Ht 67.0 in | Wt 209.1 lb

## 2024-06-27 DIAGNOSIS — I480 Paroxysmal atrial fibrillation: Secondary | ICD-10-CM

## 2024-06-27 DIAGNOSIS — I700 Atherosclerosis of aorta: Secondary | ICD-10-CM | POA: Diagnosis not present

## 2024-06-27 DIAGNOSIS — E7800 Pure hypercholesterolemia, unspecified: Secondary | ICD-10-CM

## 2024-06-27 DIAGNOSIS — G4733 Obstructive sleep apnea (adult) (pediatric): Secondary | ICD-10-CM | POA: Diagnosis not present

## 2024-06-27 DIAGNOSIS — I10 Essential (primary) hypertension: Secondary | ICD-10-CM | POA: Diagnosis not present

## 2024-06-27 DIAGNOSIS — E782 Mixed hyperlipidemia: Secondary | ICD-10-CM

## 2024-06-27 MED ORDER — METOPROLOL SUCCINATE ER 25 MG PO TB24: ORAL_TABLET | ORAL | 3 refills | Status: AC

## 2024-06-27 MED ORDER — APIXABAN 5 MG PO TABS: ORAL_TABLET | ORAL | 3 refills | Status: AC

## 2024-06-27 MED ORDER — ROSUVASTATIN CALCIUM 10 MG PO TABS: 10.0000 mg | ORAL_TABLET | Freq: Every day | ORAL | 3 refills | Status: AC

## 2024-06-27 NOTE — Patient Instructions (Signed)

## 2024-07-06 ENCOUNTER — Ambulatory Visit (INDEPENDENT_AMBULATORY_CARE_PROVIDER_SITE_OTHER): Admitting: Internal Medicine

## 2024-07-06 ENCOUNTER — Encounter: Payer: Self-pay | Admitting: Internal Medicine

## 2024-07-06 VITALS — BP 110/68 | Ht 67.0 in | Wt 207.4 lb

## 2024-07-06 DIAGNOSIS — E6609 Other obesity due to excess calories: Secondary | ICD-10-CM | POA: Diagnosis not present

## 2024-07-06 DIAGNOSIS — Z0001 Encounter for general adult medical examination with abnormal findings: Secondary | ICD-10-CM

## 2024-07-06 DIAGNOSIS — E782 Mixed hyperlipidemia: Secondary | ICD-10-CM

## 2024-07-06 DIAGNOSIS — Z6832 Body mass index (BMI) 32.0-32.9, adult: Secondary | ICD-10-CM | POA: Diagnosis not present

## 2024-07-06 DIAGNOSIS — R739 Hyperglycemia, unspecified: Secondary | ICD-10-CM | POA: Diagnosis not present

## 2024-07-06 DIAGNOSIS — B182 Chronic viral hepatitis C: Secondary | ICD-10-CM

## 2024-07-06 DIAGNOSIS — E039 Hypothyroidism, unspecified: Secondary | ICD-10-CM | POA: Diagnosis not present

## 2024-07-06 DIAGNOSIS — E66811 Obesity, class 1: Secondary | ICD-10-CM | POA: Diagnosis not present

## 2024-07-06 NOTE — Assessment & Plan Note (Signed)
 Encouraged diet and exercise for weight loss Continue tirzepatide  12.5 weekly as previously prescribed

## 2024-07-06 NOTE — Progress Notes (Signed)
 "  Subjective:    Patient ID: Meghan Cole, female    DOB: Dec 03, 1950, 74 y.o.   MRN: [id number]  HPI  Patient presents to clinic today for her annual exam.  Flu: 03/2024 Tetanus: unsure COVID: X 5 Pneumovax: 06/2021 Prevnar: 02/2019 Shingrix: 10/2016, 12/2016 Pap smear: 11/2017 Mammogram: 01/2024 Bone density: never Colon screening: 08/2022, Cologuard Vision screening: annually Dentist: biannually  Diet: She does eat meat. She consumes fruits and veggies. She does eat some fried foods. She drinks mostly water. Exercise: Elliptical, walking  Review of Systems    Past Medical History:  Diagnosis Date   Anxiety 11/25/2015   Atrial septal defect    Chest pain 03/11/2018   Clinical depression 11/25/2015   Gravida 2 para 2 11/25/2015   2.     Heart disease    Hypertension    Thyroid  disease     Current Outpatient Medications  Medication Sig Dispense Refill   albuterol  (VENTOLIN  HFA) 108 (90 Base) MCG/ACT inhaler Inhale 1-2 puffs into the lungs every 6 (six) hours as needed for wheezing or shortness of breath. 8 g 1   apixaban  (ELIQUIS ) 5 MG TABS tablet TAKE 1 TABLET(5 MG) BY MOUTH TWICE DAILY 180 tablet 3   celecoxib  (CELEBREX ) 100 MG capsule Take 1 capsule (100 mg total) by mouth 2 (two) times daily. (Patient not taking: Reported on 06/27/2024) 180 capsule 1   conjugated estrogens  (PREMARIN ) vaginal cream Place vaginally at bedtime. 30 g 5   diphenhydrAMINE (BENADRYL) 12.5 MG chewable tablet Chew 12.5 mg by mouth as needed for sleep.     doxycycline (ADOXA) 100 MG tablet Take 100 mg by mouth 2 (two) times daily.     hydrochlorothiazide  (HYDRODIURIL ) 25 MG tablet Take 0.5 tablets (12.5 mg total) by mouth daily. 45 tablet 1   levothyroxine  (SYNTHROID ) 50 MCG tablet TAKE 1 TABLET BY MOUTH BEFORE BREAKFAST 90 tablet 1   Magnesium  100 MG TABS Take 100 mg by mouth daily.     Melatonin-Pyridoxine 5-10 MG TBCR Take by mouth. (Patient not taking: Reported on 06/27/2024)     metoprolol   succinate (TOPROL -XL) 25 MG 24 hr tablet TAKE 1 TABLET BY MOUTH TWICE DAILY. TAKEWITH OR IMMEDIATELY FOLLOWING A MEAL 180 tablet 3   omeprazole  (PRILOSEC ) 20 MG capsule Take 1 capsule (20 mg total) by mouth daily. 90 capsule 1   rosuvastatin  (CRESTOR ) 10 MG tablet Take 1 tablet (10 mg total) by mouth daily. 90 tablet 3   tirzepatide  (ZEPBOUND ) 12.5 MG/0.5ML Pen Inject 12.5 mg into the skin once a week. 2 mL 5   No current facility-administered medications for this visit.    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)  Tobacco Use   Smoking status: Former   Smokeless tobacco: Former  Building Services Engineer status: Never Used  Substance and Sexual Activity  Alcohol use: No   Drug use: No   Sexual activity: Not on file  Other Topics Concern   Not on file  Social History Narrative   Not on file   Social Drivers of Health   Tobacco Use: Medium Risk (06/27/2024)   Patient History    Smoking Tobacco Use: Former    Smokeless Tobacco Use: Former    Passive Exposure: Not on Actuary Strain: Low Risk (06/29/2024)   Overall Financial Resource Strain (CARDIA)    Difficulty of Paying Living Expenses: Not hard at all  Food Insecurity: No Food Insecurity (06/29/2024)   Epic    Worried About Radiation Protection Practitioner of Food in the Last Year: Never true    Ran Out of Food in the Last Year: Never true  Transportation Needs: No Transportation Needs  (06/29/2024)   Epic    Lack of Transportation (Medical): No    Lack of Transportation (Non-Medical): No  Physical Activity: Sufficiently Active (06/29/2024)   Exercise Vital Sign    Days of Exercise per Week: 7 days    Minutes of Exercise per Session: 40 min  Stress: No Stress Concern Present (06/29/2024)   [employer] of Occupational Health - Occupational Stress Questionnaire    Feeling of Stress: Not at all  Social Connections: Moderately Isolated (06/29/2024)   Social Connection and Isolation Panel    Frequency of Communication with Friends and Family: Twice a week    Frequency of Social Gatherings with Friends and Family: Twice a week    Attends Religious Services: Never    Database Administrator or Organizations: No    Attends Engineer, Structural: Not on file    Marital Status: Married  Catering Manager Violence: Not on file  Depression (PHQ2-9): Low Risk (01/04/2024)   Depression (PHQ2-9)    PHQ-2 Score: 0  Alcohol Screen: Low Risk (12/23/2021)   Alcohol Screen    Last Alcohol Screening Score (AUDIT): 0  Housing: Low Risk (06/29/2024)   Epic    Unable to Pay for Housing in the Last Year: No    Number of Times Moved in the Last Year: 0    Homeless in the Last Year: No  Utilities: Not on file  Health Literacy: Not on file     Constitutional: Denies fever, malaise, fatigue, headache or abrupt weight changes.  HEENT: Denies eye pain, eye redness, ear pain, ringing in the ears, wax buildup, runny nose, nasal congestion, bloody nose, or sore throat. Respiratory: Denies difficulty breathing, shortness of breath, cough or sputum production.   Cardiovascular: Denies chest pain, chest tightness, palpitations or swelling in the hands or feet.  Gastrointestinal: Patient reports constipation.  Denies abdominal pain, bloating, diarrhea or blood in the stool.  GU: Pt reports vaginal dryness. Denies urgency, frequency, pain with urination, burning sensation, blood in urine,  odor or discharge. Musculoskeletal: Denies decrease in range of motion, difficulty with gait, muscle pain or joint pain or swelling.  Skin: Denies redness, rashes, lesions or ulcercations.  Neurological: Denies dizziness, difficulty with memory, difficulty with speech or problems with balance and coordination.  Psych: Denies anxiety, depression, SI/HI.  No other specific complaints in a complete review of systems (except as listed in HPI above).  Objective:   Physical Exam  BP 110/68 (BP Location: Left Arm, Patient Position: Sitting, Cuff Size: Large)   Ht 5' 7 (1.702 m)   Wt 207 lb 6.4 oz (94.1 kg)   BMI 32.48 kg/m  Wt Readings from Last 3 Encounters:  06/27/24 209 lb 2 oz (94.9 kg)  01/10/24 216 lb (98 kg)  01/04/24 217 lb 9.6 oz (98.7 kg)    General: Appears her stated age, obese, in NAD. Skin: Warm, dry and intact. No rashes, lesions or ulcerations noted. HEENT: Head: normal shape and size; Eyes: sclera white, no icterus, conjunctiva pink, PERRLA and EOMs intact;  Neck:  Neck supple, trachea midline. No masses, lumps or thyromegaly present.  Cardiovascular: Normal rate and rhythm. S1,S2 noted.  No murmur, rubs or gallops noted. No JVD or BLE edema. No carotid bruits noted. Pulmonary/Chest: Normal effort and positive vesicular breath sounds. No respiratory distress. No wheezes, rales or ronchi noted.  Abdomen: Normal bowel sounds.  Musculoskeletal: Kyphotic.  Strength 5/5 BUE/BLE.  No difficulty with gait.  Neurological: Alert and oriented. Cranial nerves II-XII grossly intact. Coordination normal.  Psychiatric: Mood and affect normal. Behavior is normal. Judgment and thought content normal.    BMET    Component Value Date/Time   NA 137 01/04/2024 0830   NA 139 10/06/2019 0905   K 3.6 01/04/2024 0830   CL 99 01/04/2024 0830   CO2 31 01/04/2024 0830   GLUCOSE 87 01/04/2024 0830   BUN 12 01/04/2024 0830   BUN 10 10/06/2019 0905   CREATININE 0.60 01/04/2024 0830    CALCIUM  9.9 01/04/2024 0830   GFRNONAA 91 12/06/2020 0742   GFRAA 105 12/06/2020 0742    Lipid Panel     Component Value Date/Time   CHOL 129 01/04/2024 0830   CHOL 140 10/06/2019 0905   TRIG 62 01/04/2024 0830   HDL 61 01/04/2024 0830   HDL 70 10/06/2019 0905   CHOLHDL 2.1 01/04/2024 0830   LDLCALC 54 01/04/2024 0830    CBC    Component Value Date/Time   WBC 4.5 01/04/2024 0830   RBC 4.16 01/04/2024 0830   HGB 13.1 01/04/2024 0830   HGB 12.6 10/06/2019 0905   HCT 40.3 01/04/2024 0830   HCT 37.6 10/06/2019 0905   PLT 202 01/04/2024 0830   PLT 185 10/06/2019 0905   MCV 96.9 01/04/2024 0830   MCV 91 10/06/2019 0905   MCH 31.5 01/04/2024 0830   MCHC 32.5 01/04/2024 0830   RDW 12.9 01/04/2024 0830   RDW 13.2 10/06/2019 0905   LYMPHSABS 1.5 10/06/2019 0905   EOSABS 0.1 10/06/2019 0905   BASOSABS 0.1 10/06/2019 0905    Hgb A1C Lab Results  Component Value Date   HGBA1C 5.2 01/04/2024           Assessment & Plan:   Preventative Health Maintenance:  Flu shot UTD She declines tetanus for financial reasons, advised if she gets bit or cut to get this done at the pharmacy Encouraged her to get her COVID booster Pneumovax and Prevnar UTD Shingrix UTD She no longer wants to screen for cervical cancer Mammogram UTD She does not want to screen for osteoporosis Colon screening UTD Encouraged her to consume a balanced diet and exercise regimen Advised her seeing eye doctor and dentist annually We will check CBC, c-Met, TSH, Free T4, lipid and A1c today  RTC in 6 months, follow-up chronic conditions [name], NP  "

## 2024-07-06 NOTE — Assessment & Plan Note (Signed)
Metabolic panel today. 

## 2024-07-06 NOTE — Patient Instructions (Signed)
 Health Maintenance for Postmenopausal Women Menopause is a normal process in which your ability to get pregnant comes to an end. This process happens slowly over many months or years, usually between the ages of 76 and 38. Menopause is complete when you have missed your menstrual period for 12 months. It is important to talk with your health care provider about some of the most common conditions that affect women after menopause (postmenopausal women). These include heart disease, cancer, and bone loss (osteoporosis). Adopting a healthy lifestyle and getting preventive care can help to promote your health and wellness. The actions you take can also lower your chances of developing some of these common conditions. What are the signs and symptoms of menopause? During menopause, you may have the following symptoms: Hot flashes. These can be moderate or severe. Night sweats. Decrease in sex drive. Mood swings. Headaches. Tiredness (fatigue). Irritability. Memory problems. Problems falling asleep or staying asleep. Talk with your health care provider about treatment options for your symptoms. Do I need hormone replacement therapy? Hormone replacement therapy is effective in treating symptoms that are caused by menopause, such as hot flashes and night sweats. Hormone replacement carries certain risks, especially as you become older. If you are thinking about using estrogen or estrogen with progestin, discuss the benefits and risks with your health care provider. How can I reduce my risk for heart disease and stroke? The risk of heart disease, heart attack, and stroke increases as you age. One of the causes may be a change in the body's hormones during menopause. This can affect how your body uses dietary fats, triglycerides, and cholesterol. Heart attack and stroke are medical emergencies. There are many things that you can do to help prevent heart disease and stroke. Watch your blood pressure High  blood pressure causes heart disease and increases the risk of stroke. This is more likely to develop in people who have high blood pressure readings or are overweight. Have your blood pressure checked: Every 3-5 years if you are 32-23 years of age. Every year if you are 31 years old or older. Eat a healthy diet  Eat a diet that includes plenty of vegetables, fruits, low-fat dairy products, and lean protein. Do not eat a lot of foods that are high in solid fats, added sugars, or sodium. Get regular exercise Get regular exercise. This is one of the most important things you can do for your health. Most adults should: Try to exercise for at least 150 minutes each week. The exercise should increase your heart rate and make you sweat (moderate-intensity exercise). Try to do strengthening exercises at least twice each week. Do these in addition to the moderate-intensity exercise. Spend less time sitting. Even light physical activity can be beneficial. Other tips Work with your health care provider to achieve or maintain a healthy weight. Do not use any products that contain nicotine or tobacco. These products include cigarettes, chewing tobacco, and vaping devices, such as e-cigarettes. If you need help quitting, ask your health care provider. Know your numbers. Ask your health care provider to check your cholesterol and your blood sugar (glucose). Continue to have your blood tested as directed by your health care provider. Do I need screening for cancer? Depending on your health history and family history, you may need to have cancer screenings at different stages of your life. This may include screening for: Breast cancer. Cervical cancer. Lung cancer. Colorectal cancer. What is my risk for osteoporosis? After menopause, you may be  at increased risk for osteoporosis. Osteoporosis is a condition in which bone destruction happens more quickly than new bone creation. To help prevent osteoporosis or  the bone fractures that can happen because of osteoporosis, you may take the following actions: If you are 24-54 years old, get at least 1,000 mg of calcium and at least 600 international units (IU) of vitamin D  per day. If you are older than age 75 but younger than age 30, get at least 1,200 mg of calcium and at least 600 international units (IU) of vitamin D  per day. If you are older than age 8, get at least 1,200 mg of calcium and at least 800 international units (IU) of vitamin D  per day. Smoking and drinking excessive alcohol increase the risk of osteoporosis. Eat foods that are rich in calcium and vitamin D , and do weight-bearing exercises several times each week as directed by your health care provider. How does menopause affect my mental health? Depression may occur at any age, but it is more common as you become older. Common symptoms of depression include: Feeling depressed. Changes in sleep patterns. Changes in appetite or eating patterns. Feeling an overall lack of motivation or enjoyment of activities that you previously enjoyed. Frequent crying spells. Talk with your health care provider if you think that you are experiencing any of these symptoms. General instructions See your health care provider for regular wellness exams and vaccines. This may include: Scheduling regular health, dental, and eye exams. Getting and maintaining your vaccines. These include: Influenza vaccine. Get this vaccine each year before the flu season begins. Pneumonia vaccine. Shingles vaccine. Tetanus, diphtheria, and pertussis (Tdap) booster vaccine. Your health care provider may also recommend other immunizations. Tell your health care provider if you have ever been abused or do not feel safe at home. Summary Menopause is a normal process in which your ability to get pregnant comes to an end. This condition causes hot flashes, night sweats, decreased interest in sex, mood swings, headaches, or lack  of sleep. Treatment for this condition may include hormone replacement therapy. Take actions to keep yourself healthy, including exercising regularly, eating a healthy diet, watching your weight, and checking your blood pressure and blood sugar levels. Get screened for cancer and depression. Make sure that you are up to date with all your vaccines. This information is not intended to replace advice given to you by your health care provider. Make sure you discuss any questions you have with your health care provider. Document Revised: 10/14/2020 Document Reviewed: 10/14/2020 Elsevier Patient Education  2024 [employer].

## 2024-07-07 ENCOUNTER — Other Ambulatory Visit: Payer: Self-pay | Admitting: Internal Medicine

## 2024-07-07 ENCOUNTER — Ambulatory Visit: Payer: Self-pay | Admitting: Internal Medicine

## 2024-07-07 DIAGNOSIS — R928 Other abnormal and inconclusive findings on diagnostic imaging of breast: Secondary | ICD-10-CM

## 2024-07-07 LAB — COMPREHENSIVE METABOLIC PANEL WITH GFR
AG Ratio: 1.5 (calc) (ref 1.0–2.5)
ALT: 15 U/L (ref 6–29)
AST: 22 U/L (ref 10–35)
Albumin: 4 g/dL (ref 3.6–5.1)
Alkaline phosphatase (APISO): 59 U/L (ref 37–153)
BUN: 15 mg/dL (ref 7–25)
CO2: 33 mmol/L — ABNORMAL HIGH (ref 20–32)
Calcium: 9.6 mg/dL (ref 8.6–10.4)
Chloride: 99 mmol/L (ref 98–110)
Creat: 0.7 mg/dL (ref 0.60–1.00)
Globulin: 2.7 g/dL (ref 1.9–3.7)
Glucose, Bld: 78 mg/dL (ref 65–99)
Potassium: 3.5 mmol/L (ref 3.5–5.3)
Sodium: 139 mmol/L (ref 135–146)
Total Bilirubin: 0.9 mg/dL (ref 0.2–1.2)
Total Protein: 6.7 g/dL (ref 6.1–8.1)
eGFR: 92 mL/min/{1.73_m2}

## 2024-07-07 LAB — TSH: TSH: 1.56 m[IU]/L (ref 0.40–4.50)

## 2024-07-07 LAB — CBC
HCT: 39 % (ref 35.9–46.0)
Hemoglobin: 12.9 g/dL (ref 11.7–15.5)
MCH: 30.9 pg (ref 27.0–33.0)
MCHC: 33.1 g/dL (ref 31.6–35.4)
MCV: 93.5 fL (ref 81.4–101.7)
MPV: 10.7 fL (ref 7.5–12.5)
Platelets: 204 10*3/uL (ref 140–400)
RBC: 4.17 Million/uL (ref 3.80–5.10)
RDW: 12.7 % (ref 11.0–15.0)
WBC: 4.7 10*3/uL (ref 3.8–10.8)

## 2024-07-07 LAB — LIPID PANEL
Cholesterol: 133 mg/dL
HDL: 65 mg/dL
LDL Cholesterol (Calc): 54 mg/dL
Non-HDL Cholesterol (Calc): 68 mg/dL
Total CHOL/HDL Ratio: 2 (calc)
Triglycerides: 61 mg/dL

## 2024-07-07 LAB — T4, FREE: Free T4: 1.3 ng/dL (ref 0.8–1.8)

## 2024-07-07 LAB — HEMOGLOBIN A1C
Hgb A1c MFr Bld: 5 %
Mean Plasma Glucose: 97 mg/dL
eAG (mmol/L): 5.4 mmol/L

## 2024-08-17 ENCOUNTER — Encounter

## 2025-01-02 ENCOUNTER — Ambulatory Visit: Admitting: Internal Medicine
